# Patient Record
Sex: Female | Born: 1960 | Race: Black or African American | Hispanic: No | Smoking: Never smoker
Health system: Southern US, Community
[De-identification: ages and names within clinical notes are randomized; demographics above are authoritative.]

## PROBLEM LIST (undated history)

## (undated) DIAGNOSIS — G8929 Other chronic pain: Secondary | ICD-10-CM

## (undated) DIAGNOSIS — Z86718 Personal history of other venous thrombosis and embolism: Secondary | ICD-10-CM

## (undated) DIAGNOSIS — I10 Essential (primary) hypertension: Secondary | ICD-10-CM

## (undated) DIAGNOSIS — G4730 Sleep apnea, unspecified: Secondary | ICD-10-CM

## (undated) DIAGNOSIS — G43909 Migraine, unspecified, not intractable, without status migrainosus: Secondary | ICD-10-CM

## (undated) DIAGNOSIS — R0989 Other specified symptoms and signs involving the circulatory and respiratory systems: Secondary | ICD-10-CM

## (undated) DIAGNOSIS — G35 Multiple sclerosis: Secondary | ICD-10-CM

## (undated) DIAGNOSIS — E119 Type 2 diabetes mellitus without complications: Secondary | ICD-10-CM

## (undated) DIAGNOSIS — M48061 Spinal stenosis, lumbar region without neurogenic claudication: Secondary | ICD-10-CM

## (undated) DIAGNOSIS — M549 Dorsalgia, unspecified: Secondary | ICD-10-CM

## (undated) HISTORY — DX: Personal history of other venous thrombosis and embolism: Z86.718

## (undated) HISTORY — PX: FOOT SURGERY: SHX648

## (undated) HISTORY — PX: EYE SURGERY: SHX253

## (undated) HISTORY — DX: Spinal stenosis, lumbar region without neurogenic claudication: M48.061

## (undated) HISTORY — PX: ECTOPIC PREGNANCY SURGERY: SHX613

## (undated) HISTORY — DX: Migraine, unspecified, not intractable, without status migrainosus: G43.909

## (undated) HISTORY — DX: Essential (primary) hypertension: I10

## (undated) HISTORY — DX: Sleep apnea, unspecified: G47.30

## (undated) HISTORY — DX: Type 2 diabetes mellitus without complications: E11.9

## (undated) HISTORY — DX: Multiple sclerosis: G35

## (undated) HISTORY — DX: Other specified symptoms and signs involving the circulatory and respiratory systems: R09.89

## (undated) HISTORY — PX: OOPHORECTOMY: SHX86

## (undated) HISTORY — PX: ABDOMINAL HYSTERECTOMY: SHX81

## (undated) HISTORY — PX: BACK SURGERY: SHX140

---

## 2004-06-01 ENCOUNTER — Ambulatory Visit: Payer: Self-pay | Admitting: *Deleted

## 2004-10-11 ENCOUNTER — Emergency Department: Payer: Self-pay | Admitting: Unknown Physician Specialty

## 2004-12-11 ENCOUNTER — Emergency Department: Payer: Self-pay | Admitting: Emergency Medicine

## 2005-04-04 ENCOUNTER — Ambulatory Visit: Payer: Self-pay | Admitting: Neurology

## 2005-07-12 ENCOUNTER — Ambulatory Visit: Payer: Self-pay | Admitting: Unknown Physician Specialty

## 2005-10-07 ENCOUNTER — Emergency Department: Payer: Self-pay | Admitting: Emergency Medicine

## 2005-12-26 ENCOUNTER — Ambulatory Visit: Payer: Self-pay | Admitting: Nurse Practitioner

## 2006-04-11 ENCOUNTER — Emergency Department: Payer: Self-pay | Admitting: Emergency Medicine

## 2006-04-16 ENCOUNTER — Emergency Department: Payer: Self-pay | Admitting: Emergency Medicine

## 2006-05-23 ENCOUNTER — Ambulatory Visit: Payer: Self-pay | Admitting: Nurse Practitioner

## 2006-06-14 ENCOUNTER — Emergency Department: Payer: Self-pay | Admitting: Unknown Physician Specialty

## 2006-06-14 ENCOUNTER — Other Ambulatory Visit: Payer: Self-pay

## 2006-07-16 ENCOUNTER — Other Ambulatory Visit: Payer: Self-pay

## 2006-07-16 ENCOUNTER — Emergency Department: Payer: Self-pay | Admitting: Unknown Physician Specialty

## 2006-07-18 ENCOUNTER — Ambulatory Visit: Payer: Self-pay | Admitting: Unknown Physician Specialty

## 2006-09-10 ENCOUNTER — Ambulatory Visit: Payer: Self-pay | Admitting: Neurology

## 2007-01-14 ENCOUNTER — Emergency Department: Payer: Self-pay | Admitting: Emergency Medicine

## 2007-01-17 ENCOUNTER — Ambulatory Visit: Payer: Self-pay | Admitting: Emergency Medicine

## 2007-02-25 IMAGING — CT CT HEAD WITHOUT CONTRAST
2 series · 16 of 30 positions shown, 20 images · non-contrast
Comparison: none

REASON FOR EXAM: AMS, rm 4
COMMENTS:

[Series 2: without · axial · non-contrast · 0.40mm/px · z∈[+780,+904]mm · 13 of 31 slices shown, 17 images]
[im 3/31  brain]
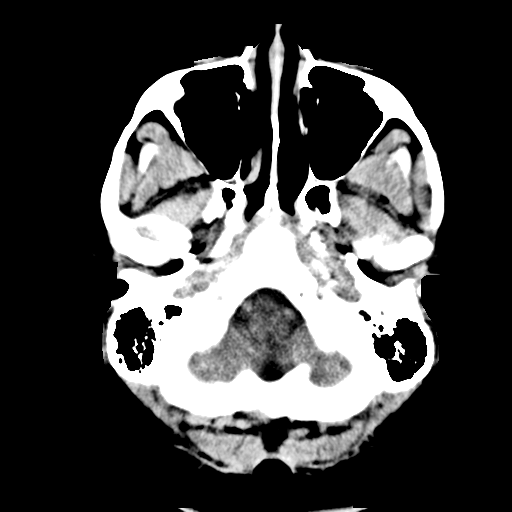
[im 3/31  bone]
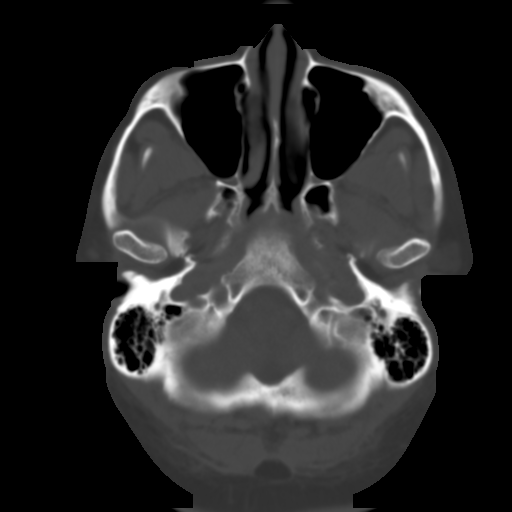
[im 5/31  brain]
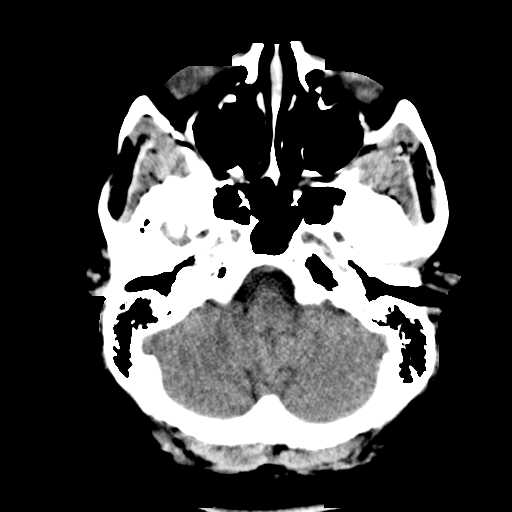
[im 7/31  brain]
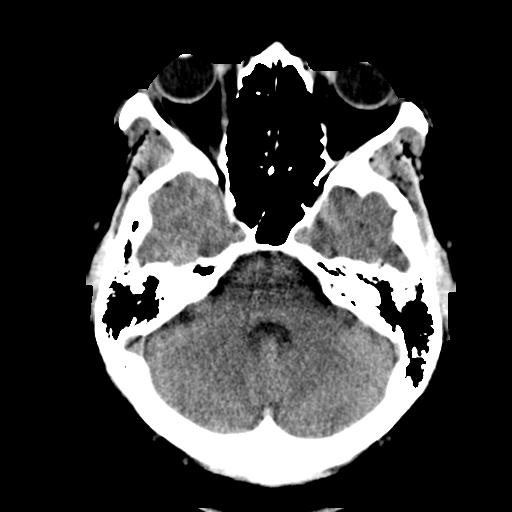
[im 9/31  brain]
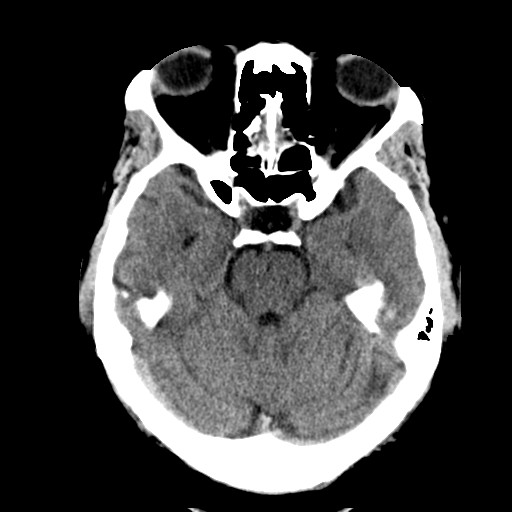
[im 11/31  brain]
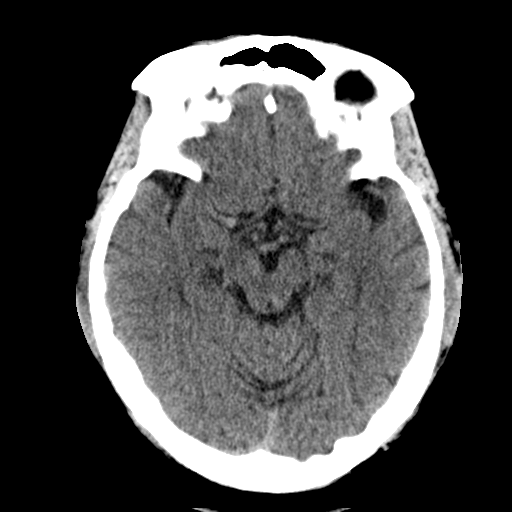
[im 11/31  bone]
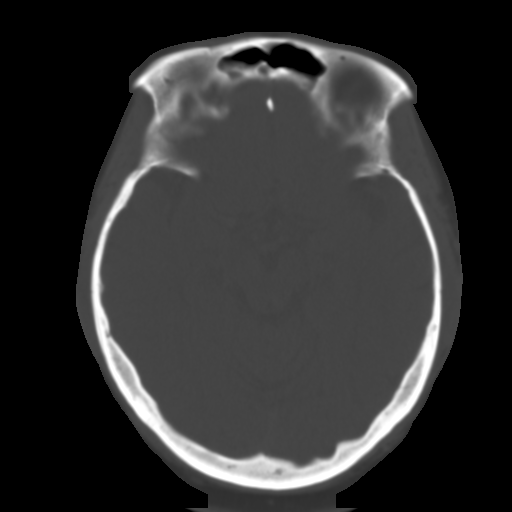
[im 13/31  brain]
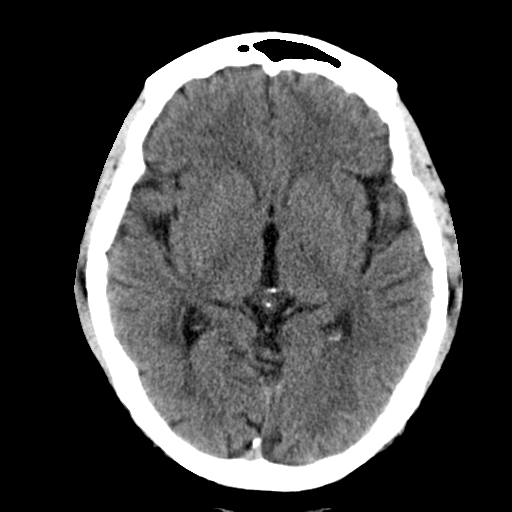
[im 16/31  brain]
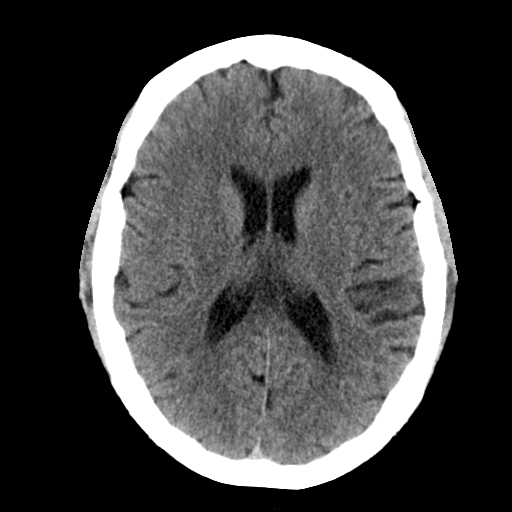
[im 18/31  brain]
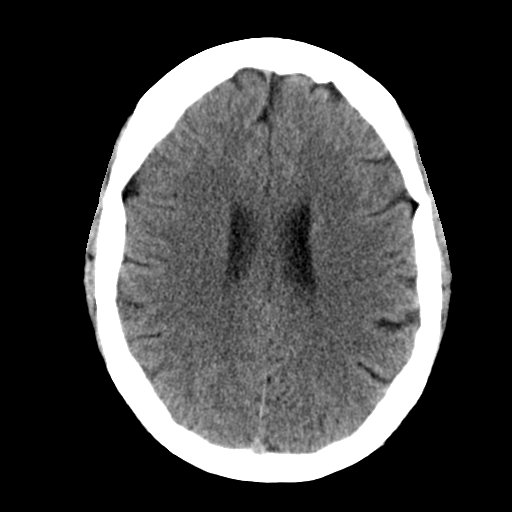
[im 20/31  brain]
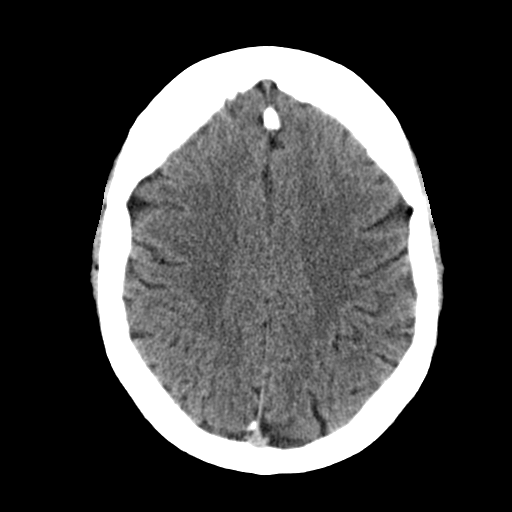
[im 20/31  bone]
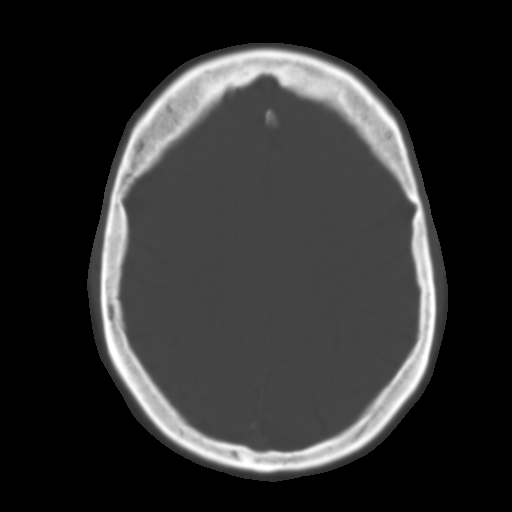
[im 22/31  brain]
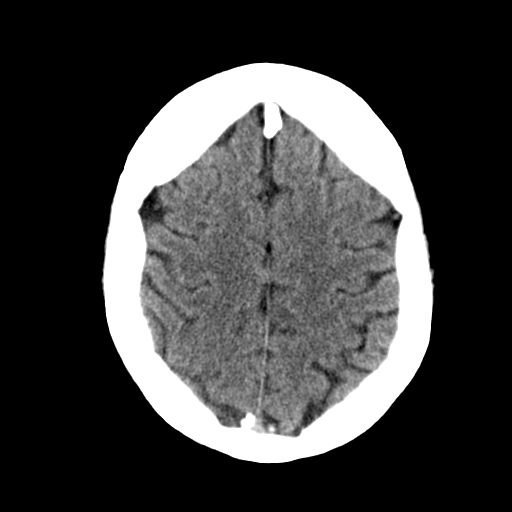
[im 24/31  brain]
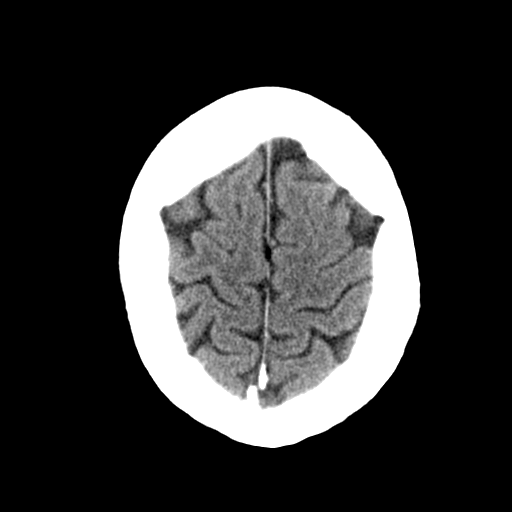
[im 26/31  brain]
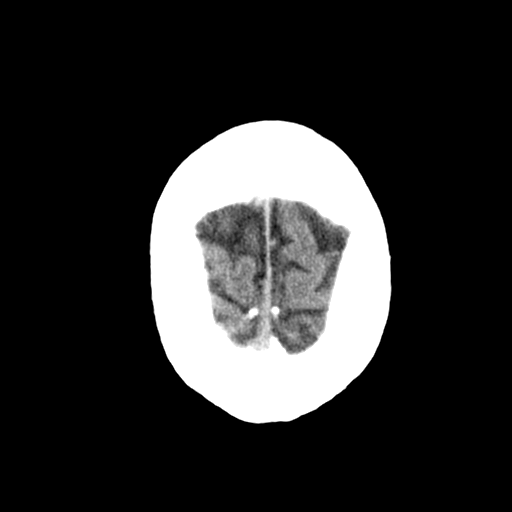
[im 28/31  brain]
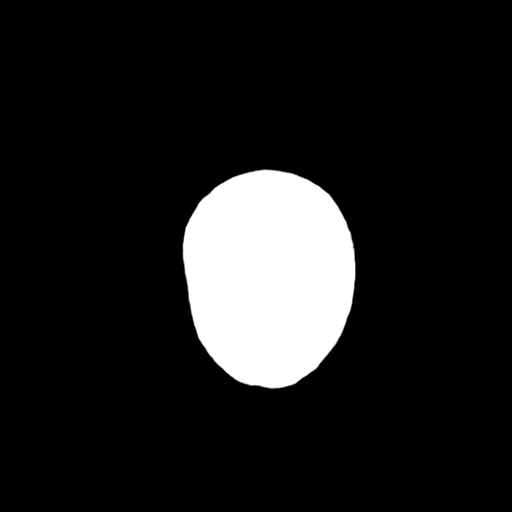
[im 28/31  bone]
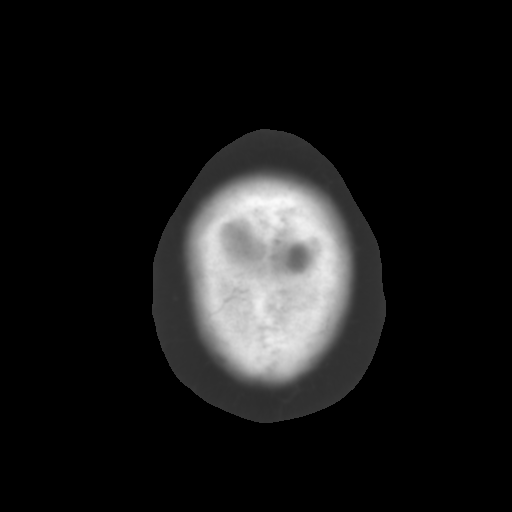

[Series 3: bone · axial · 0.40mm/px · z∈[+780,+820]mm · 3 of 31 slices shown]
[im 3/31  bone]
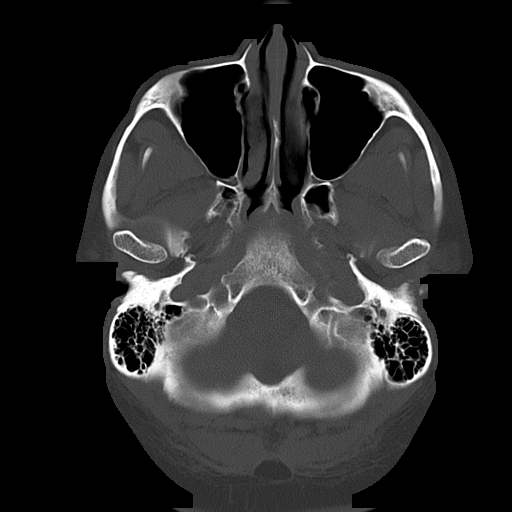
[im 7/31  bone]
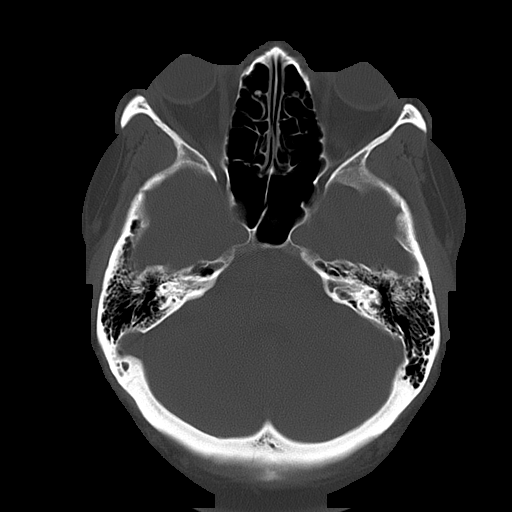
[im 11/31  bone]
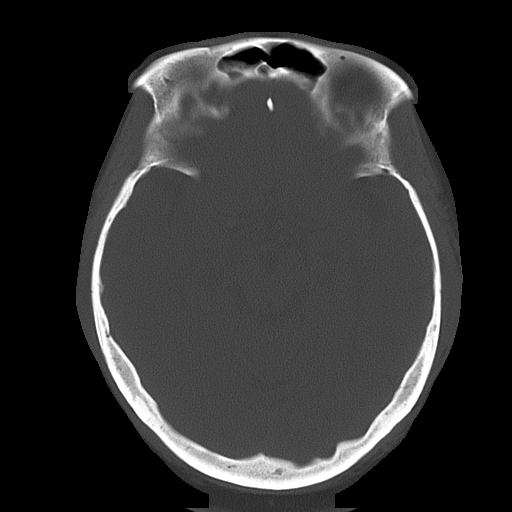

[16 of 30 positions shown; findings below may reference images not displayed]

PROCEDURE:     CT  - CT HEAD WITHOUT CONTRAST  - July 16, 2006  [DATE]

RESULT:        Comparison is made to a prior study dated 06/14/06.

There is no evidence of intraaxial nor extraaxial fluid collections or
evidence of acute hemorrhage.  No secondary signs are appreciated to suggest
mass effect, subacute or chronic infarction.  The visualized bony skeleton
evaluated with bone windowing demonstrates no evidence of fracture or
dislocation.
IMPRESSION: Unremarkable Head CT as described above.

Dr. Julio C of the Emergency Department was informed of these findings at the
time of the initial interpretation.

## 2007-02-25 IMAGING — CR DG CHEST 1V
1 series · 1 of 1 positions shown · non-contrast
Comparison: none

REASON FOR EXAM: dizziness weakness
COMMENTS:

PROCEDURE:     DXR - DXR CHEST 1 VIEWAP OR PA  - July 16, 2006  [DATE]
RESULT:     The lungs are mildly hyperinflated but are clear. The heart is
normal in size. The pulmonary vascularity is not engorged. The mediastinum
is not abnormally widened. I see no acute bony abnormality.

[view not recorded]
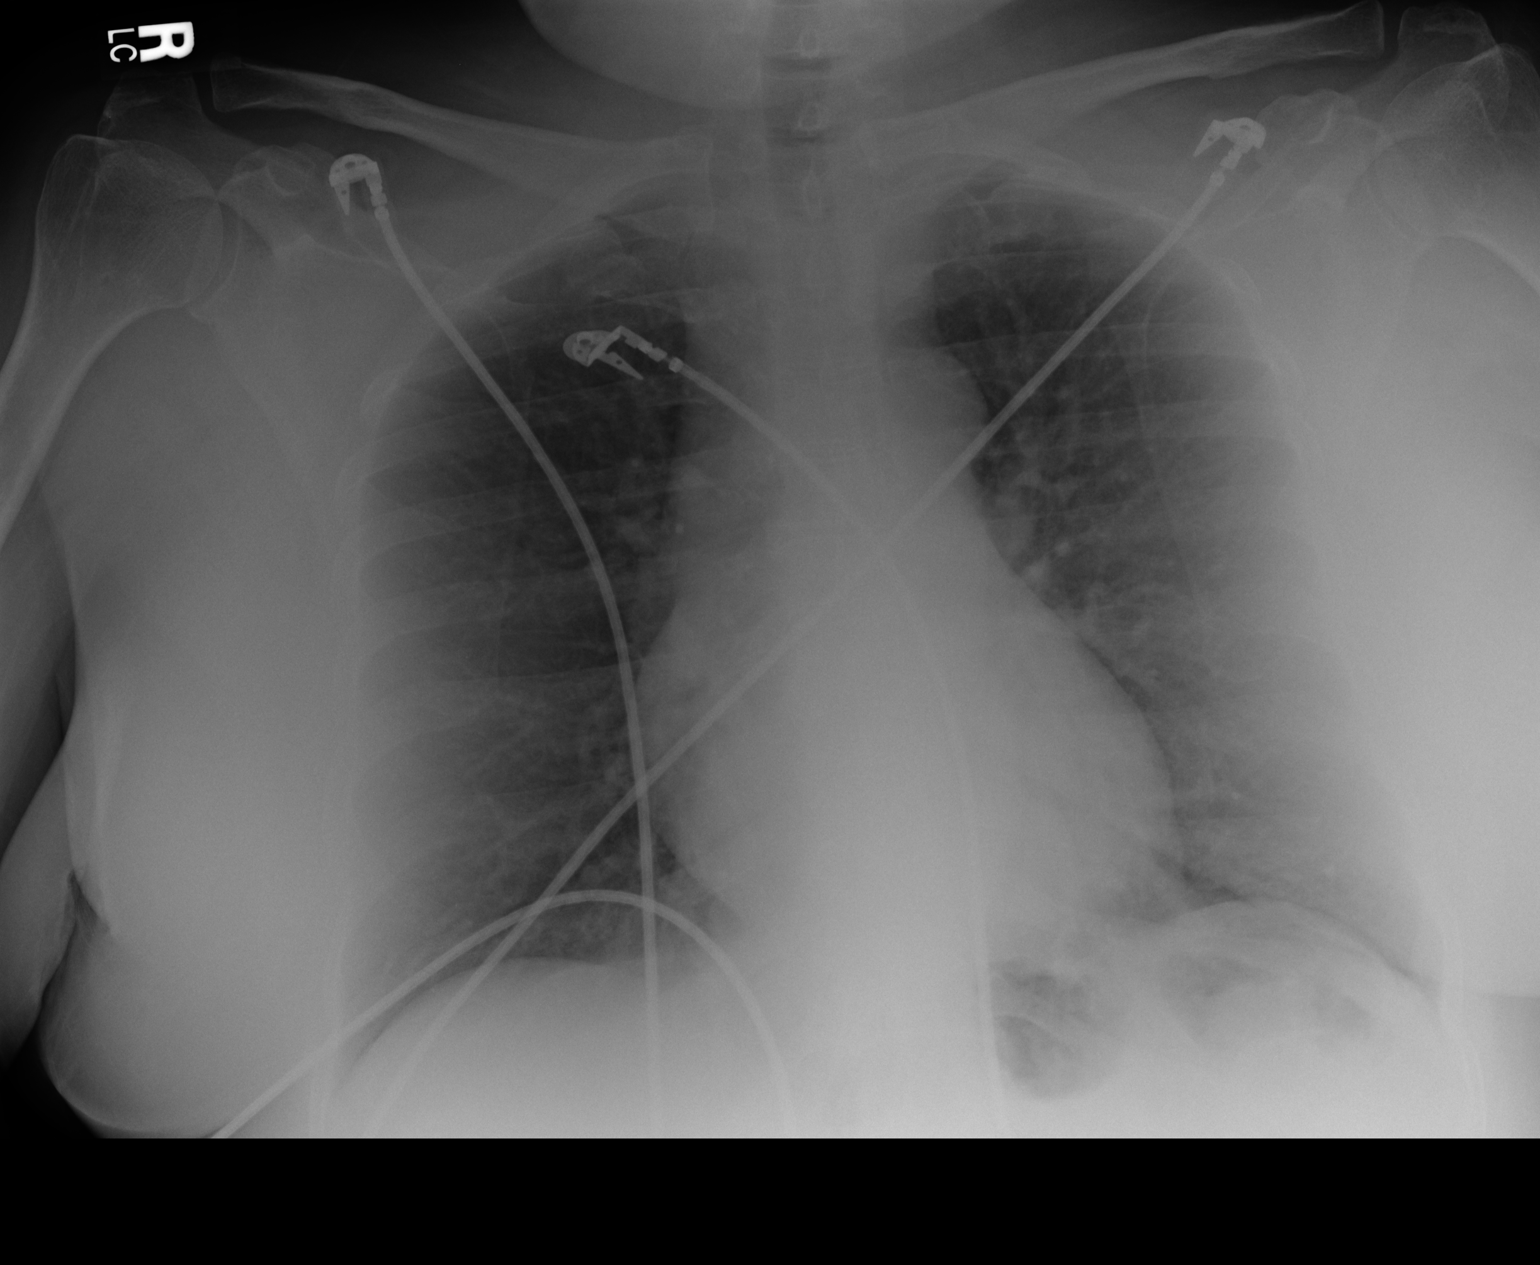

[1 of 1 positions shown; findings below may reference images not displayed]

IMPRESSION: 1)I do not see evidence of acute cardiopulmonary disease.  If the patient
has persistent symptoms a follow-up PA and lateral chest x-ray would be of
value.

## 2007-07-29 ENCOUNTER — Ambulatory Visit: Payer: Self-pay | Admitting: *Deleted

## 2007-08-21 ENCOUNTER — Emergency Department: Payer: Self-pay | Admitting: Emergency Medicine

## 2007-09-08 ENCOUNTER — Ambulatory Visit: Payer: Self-pay | Admitting: Family Medicine

## 2007-09-20 ENCOUNTER — Emergency Department: Payer: Self-pay | Admitting: Internal Medicine

## 2007-09-23 ENCOUNTER — Emergency Department: Payer: Self-pay | Admitting: Internal Medicine

## 2007-11-10 ENCOUNTER — Emergency Department: Payer: Self-pay | Admitting: Internal Medicine

## 2007-12-02 ENCOUNTER — Ambulatory Visit: Payer: Self-pay | Admitting: Pain Medicine

## 2007-12-09 ENCOUNTER — Ambulatory Visit: Payer: Self-pay | Admitting: Pain Medicine

## 2007-12-24 ENCOUNTER — Ambulatory Visit: Payer: Self-pay | Admitting: Physician Assistant

## 2007-12-30 ENCOUNTER — Ambulatory Visit: Payer: Self-pay | Admitting: Pain Medicine

## 2008-01-13 ENCOUNTER — Ambulatory Visit: Payer: Self-pay | Admitting: Pain Medicine

## 2008-01-20 ENCOUNTER — Ambulatory Visit: Payer: Self-pay | Admitting: Pain Medicine

## 2008-02-25 ENCOUNTER — Ambulatory Visit: Payer: Self-pay | Admitting: Physician Assistant

## 2008-04-01 ENCOUNTER — Ambulatory Visit: Payer: Self-pay | Admitting: Physician Assistant

## 2008-04-13 ENCOUNTER — Ambulatory Visit: Payer: Self-pay | Admitting: Pain Medicine

## 2008-04-28 ENCOUNTER — Ambulatory Visit: Payer: Self-pay | Admitting: Physician Assistant

## 2008-05-06 ENCOUNTER — Ambulatory Visit: Payer: Self-pay | Admitting: Pain Medicine

## 2008-05-24 ENCOUNTER — Ambulatory Visit: Payer: Self-pay | Admitting: Pain Medicine

## 2008-07-31 ENCOUNTER — Emergency Department: Payer: Self-pay | Admitting: Emergency Medicine

## 2008-10-03 ENCOUNTER — Emergency Department: Payer: Self-pay | Admitting: Emergency Medicine

## 2008-10-18 ENCOUNTER — Ambulatory Visit: Payer: Self-pay | Admitting: Pain Medicine

## 2008-10-26 ENCOUNTER — Ambulatory Visit: Payer: Self-pay | Admitting: Pain Medicine

## 2008-11-10 ENCOUNTER — Encounter: Payer: Self-pay | Admitting: Unknown Physician Specialty

## 2008-11-11 ENCOUNTER — Ambulatory Visit: Payer: Self-pay | Admitting: Physician Assistant

## 2008-12-04 ENCOUNTER — Encounter: Payer: Self-pay | Admitting: Unknown Physician Specialty

## 2009-01-04 ENCOUNTER — Encounter: Payer: Self-pay | Admitting: Unknown Physician Specialty

## 2009-08-29 ENCOUNTER — Ambulatory Visit: Payer: Self-pay | Admitting: Pain Medicine

## 2009-09-01 ENCOUNTER — Ambulatory Visit: Payer: Self-pay | Admitting: Pain Medicine

## 2009-09-12 ENCOUNTER — Ambulatory Visit: Payer: Self-pay | Admitting: Pain Medicine

## 2009-09-20 ENCOUNTER — Ambulatory Visit: Payer: Self-pay | Admitting: Pain Medicine

## 2009-10-14 ENCOUNTER — Ambulatory Visit: Payer: Self-pay | Admitting: Pain Medicine

## 2009-10-25 ENCOUNTER — Ambulatory Visit: Payer: Self-pay | Admitting: Pain Medicine

## 2009-11-10 ENCOUNTER — Ambulatory Visit: Payer: Self-pay | Admitting: Pain Medicine

## 2009-11-24 ENCOUNTER — Ambulatory Visit: Payer: Self-pay | Admitting: Unknown Physician Specialty

## 2009-11-29 ENCOUNTER — Emergency Department: Payer: Self-pay | Admitting: Emergency Medicine

## 2009-12-01 ENCOUNTER — Ambulatory Visit: Payer: Self-pay | Admitting: Unknown Physician Specialty

## 2009-12-06 ENCOUNTER — Ambulatory Visit: Payer: Self-pay | Admitting: Unknown Physician Specialty

## 2009-12-06 HISTORY — PX: BACK SURGERY: SHX140

## 2009-12-11 ENCOUNTER — Emergency Department: Payer: Self-pay

## 2010-04-30 ENCOUNTER — Emergency Department: Payer: Self-pay | Admitting: Emergency Medicine

## 2010-08-24 ENCOUNTER — Ambulatory Visit: Payer: Self-pay | Admitting: Unknown Physician Specialty

## 2010-09-15 ENCOUNTER — Ambulatory Visit: Payer: Self-pay | Admitting: Unknown Physician Specialty

## 2010-09-19 LAB — PATHOLOGY REPORT

## 2010-09-25 ENCOUNTER — Other Ambulatory Visit: Payer: Self-pay | Admitting: Pain Medicine

## 2010-09-25 ENCOUNTER — Ambulatory Visit: Payer: Self-pay | Admitting: Pain Medicine

## 2010-10-02 ENCOUNTER — Ambulatory Visit: Payer: Self-pay | Admitting: Pain Medicine

## 2010-10-25 ENCOUNTER — Ambulatory Visit: Payer: Self-pay | Admitting: Pain Medicine

## 2010-10-26 ENCOUNTER — Ambulatory Visit: Payer: Self-pay | Admitting: Pain Medicine

## 2010-11-09 ENCOUNTER — Ambulatory Visit: Payer: Self-pay | Admitting: Pain Medicine

## 2010-12-25 ENCOUNTER — Ambulatory Visit: Payer: Self-pay | Admitting: Pain Medicine

## 2010-12-26 ENCOUNTER — Ambulatory Visit: Payer: Self-pay | Admitting: Pain Medicine

## 2010-12-28 ENCOUNTER — Ambulatory Visit: Payer: Self-pay

## 2011-01-17 ENCOUNTER — Ambulatory Visit: Payer: Self-pay | Admitting: Pain Medicine

## 2011-01-25 ENCOUNTER — Emergency Department: Payer: Self-pay | Admitting: Emergency Medicine

## 2011-02-17 ENCOUNTER — Emergency Department: Payer: Self-pay | Admitting: Emergency Medicine

## 2011-04-18 ENCOUNTER — Other Ambulatory Visit: Payer: Self-pay | Admitting: Pain Medicine

## 2011-05-03 ENCOUNTER — Emergency Department: Payer: Self-pay | Admitting: Emergency Medicine

## 2011-10-17 ENCOUNTER — Ambulatory Visit: Payer: Self-pay | Admitting: Pain Medicine

## 2011-10-17 ENCOUNTER — Other Ambulatory Visit: Payer: Self-pay | Admitting: Pain Medicine

## 2011-11-12 ENCOUNTER — Ambulatory Visit: Payer: Self-pay | Admitting: Pain Medicine

## 2012-01-03 ENCOUNTER — Emergency Department: Payer: Self-pay | Admitting: Emergency Medicine

## 2012-02-12 ENCOUNTER — Ambulatory Visit: Payer: Self-pay | Admitting: Pain Medicine

## 2012-04-06 ENCOUNTER — Emergency Department: Payer: Self-pay | Admitting: Internal Medicine

## 2012-04-06 LAB — URINALYSIS, COMPLETE
Blood: NEGATIVE
Nitrite: NEGATIVE
Ph: 6 (ref 4.5–8.0)
Protein: NEGATIVE
Specific Gravity: 1.021 (ref 1.003–1.030)

## 2012-04-06 LAB — COMPREHENSIVE METABOLIC PANEL
Alkaline Phosphatase: 128 U/L (ref 50–136)
BUN: 18 mg/dL (ref 7–18)
Bilirubin,Total: 0.4 mg/dL (ref 0.2–1.0)
Chloride: 108 mmol/L — ABNORMAL HIGH (ref 98–107)
Creatinine: 0.76 mg/dL (ref 0.60–1.30)
Potassium: 3.9 mmol/L (ref 3.5–5.1)
SGOT(AST): 22 U/L (ref 15–37)
SGPT (ALT): 28 U/L (ref 12–78)
Total Protein: 8.6 g/dL — ABNORMAL HIGH (ref 6.4–8.2)

## 2012-04-06 LAB — CBC
HCT: 36.5 % (ref 35.0–47.0)
MCHC: 34.1 g/dL (ref 32.0–36.0)
Platelet: 365 10*3/uL (ref 150–440)
RDW: 14.1 % (ref 11.5–14.5)
WBC: 8.2 10*3/uL (ref 3.6–11.0)

## 2012-04-06 LAB — LIPASE, BLOOD: Lipase: 120 U/L (ref 73–393)

## 2012-05-12 ENCOUNTER — Ambulatory Visit: Payer: Self-pay | Admitting: Pain Medicine

## 2012-06-26 ENCOUNTER — Ambulatory Visit: Payer: Self-pay | Admitting: Pain Medicine

## 2012-07-14 ENCOUNTER — Ambulatory Visit: Payer: Self-pay | Admitting: Pain Medicine

## 2012-09-10 ENCOUNTER — Ambulatory Visit: Payer: Self-pay | Admitting: Pain Medicine

## 2012-11-27 ENCOUNTER — Ambulatory Visit: Payer: Self-pay | Admitting: Pain Medicine

## 2012-12-05 ENCOUNTER — Emergency Department (HOSPITAL_COMMUNITY): Payer: No Typology Code available for payment source

## 2012-12-05 ENCOUNTER — Emergency Department (HOSPITAL_COMMUNITY)
Admission: EM | Admit: 2012-12-05 | Discharge: 2012-12-05 | Disposition: A | Discharge status: Home / Self Care | Payer: No Typology Code available for payment source | Attending: Emergency Medicine | Admitting: Emergency Medicine

## 2012-12-05 DIAGNOSIS — W010XXA Fall on same level from slipping, tripping and stumbling without subsequent striking against object, initial encounter: Secondary | ICD-10-CM | POA: Insufficient documentation

## 2012-12-05 DIAGNOSIS — Y9289 Other specified places as the place of occurrence of the external cause: Secondary | ICD-10-CM | POA: Insufficient documentation

## 2012-12-05 DIAGNOSIS — Z79899 Other long term (current) drug therapy: Secondary | ICD-10-CM | POA: Insufficient documentation

## 2012-12-05 DIAGNOSIS — S5291XA Unspecified fracture of right forearm, initial encounter for closed fracture: Secondary | ICD-10-CM

## 2012-12-05 DIAGNOSIS — S5290XA Unspecified fracture of unspecified forearm, initial encounter for closed fracture: Secondary | ICD-10-CM | POA: Insufficient documentation

## 2012-12-05 DIAGNOSIS — Y9389 Activity, other specified: Secondary | ICD-10-CM | POA: Insufficient documentation

## 2012-12-05 MED ORDER — HYDROCODONE-ACETAMINOPHEN 5-325 MG PO TABS: 1.0000 | ORAL_TABLET | ORAL | Status: DC | PRN

## 2012-12-05 MED ORDER — HYDROCODONE-ACETAMINOPHEN 5-325 MG PO TABS
1.0000 | ORAL_TABLET | Freq: Once | ORAL | Status: AC
  Administered 2012-12-05: 1 via ORAL
  Filled 2012-12-05: qty 1

## 2012-12-05 NOTE — ED Notes (Signed)
Pt returns from xray

## 2012-12-05 NOTE — ED Provider Notes (Signed)
History  This chart was scribed for non-physician practitioner Francee Piccolo working with Lyanne Co, MD by Quintella Reichert, ED Scribe. This patient was seen in room WTR8/WTR8 and the patient's care was started at 6:54 PM .  CSN: [id number]  Arrival date & time 12/05/12  1827      Chief Complaint  Patient presents with  . Fall  . Wrist Pain     The history is provided by the patient. No language interpreter was used.   Gabriella Robinson is a 52 y.o. female who presents to the Emergency Department complaining of constant, severe, right wrist pain that began suddenly subsequent to a fall several hours ago.  Pt states she tripped while shopping and fell onto her right wrist.  She remembers fall, and denies LOC.  Pt states that pain began immediately subsequent to fall and that swelling began shortly afterward.  Currently pt rates pain as 9/10 in severity w/o associated numbness or tingling. Pt denies fever, chills, neck pain, sore throat, SOB, abdominal pain, nausea, emesis, diarrhea, urinary symptoms, weakness, numbness, or any other associated symptoms.  She denies prior h/o wrist injuries.      No past medical history on file.  No past surgical history on file.  No family history on file.  History  Substance Use Topics  . Smoking status: Not on file  . Smokeless tobacco: Not on file  . Alcohol Use: Not on file    OB History   No data available      Review of Systems  Constitutional: Negative for fever and chills.  HENT: Negative for sore throat and neck pain.   Eyes: Negative.   Respiratory: Negative.   Cardiovascular: Negative.   Gastrointestinal: Negative for nausea, vomiting, abdominal pain and diarrhea.  Genitourinary: Negative for dysuria and difficulty urinating.  Skin: Negative.   Neurological: Negative for weakness and numbness.  All other systems reviewed and are negative.    Allergies  Review of patient's allergies indicates no known  allergies.  Home Medications   Current Outpatient Rx  Name  Route  Sig  Dispense  Refill  . acetaminophen (TYLENOL) 500 MG tablet   Oral   Take 1,000 mg by mouth every 4 (four) hours as needed for pain.         . cyclobenzaprine (FLEXERIL) 10 MG tablet   Oral   Take 10 mg by mouth 3 (three) times daily as needed for muscle spasms.         Marland Kitchen esomeprazole (NEXIUM) 40 MG capsule   Oral   Take 40 mg by mouth daily before breakfast.         . gabapentin (NEURONTIN) 800 MG tablet   Oral   Take 800 mg by mouth 4 (four) times daily.         . interferon beta-1a (REBIF) 22 MCG/0.5ML injection   Subcutaneous   Inject 22 mcg into the skin 3 (three) times a week. Monday Wednesday and Friday         . lisinopril (PRINIVIL,ZESTRIL) 10 MG tablet   Oral   Take 10 mg by mouth daily.         Marland Kitchen oxyCODONE (OXYCONTIN) 10 MG 12 hr tablet   Oral   Take 10 mg by mouth every 12 (twelve) hours.         . pioglitazone-metformin (ACTOPLUS MET) 15-500 MG per tablet   Oral   Take 1 tablet by mouth 2 (two) times daily with a meal.         .  topiramate (TOPAMAX) 50 MG tablet   Oral   Take 50 mg by mouth 2 (two) times daily.         . Vitamin D, Ergocalciferol, (DRISDOL) 50000 UNITS CAPS   Oral   Take 50,000 Units by mouth every 7 (seven) days.           BP 92/67  Pulse 65  Temp(Src) 97.9 F (36.6 C)  Resp 20  SpO2 97%  Physical Exam  Nursing note and vitals reviewed. Constitutional: She is oriented to person, place, and time. She appears well-developed and well-nourished. No distress.  HENT:  Head: Normocephalic and atraumatic.  Eyes: EOM are normal. Pupils are equal, round, and reactive to light.  Neck: Normal range of motion. Neck supple. No tracheal deviation present.  Cardiovascular: Normal rate, regular rhythm and normal heart sounds.   Pulses:      Radial pulses are 2+ on the right side.  Pulmonary/Chest: Effort normal. No respiratory distress.   Musculoskeletal:       Right elbow: She exhibits decreased range of motion and swelling. She exhibits no effusion, no deformity and no laceration. Tenderness found.       Left elbow: Normal.       Right wrist: She exhibits decreased range of motion, tenderness and swelling. She exhibits no effusion, no crepitus, no deformity and no laceration.       Left wrist: Normal.       Arms:      Right hand: She exhibits decreased range of motion and tenderness. She exhibits normal two-point discrimination, normal capillary refill, no deformity, no laceration and no swelling. Normal sensation noted. Decreased strength noted. She exhibits wrist extension trouble.       Left hand: Normal.  No neurovascular deficit right arm/wrist  Neurological: She is alert and oriented to person, place, and time.  Skin: Skin is warm and dry.  Psychiatric: She has a normal mood and affect. Her behavior is normal.    ED Course  Procedures (including critical care time)  DIAGNOSTIC STUDIES: Oxygen Saturation is 97% on room air, normal by my interpretation.    COORDINATION OF CARE: 6:58 PM-Discussed treatment plan which includes pain medication and imaging with pt at bedside and pt agreed to plan.   Dr. Melvyn Novas consulted about break. Dr. Melvyn Novas request patient be placed in sugar tong split and follow up in his office next Thursday at 10:30AM.    Labs Reviewed - No data to display Dg Elbow Complete Right  12/05/2012  *RADIOLOGY REPORT*  Clinical Data: Fall  RIGHT ELBOW - COMPLETE 3+ VIEW  Comparison: None.  Findings: Four views of the right elbow submitted.  No acute fracture or subluxation.  No posterior fat pad sign.  IMPRESSION: No acute fracture or subluxation.  No posterior fat pad sign.   Original Report Authenticated By: Natasha Mead, M.D.    Dg Forearm Right  12/05/2012  *RADIOLOGY REPORT*  Clinical Data: Fall, wrist pain  RIGHT FOREARM - 2 VIEW  Comparison: None.  Findings: Two views of the right forearm  submitted.  There is displaced fracture of distal right radius.  Displaced fracture of the ulnar styloid.  IMPRESSION: Displaced fracture of distal right radius.  Displaced fracture of the ulnar styloid.   Original Report Authenticated By: Natasha Mead, M.D.    Dg Hand Complete Right  12/05/2012  *RADIOLOGY REPORT*  Clinical Data: Fall, wrist pain  RIGHT HAND - COMPLETE 3+ VIEW  Comparison: None.  Findings: Three views of the  right hand submitted.  There is oblique mild displaced comminuted fracture of distal right radius. Displaced fracture of the ulnar styloid.  IMPRESSION: There is oblique mild displaced comminuted fracture of distal right radius.  Displaced fracture of the ulnar styloid.   Original Report Authenticated By: Natasha Mead, M.D.      1. Right radial fracture, closed, initial encounter       MDM  Patient X-Ray positive for closed displaced radial fracture. No neurovascular deficits or obvious deformities on PE. Dr. Melvyn Novas was consulted and requested patient be placed in sugar tong splint and follow up in his office on Thursday at 10:30. Pain managed in ED. Pt advised to follow up with orthopedics. Patient given brace while in ED, conservative therapy recommended and discussed. Patient will be dc home & is agreeable with above plan. Patient is stable at time of discharge.      I personally performed the services described in this documentation, which was scribed in my presence. The recorded information has been reviewed and is accurate.          Jeannetta Ellis, PA-C 12/06/12 0116

## 2012-12-05 NOTE — ED Notes (Signed)
Ortho bedside

## 2012-12-05 NOTE — ED Notes (Signed)
Pt reports she fell in [place]. C/o right wrist pain.

## 2012-12-08 NOTE — ED Provider Notes (Signed)
Medical screening examination/treatment/procedure(s) were performed by non-physician practitioner and as supervising physician I was immediately available for consultation/collaboration.  Lyanne Co, MD 12/08/12 [phone]

## 2012-12-25 ENCOUNTER — Ambulatory Visit: Payer: Self-pay | Admitting: Pain Medicine

## 2013-01-07 ENCOUNTER — Ambulatory Visit: Payer: Self-pay | Admitting: Pain Medicine

## 2013-01-13 ENCOUNTER — Other Ambulatory Visit: Payer: Self-pay | Admitting: Pain Medicine

## 2013-01-13 LAB — MAGNESIUM: Magnesium: 1.6 mg/dL — ABNORMAL LOW

## 2013-02-16 ENCOUNTER — Encounter: Payer: Self-pay | Admitting: Nurse Practitioner

## 2013-03-06 ENCOUNTER — Encounter: Payer: Self-pay | Admitting: Nurse Practitioner

## 2013-06-11 ENCOUNTER — Ambulatory Visit: Payer: Self-pay | Admitting: Podiatry

## 2013-06-15 ENCOUNTER — Encounter: Payer: Self-pay | Admitting: Podiatry

## 2013-06-15 ENCOUNTER — Ambulatory Visit (INDEPENDENT_AMBULATORY_CARE_PROVIDER_SITE_OTHER): Payer: Medicare Other | Admitting: Podiatry

## 2013-06-15 VITALS — BP 127/74 | HR 98 | Resp 16 | Ht 68.0 in | Wt 280.0 lb

## 2013-06-15 DIAGNOSIS — L600 Ingrowing nail: Secondary | ICD-10-CM

## 2013-06-15 NOTE — Progress Notes (Signed)
Gabriella Robinson presents today as a 52 year old black female with a chief concern of a splitting toenail to the hallux right. She states of the nail just peeled off and on concerned.  Objective: Vital signs are stable she is alert and oriented x3. The nail plate to the hallux right does appear to be relatively normal it appears that a piece of the nail did split off but is the very distal most age of the nail this very well could of been dystrophy or traumatic regrowth. This also could be nail mycoses.  Assessment dystrophic nail hallux right.  Plan: Trim the nail back for her today we will followup with her as needed appear

## 2013-11-12 DIAGNOSIS — M76899 Other specified enthesopathies of unspecified lower limb, excluding foot: Secondary | ICD-10-CM | POA: Insufficient documentation

## 2013-11-12 DIAGNOSIS — M48061 Spinal stenosis, lumbar region without neurogenic claudication: Secondary | ICD-10-CM

## 2013-11-12 DIAGNOSIS — G629 Polyneuropathy, unspecified: Secondary | ICD-10-CM | POA: Insufficient documentation

## 2013-11-12 HISTORY — DX: Spinal stenosis, lumbar region without neurogenic claudication: M48.061

## 2013-12-03 ENCOUNTER — Ambulatory Visit: Payer: Self-pay | Admitting: Pain Medicine

## 2013-12-14 ENCOUNTER — Ambulatory Visit: Payer: Self-pay | Admitting: Pain Medicine

## 2013-12-17 ENCOUNTER — Ambulatory Visit: Payer: Self-pay | Admitting: Pain Medicine

## 2013-12-31 ENCOUNTER — Ambulatory Visit: Payer: Self-pay | Admitting: Pain Medicine

## 2014-01-11 ENCOUNTER — Ambulatory Visit: Payer: Self-pay | Admitting: Pain Medicine

## 2014-01-14 ENCOUNTER — Ambulatory Visit: Payer: Self-pay | Admitting: Pain Medicine

## 2014-02-03 ENCOUNTER — Ambulatory Visit: Payer: Self-pay | Admitting: Pain Medicine

## 2014-03-05 ENCOUNTER — Ambulatory Visit: Payer: Self-pay | Admitting: Pain Medicine

## 2014-04-27 ENCOUNTER — Emergency Department: Payer: Self-pay | Admitting: Emergency Medicine

## 2014-06-21 ENCOUNTER — Ambulatory Visit: Payer: Self-pay | Admitting: Pain Medicine

## 2014-10-04 DIAGNOSIS — G35 Multiple sclerosis: Secondary | ICD-10-CM | POA: Insufficient documentation

## 2014-10-04 DIAGNOSIS — R202 Paresthesia of skin: Secondary | ICD-10-CM | POA: Insufficient documentation

## 2014-10-04 DIAGNOSIS — G479 Sleep disorder, unspecified: Secondary | ICD-10-CM | POA: Insufficient documentation

## 2015-03-14 ENCOUNTER — Emergency Department
Admission: EM | Admit: 2015-03-14 | Discharge: 2015-03-14 | Disposition: A | Discharge status: Home / Self Care | Payer: Medicare Other | Attending: Emergency Medicine | Admitting: Emergency Medicine

## 2015-03-14 ENCOUNTER — Encounter: Payer: Self-pay | Admitting: Emergency Medicine

## 2015-03-14 DIAGNOSIS — Z79899 Other long term (current) drug therapy: Secondary | ICD-10-CM | POA: Diagnosis not present

## 2015-03-14 DIAGNOSIS — R140 Abdominal distension (gaseous): Secondary | ICD-10-CM | POA: Diagnosis not present

## 2015-03-14 DIAGNOSIS — E1165 Type 2 diabetes mellitus with hyperglycemia: Secondary | ICD-10-CM | POA: Insufficient documentation

## 2015-03-14 DIAGNOSIS — R300 Dysuria: Secondary | ICD-10-CM | POA: Diagnosis present

## 2015-03-14 DIAGNOSIS — R739 Hyperglycemia, unspecified: Secondary | ICD-10-CM

## 2015-03-14 LAB — CBC
HCT: 39.7 % (ref 35.0–47.0)
HEMOGLOBIN: 13.1 g/dL (ref 12.0–16.0)
MCH: 29.1 pg (ref 26.0–34.0)
MCHC: 33 g/dL (ref 32.0–36.0)
MCV: 88.1 fL (ref 80.0–100.0)
PLATELETS: 328 10*3/uL (ref 150–440)
RBC: 4.5 MIL/uL (ref 3.80–5.20)
RDW: 13.7 % (ref 11.5–14.5)
WBC: 9.5 10*3/uL (ref 3.6–11.0)

## 2015-03-14 LAB — COMPREHENSIVE METABOLIC PANEL
ALT: 51 U/L (ref 14–54)
AST: 47 U/L — ABNORMAL HIGH (ref 15–41)
Albumin: 3.9 g/dL (ref 3.5–5.0)
Alkaline Phosphatase: 101 U/L (ref 38–126)
Anion gap: 11 (ref 5–15)
BILIRUBIN TOTAL: 0.5 mg/dL (ref 0.3–1.2)
BUN: 11 mg/dL (ref 6–20)
CO2: 23 mmol/L (ref 22–32)
Calcium: 9.8 mg/dL (ref 8.9–10.3)
Chloride: 98 mmol/L — ABNORMAL LOW (ref 101–111)
Creatinine, Ser: 0.66 mg/dL (ref 0.44–1.00)
GFR calc Af Amer: >60 mL/min (ref >60)
GFR calc non Af Amer: >60 mL/min (ref >60)
Glucose, Bld: 356 mg/dL — ABNORMAL HIGH (ref 65–99)
Potassium: 4.1 mmol/L (ref 3.5–5.1)
Sodium: 132 mmol/L — ABNORMAL LOW (ref 135–145)
TOTAL PROTEIN: 7.9 g/dL (ref 6.5–8.1)

## 2015-03-14 LAB — GLUCOSE, CAPILLARY: GLUCOSE-CAPILLARY: 283 mg/dL — AB (ref 65–99)

## 2015-03-14 LAB — URINALYSIS COMPLETE WITH MICROSCOPIC (ARMC ONLY)
BILIRUBIN URINE: NEGATIVE
Bacteria, UA: NONE SEEN
HGB URINE DIPSTICK: NEGATIVE
KETONES UR: NEGATIVE mg/dL
LEUKOCYTES UA: NEGATIVE
Nitrite: NEGATIVE
PH: 8 (ref 5.0–8.0)
Protein, ur: NEGATIVE mg/dL
RBC / HPF: NONE SEEN RBC/hpf (ref 0–5)
SPECIFIC GRAVITY, URINE: 1.011 (ref 1.005–1.030)

## 2015-03-14 MED ORDER — SODIUM CHLORIDE 0.9 % IV SOLN
1000.0000 mL | Freq: Once | INTRAVENOUS | Status: AC
  Administered 2015-03-14: 1000 mL via INTRAVENOUS

## 2015-03-14 NOTE — Discharge Instructions (Signed)

## 2015-03-14 NOTE — ED Provider Notes (Signed)
[facility] Emergency Department Provider Note  ____________________________________________  Time seen: 4 PM  I have reviewed the triage vital signs and the nursing notes.   HISTORY  Chief Complaint Dysuria and Bloated    HPI Gabriella Robinson is a 54 y.o. female who complains of frequent urination. Today she noticed some mild dysuria. She also reports that she feels somewhat bloated she is unsure why that is. She denies abdominal pain upon speaking with the patient she is eating cookies. She does admit to a recent diagnosis of diabetes and reports compliance with her medications. She denies shortness of breath. No lower extremity edema.     Past Medical History  Diagnosis Date  . Diabetes   . MS (multiple sclerosis)   . HBP (high blood pressure)   . Sinus complaint   . History of blood clots   . Migraines   . Sleep apnea     There are no active problems to display for this patient.   Past Surgical History  Procedure Laterality Date  . Eye surgery Bilateral   . Ectopic pregnancy surgery    . Abdominal hysterectomy    . Back surgery    . Foot surgery Bilateral     Current Outpatient Rx  Name  Route  Sig  Dispense  Refill  . acetaminophen (TYLENOL) 500 MG tablet   Oral   Take 1,000 mg by mouth every 4 (four) hours as needed for pain.         . cyclobenzaprine (FLEXERIL) 10 MG tablet   Oral   Take 10 mg by mouth 3 (three) times daily as needed for muscle spasms.         Marland Kitchen esomeprazole (NEXIUM) 40 MG capsule   Oral   Take 40 mg by mouth daily before breakfast.         . gabapentin (NEURONTIN) 800 MG tablet   Oral   Take 800 mg by mouth 4 (four) times daily.         Marland Kitchen HYDROcodone-acetaminophen (NORCO/VICODIN) 5-325 MG per tablet   Oral   Take 1 tablet by mouth every 4 (four) hours as needed for pain.   20 tablet   0   . interferon beta-1a (REBIF) 22 MCG/0.5ML injection   Subcutaneous   Inject 22 mcg into the skin 3 (three)  times a week. Monday Wednesday and Friday         . lisinopril (PRINIVIL,ZESTRIL) 10 MG tablet   Oral   Take 10 mg by mouth daily.         Marland Kitchen oxyCODONE (OXYCONTIN) 10 MG 12 hr tablet   Oral   Take 10 mg by mouth every 12 (twelve) hours.         . pioglitazone-metformin (ACTOPLUS MET) 15-500 MG per tablet   Oral   Take 1 tablet by mouth 2 (two) times daily with a meal.         . topiramate (TOPAMAX) 50 MG tablet   Oral   Take 50 mg by mouth 2 (two) times daily.         . Vitamin D, Ergocalciferol, (DRISDOL) 50000 UNITS CAPS   Oral   Take 50,000 Units by mouth every 7 (seven) days.           Allergies Review of patient's allergies indicates no known allergies.  History reviewed. No pertinent family history.  Social History History  Substance Use Topics  . Smoking status: Never Smoker   . Smokeless  tobacco: Never Used  . Alcohol Use: No    Review of Systems  Constitutional: Negative for fever. Eyes: Negative for visual changes. ENT: Negative for sore throat Cardiovascular: Negative for chest pain. Respiratory: Negative for shortness of breath. Gastrointestinal: Negative for abdominal pain, vomiting and diarrhea. Genitourinary: Positive for dysuria Musculoskeletal: Negative for back pain. Skin: Negative for rash. Neurological: Negative for headaches or focal weakness Psychiatric: No anxiety    ____________________________________________   PHYSICAL EXAM:  VITAL SIGNS: ED Triage Vitals  Enc Vitals Group     BP 03/14/15 1553 143/98 mmHg     Pulse Rate 03/14/15 1553 120     Resp 03/14/15 1553 18     Temp 03/14/15 1553 98.3 F (36.8 C)     Temp Source 03/14/15 1553 Oral     SpO2 03/14/15 1553 100 %     Weight 03/14/15 1553 274 lb (124.286 kg)     Height 03/14/15 1553 5' 8"  (1.727 m)     Head Cir --      Peak Flow --      Pain Score --      Pain Loc --      Pain Edu? --      Excl. in [place]? --      Constitutional: Alert and oriented. Well  appearing and in no distress. Eyes: Conjunctivae are normal.  ENT   Head: Normocephalic and atraumatic.   Mouth/Throat: Mucous membranes are moist. Cardiovascular: Normal rate, regular rhythm. Normal and symmetric distal pulses are present in all extremities. No murmurs, rubs, or gallops. Respiratory: Normal respiratory effort without tachypnea nor retractions. Breath sounds are clear and equal bilaterally.  Gastrointestinal: Soft and non-tender in all quadrants. No distention. There is no CVA tenderness. Benign exam Genitourinary: deferred Musculoskeletal: Nontender with normal range of motion in all extremities. No lower extremity tenderness nor edema. Neurologic:  Normal speech and language. No gross focal neurologic deficits are appreciated. Skin:  Skin is warm, dry and intact. No rash noted. Psychiatric: Mood and affect are normal. Patient exhibits appropriate insight and judgment.  ____________________________________________    LABS (pertinent positives/negatives)  Labs Reviewed  URINALYSIS COMPLETEWITH MICROSCOPIC ([facility] ONLY) - Abnormal; Notable for the following:    Color, Urine YELLOW (*)    APPearance CLOUDY (*)    Glucose, UA >500 (*)    Squamous Epithelial / LPF 0-5 (*)    All other components within normal limits  COMPREHENSIVE METABOLIC PANEL - Abnormal; Notable for the following:    Sodium 132 (*)    Chloride 98 (*)    Glucose, Bld 356 (*)    AST 47 (*)    All other components within normal limits  GLUCOSE, CAPILLARY - Abnormal; Notable for the following:    Glucose-Capillary 283 (*)    All other components within normal limits  CBC    ____________________________________________   EKG  None  ____________________________________________    RADIOLOGY I have personally reviewed any xrays that were ordered on this patient: None  ____________________________________________   PROCEDURES  Procedure(s) performed: none  Critical Care  performed: none  ____________________________________________   INITIAL IMPRESSION / ASSESSMENT AND PLAN / ED COURSE  Pertinent labs & imaging results that were available during my care of the patient were reviewed by me and considered in my medical decision making (see chart for details).  Suspect patient's urine frequency is related to elevated blood sugars. Her arrival blood sugar was in the mid 200s after eating cookies to cut up to 300.  Her anion gap is normal. Her urinalysis does not show infection. I educated her regarding diabetes and diet adjustment. She will follow-up with her PCP and continue to take her medication as instructed  ____________________________________________   FINAL CLINICAL IMPRESSION(S) / ED DIAGNOSES  Final diagnoses:  Hyperglycemia     Lavonia Drafts, MD 03/14/15 2038

## 2015-03-14 NOTE — ED Notes (Signed)
Pt reports she has had increased urinary frequency that is starting to become painful. States she is also feeling bloated and is unable to eat.  She says she is hungry but can't eat.  Pt is diaphoretic but states she has been diaphoretic since she has been diagnosed with diabetes.  Reports her current size is the biggest she has been.  No LE edema noted.  Stomach is distended.

## 2015-03-22 ENCOUNTER — Other Ambulatory Visit: Payer: Self-pay | Admitting: Specialist

## 2015-04-21 DIAGNOSIS — G43009 Migraine without aura, not intractable, without status migrainosus: Secondary | ICD-10-CM | POA: Insufficient documentation

## 2015-04-26 ENCOUNTER — Ambulatory Visit: Payer: Medicare Other

## 2015-06-09 DIAGNOSIS — G5600 Carpal tunnel syndrome, unspecified upper limb: Secondary | ICD-10-CM | POA: Insufficient documentation

## 2015-06-15 ENCOUNTER — Encounter: Payer: Self-pay | Admitting: Podiatry

## 2015-06-15 ENCOUNTER — Ambulatory Visit (INDEPENDENT_AMBULATORY_CARE_PROVIDER_SITE_OTHER): Payer: Medicare Other

## 2015-06-15 ENCOUNTER — Ambulatory Visit (INDEPENDENT_AMBULATORY_CARE_PROVIDER_SITE_OTHER): Payer: Medicare Other | Admitting: Podiatry

## 2015-06-15 VITALS — BP 124/66 | HR 116 | Resp 16

## 2015-06-15 DIAGNOSIS — M79672 Pain in left foot: Secondary | ICD-10-CM

## 2015-06-15 DIAGNOSIS — M779 Enthesopathy, unspecified: Secondary | ICD-10-CM | POA: Diagnosis not present

## 2015-06-15 MED ORDER — MELOXICAM 15 MG PO TABS: 15.0000 mg | ORAL_TABLET | Freq: Every day | ORAL | Status: DC

## 2015-06-15 NOTE — Progress Notes (Signed)
She presents today for 4 month duration of pain to the lateral aspect of her left foot. She denies any trauma. She states the medial aspect of the foot is no longer painful at the lateral aspect right in here and she points to the peroneal tendons his swollen painful.  Objective: Vital signs are stable she is alert and oriented 3 at have reviewed her past medical history medications allergy surgeries and social history. Pulses are strongly palpable. Moderate edema over the lateral ankle. She has pain on eversion against resistance as well as eversion and plantarflexion against resistance. She has tenderness on direct palpation of the peroneal tendons as they coursed beneath the lateral malleolus. Radiographs do not demonstrate any type of osseus abnormalities in this area.  Assessment: Peroneal tendinitis left.  Plan: Injected today with dexamethasone and local anesthetic left foot. Placed her in a Cam Walker. I refilled her meloxicam and she will start 15 mg 1 by mouth daily. Follow-up with her in 1 month. An MRI may be necessary at that time.

## 2015-07-13 ENCOUNTER — Encounter: Payer: Self-pay | Admitting: Podiatry

## 2015-07-13 ENCOUNTER — Ambulatory Visit (INDEPENDENT_AMBULATORY_CARE_PROVIDER_SITE_OTHER): Payer: Medicare Other | Admitting: Podiatry

## 2015-07-13 VITALS — BP 124/66 | HR 116 | Resp 16

## 2015-07-13 DIAGNOSIS — M779 Enthesopathy, unspecified: Secondary | ICD-10-CM

## 2015-07-13 MED ORDER — MELOXICAM 15 MG PO TABS: 15.0000 mg | ORAL_TABLET | Freq: Every day | ORAL | Status: DC

## 2015-07-13 NOTE — Progress Notes (Signed)
She presents today for follow-up of her peroneal tendinitis. She states that he certainly feels much better than it did. He continues to wear the Cam Walker.  Objective: Vital signs are stable she is alert and oriented 3. She has pain on palpation of the peroneal brevis against resistance just beneath the lateral malleolus. I am able to feel fluctuance within the tendon sheath.  Assessment: Tenosynovitis of the peroneal brevis tendon left.  Plan: Discussed etiology pathology conservative versus surgical therapies. I placed her in a Tri-Lock brace and I will allow her to continue to use the Cam Walker if necessary. Otherwise I will follow up with her in 1 month at which time an MRI may be necessary.

## 2015-07-21 ENCOUNTER — Ambulatory Visit: Payer: Medicare Other | Attending: Pain Medicine | Admitting: Pain Medicine

## 2015-07-21 ENCOUNTER — Other Ambulatory Visit: Payer: Self-pay | Admitting: Pain Medicine

## 2015-07-21 ENCOUNTER — Encounter: Payer: Self-pay | Admitting: Pain Medicine

## 2015-07-21 VITALS — BP 130/79 | HR 117 | Temp 98.3°F | Resp 18 | Ht 68.5 in | Wt 258.0 lb

## 2015-07-21 DIAGNOSIS — G8929 Other chronic pain: Secondary | ICD-10-CM | POA: Diagnosis not present

## 2015-07-21 DIAGNOSIS — M797 Fibromyalgia: Secondary | ICD-10-CM

## 2015-07-21 DIAGNOSIS — F1190 Opioid use, unspecified, uncomplicated: Secondary | ICD-10-CM | POA: Diagnosis not present

## 2015-07-21 DIAGNOSIS — M549 Dorsalgia, unspecified: Secondary | ICD-10-CM | POA: Diagnosis present

## 2015-07-21 DIAGNOSIS — G5600 Carpal tunnel syndrome, unspecified upper limb: Secondary | ICD-10-CM | POA: Insufficient documentation

## 2015-07-21 DIAGNOSIS — Z79899 Other long term (current) drug therapy: Secondary | ICD-10-CM | POA: Diagnosis not present

## 2015-07-21 DIAGNOSIS — G35 Multiple sclerosis: Secondary | ICD-10-CM | POA: Diagnosis not present

## 2015-07-21 DIAGNOSIS — M792 Neuralgia and neuritis, unspecified: Secondary | ICD-10-CM

## 2015-07-21 DIAGNOSIS — M542 Cervicalgia: Secondary | ICD-10-CM | POA: Insufficient documentation

## 2015-07-21 DIAGNOSIS — M5412 Radiculopathy, cervical region: Secondary | ICD-10-CM

## 2015-07-21 DIAGNOSIS — M4806 Spinal stenosis, lumbar region: Secondary | ICD-10-CM | POA: Insufficient documentation

## 2015-07-21 DIAGNOSIS — G2581 Restless legs syndrome: Secondary | ICD-10-CM | POA: Insufficient documentation

## 2015-07-21 DIAGNOSIS — M7918 Myalgia, other site: Secondary | ICD-10-CM | POA: Insufficient documentation

## 2015-07-21 DIAGNOSIS — E669 Obesity, unspecified: Secondary | ICD-10-CM | POA: Diagnosis not present

## 2015-07-21 DIAGNOSIS — Z79891 Long term (current) use of opiate analgesic: Secondary | ICD-10-CM | POA: Diagnosis not present

## 2015-07-21 DIAGNOSIS — G629 Polyneuropathy, unspecified: Secondary | ICD-10-CM | POA: Insufficient documentation

## 2015-07-21 DIAGNOSIS — F1120 Opioid dependence, uncomplicated: Secondary | ICD-10-CM

## 2015-07-21 DIAGNOSIS — M791 Myalgia: Secondary | ICD-10-CM

## 2015-07-21 DIAGNOSIS — M545 Low back pain: Secondary | ICD-10-CM | POA: Diagnosis not present

## 2015-07-21 DIAGNOSIS — M48061 Spinal stenosis, lumbar region without neurogenic claudication: Secondary | ICD-10-CM

## 2015-07-21 MED ORDER — GABAPENTIN 800 MG PO TABS: 800.0000 mg | ORAL_TABLET | Freq: Four times a day (QID) | ORAL | Status: DC

## 2015-07-21 MED ORDER — OXYCODONE HCL 10 MG PO TABS: 10.0000 mg | ORAL_TABLET | Freq: Four times a day (QID) | ORAL | Status: DC | PRN

## 2015-07-21 MED ORDER — TIZANIDINE HCL 4 MG PO TABS: 4.0000 mg | ORAL_TABLET | Freq: Four times a day (QID) | ORAL | Status: DC | PRN

## 2015-07-21 NOTE — Progress Notes (Signed)
Patient's Name: Gabriella Robinson MRN: [id number] DOB: 10-Jun-1961 DOS: 07/21/2015  Primary Reason(s) for Visit: Encounter for Medication Management CC: Back Pain   HPI:    Ms. Ivory is a 54 y.o. year old, female patient, who returns today as an established patient. She has Chronic pain; Long term current use of opiate analgesic; Long term prescription opiate use; Opiate use; Opioid dependence, daily use ([place]); Uncomplicated opioid dependence ([place]); Enthesopathy of hip; Migraine without aura and responsive to treatment; Carpal tunnel syndrome; Multiple sclerosis ([place]); Neuropathy ([place]); Hand paresthesia; Adiposity; Disordered sleep; Chronic neck pain (Location of Primary Source of Pain) (Right); Chronic cervical radicular pain (Right) (C7/C8 Dermatome); Chronic low back pain (Left); Neuropathic pain; Musculoskeletal pain; Myofascial pain; Diffuse myofascial pain syndrome; and Restless leg syndrome on her problem list.. Her primarily concern today is the Back Pain     The patient returns to the clinic today for pharmacological management of her chronic pain. Unfortunately, she indicates that she has been taking more medication than prescribed. In fact, she admits to doubling up on what she was prescribed. Today I had a very long conversation with this patient with rest of this and the risk that she has taken by doing this without consulting me. I have informed the patient that this is a top noncompliance with the medical plan and I was very clear in explained to her that if she does it again, I will need to discontinue the use of these controlled substances.  Today's Pain Score: 6 , clinically she looks like a 2-3/10. Reported level of pain is incompatible with clinical obrservations. This may be secondary to a possible lack of understanding on how the pain scale works.    Date of Last Visit:  (patient seen at [facility] in the summer for procedure)    Pharmacotherapy Review:   Side-effects or Adverse  reactions: None reported Effectiveness: Described as ineffective and would like to make some changes Onset of action: Within expected pharmacological parameters Duration of action: Within normal limits for medication Peak effect: Timing and results are as within normal expected parameters [place] PMP: Compliant with practice rules and regulations UDS Results: None available at this time. UDS Interpretation: No UDS available, at this time Medication Assessment Form: Reviewed. Abnormalities discussed. Long conversation with her August to noncompliance with medication orders. Treatment compliance: Non-compliant. Steps taken to remind the patient of the seriousness of adequate therapy compliance Substance Use Disorder (SUD) Risk Level: High Pharmacologic Plan: Today I took time to go over her medications and we have decided to discontinue the oxycodone 5 and instead we used a 10 mg pills. I will given enough one month and then determine if she has been using them appropriately.  Lab Work: Inflammation Markers No results found for: ESRSEDRATE, CRP  Renal Function Lab Results  Component Value Date   BUN 11 03/14/2015   CREATININE 0.66 03/14/2015   GFRAA >60 03/14/2015   GFRNONAA >60 03/14/2015    Hepatic Function Lab Results  Component Value Date   AST 47* 03/14/2015   ALT 51 03/14/2015   ALBUMIN 3.9 03/14/2015    Electrolytes Lab Results  Component Value Date   NA 132* 03/14/2015   K 4.1 03/14/2015   CL 98* 03/14/2015   CALCIUM 9.8 [id number]    Illicit Drugs No results found for: THCU, COCAINSCRNUR, PCPSCRNUR, MDMA, AMPHETMU, METHADONE, ETOH   Allergies:  Ms. Loose has No Known Allergies.  Meds:  The patient has a current medication list which includes  the following prescription(s): acetaminophen, esomeprazole, gabapentin, interferon beta-1a, lisinopril, meloxicam, oxybutynin, pioglitazone-metformin, ropinirole, sitagliptin-metformin, topiramate, vitamin d (ergocalciferol),  vitamin d3, glipizide, onetouch verio, oxycodone hcl, rebif, tizanidine, and trazodone. Requested Prescriptions   Signed Prescriptions Disp Refills  . gabapentin (NEURONTIN) 800 MG tablet 120 tablet 0    Sig: Take 1 tablet (800 mg total) by mouth 4 (four) times daily.  Marland Kitchen tiZANidine (ZANAFLEX) 4 MG tablet 120 tablet 2    Sig: Take 1 tablet (4 mg total) by mouth every 6 (six) hours as needed for muscle spasms.  . Oxycodone HCl 10 MG TABS 120 tablet 0    Sig: Take 1 tablet (10 mg total) by mouth every 6 (six) hours as needed.    ROS:  Constitutional: Afebrile, no chills, well hydrated and well nourished Gastrointestinal: negative Musculoskeletal:negative Neurological: negative Behavioral/Psych: negative  PFSH:  Medical:  Ms. Hiraldo  has a past medical history of Diabetes ([place]); MS (multiple sclerosis) ([place]); HBP (high blood pressure); Sinus complaint; History of blood clots; Migraines; Sleep apnea; and Lumbar canal stenosis (11/12/2013). Family: family history is not on file. Surgical:  has past surgical history that includes Eye surgery (Bilateral); Ectopic pregnancy surgery; Abdominal hysterectomy; Back surgery; and Foot surgery (Bilateral). Tobacco:  reports that she has never smoked. She has never used smokeless tobacco. Alcohol:  reports that she does not drink alcohol. Drug:  has no drug history on file.  Physical Exam:  Vitals:  Today's Vitals   07/21/15 1453  BP: 130/79  Pulse: 117  Temp: 98.3 F (36.8 C)  TempSrc: Oral  Resp: 18  Height: 5' 8.5" (1.74 m)  Weight: 258 lb (117.028 kg)  SpO2: 100%  PainSc: 6   Calculated BMI: Body mass index is 38.65 kg/(m^2). General appearance: alert, cooperative, appears stated age, no distress and morbidly obese Eyes: PERLA Respiratory: No evidence respiratory distress, no audible rales or ronchi and no use of accessory muscles of respiration Neck: no adenopathy, no carotid bruit, no JVD, supple, symmetrical, trachea midline and  thyroid not enlarged, symmetric, no tenderness/mass/nodules  Cervical Spine ROM: Adequate for flexion, extension, rotation, and lateral bending. Some discomfort at hand of range of motion. Palpation: No palpable trigger points  Upper Extremities ROM: Adequate bilaterally Strength: 5/5 for all flexors and extensors of the upper extremity, bilaterally Pulses: Palpable bilaterally Neurologic: No allodynia, No hyperesthesia, No hyperpathia and No sensory abnormalities. Negative Tinel's or Phalen's sign.  Lumbar Spine ROM: Adequate for flexion, extension, rotation, and lateral bending Palpation: No palpable trigger points Lumbar Hyperextension and rotation: Non-contributory Patrick's Maneuver: Non-contributory  Lower Extremities ROM: Adequate bilaterally Strength: 5/5 for all flexors and extensors of the lower extremity, bilaterally Pulses: Palpable bilaterally Neurologic: No allodynia, No hyperesthesia, No hyperpathia, No sensory abnormalities and No antalgic gait or posture  Assessment:  Encounter Diagnosis:  Primary Diagnosis: Chronic pain [G89.29]  Plan:  Interventional Therapies: None at this time.  [place]on[place] was seen today for back pain.  Diagnoses and all orders for this visit:  Chronic pain -     COMPLETE METABOLIC PANEL WITH GFR; Future -     C-reactive protein; Future -     Magnesium; Future -     Sedimentation rate; Future -     Vitamin D2,D3 Panel; Future -     Oxycodone HCl 10 MG TABS; Take 1 tablet (10 mg total) by mouth every 6 (six) hours as needed.  Long term current use of opiate analgesic -     Drugs of abuse screen w/o  alc, rtn urine-sln  Long term prescription opiate use  Opiate use  Opioid dependence, daily use (HCC)  Uncomplicated opioid dependence (HCC)  Multiple sclerosis (HCC)  Lumbar canal stenosis  Chronic neck pain (Location of Primary Source of Pain) (Right)  Chronic cervical radicular pain (Right) (C7/C8 Dermatome)  Chronic low back  pain (Left)  Neuropathic pain  Musculoskeletal pain  Myofascial pain  Diffuse myofascial pain syndrome  Neuropathy (HCC)  Restless leg syndrome  Other orders -     Cancel: cyclobenzaprine (FLEXERIL) 10 MG tablet; Take 1 tablet (10 mg total) by mouth 3 (three) times daily as needed for muscle spasms. -     gabapentin (NEURONTIN) 800 MG tablet; Take 1 tablet (800 mg total) by mouth 4 (four) times daily. -     Cancel: HYDROcodone-acetaminophen (NORCO/VICODIN) 5-325 MG tablet; Take 1 tablet by mouth every 4 (four) hours as needed. -     Cancel: meloxicam (MOBIC) 15 MG tablet; Take 1 tablet (15 mg total) by mouth daily. -     tiZANidine (ZANAFLEX) 4 MG tablet; Take 1 tablet (4 mg total) by mouth every 6 (six) hours as needed for muscle spasms.     There are no Patient Instructions on file for this visit. Medications discontinued today:  Medications Discontinued During This Encounter  Medication Reason  . HYDROcodone-acetaminophen (NORCO/VICODIN) 5-325 MG per tablet Error  . meloxicam (MOBIC) 15 MG tablet Error  . oxyCODONE (OXYCONTIN) 10 MG 12 hr tablet Error  . topiramate (TOPAMAX) 50 MG tablet Error  . cyclobenzaprine (FLEXERIL) 10 MG tablet Change in therapy  . oxyCODONE (OXY IR/ROXICODONE) 5 MG immediate release tablet   . gabapentin (NEURONTIN) 800 MG tablet Reorder  . tiZANidine (ZANAFLEX) 4 MG tablet Reorder   Medications administered today:  Ms. Wolstenholme had no medications administered during this visit.  Primary Care Physician: Casilda Carls, MD Location: [facility] Note by: Kathlen Brunswick. Dossie Arbour, M.D, DABA, DABAPM, DABPM, DABIPP, FIPP

## 2015-07-21 NOTE — Progress Notes (Signed)
Safety precautions to be maintained throughout the outpatient stay will include: orient to surroundings, keep bed in low position, maintain call bell within reach at all times, provide assistance with transfer out of bed and ambulation.  

## 2015-07-28 LAB — SPECIMEN STATUS REPORT

## 2015-07-28 LAB — TOXASSURE SELECT 13 (MW), URINE: PDF: 0

## 2015-08-04 NOTE — Progress Notes (Signed)
Quick Note:  The results of this test were unexpected. No therapeutic levels of the prescribed medication were found. Results will need to be reviewed with patient. Possible causes include: PRN use; increased intake with early depletion of medication; or opioid deveation. We will be paying attention to pill counts to assist with diferential. ______ 

## 2015-08-15 ENCOUNTER — Ambulatory Visit: Payer: Medicare Other | Admitting: Podiatry

## 2015-08-17 ENCOUNTER — Encounter: Payer: Self-pay | Admitting: Pain Medicine

## 2015-08-17 ENCOUNTER — Other Ambulatory Visit: Payer: Self-pay | Admitting: Pain Medicine

## 2015-08-17 ENCOUNTER — Ambulatory Visit: Payer: Medicare Other | Attending: Pain Medicine | Admitting: Pain Medicine

## 2015-08-17 VITALS — BP 149/91 | HR 111 | Temp 98.4°F | Resp 16 | Ht 68.0 in | Wt 258.0 lb

## 2015-08-17 DIAGNOSIS — F1190 Opioid use, unspecified, uncomplicated: Secondary | ICD-10-CM

## 2015-08-17 DIAGNOSIS — M545 Low back pain: Secondary | ICD-10-CM | POA: Diagnosis not present

## 2015-08-17 DIAGNOSIS — M792 Neuralgia and neuritis, unspecified: Secondary | ICD-10-CM

## 2015-08-17 DIAGNOSIS — R252 Cramp and spasm: Secondary | ICD-10-CM

## 2015-08-17 DIAGNOSIS — G8929 Other chronic pain: Secondary | ICD-10-CM

## 2015-08-17 DIAGNOSIS — Z79891 Long term (current) use of opiate analgesic: Secondary | ICD-10-CM | POA: Diagnosis not present

## 2015-08-17 DIAGNOSIS — M797 Fibromyalgia: Secondary | ICD-10-CM

## 2015-08-17 DIAGNOSIS — G35 Multiple sclerosis: Secondary | ICD-10-CM

## 2015-08-17 DIAGNOSIS — G2581 Restless legs syndrome: Secondary | ICD-10-CM

## 2015-08-17 MED ORDER — OXYCODONE HCL 10 MG PO TABS: 10.0000 mg | ORAL_TABLET | Freq: Four times a day (QID) | ORAL | Status: DC | PRN

## 2015-08-17 MED ORDER — GABAPENTIN 800 MG PO TABS: 800.0000 mg | ORAL_TABLET | Freq: Four times a day (QID) | ORAL | Status: DC

## 2015-08-17 NOTE — Patient Instructions (Signed)
GENERAL RISKS AND COMPLICATIONS  What are the risk, side effects and possible complications? Generally speaking, most procedures are safe.  However, with any procedure there are risks, side effects, and the possibility of complications.  The risks and complications are dependent upon the sites that are lesioned, or the type of nerve block to be performed.  The closer the procedure is to the spine, the more serious the risks are.  Great care is taken when placing the radio frequency needles, block needles or lesioning probes, but sometimes complications can occur. 1. Infection: Any time there is an injection through the skin, there is a risk of infection.  This is why sterile conditions are used for these blocks.  There are four possible types of infection. 1. Localized skin infection. 2. Central Nervous System Infection-This can be in the form of Meningitis, which can be deadly. 3. Epidural Infections-This can be in the form of an epidural abscess, which can cause pressure inside of the spine, causing compression of the spinal cord with subsequent paralysis. This would require an emergency surgery to decompress, and there are no guarantees that the patient would recover from the paralysis. 4. Discitis-This is an infection of the intervertebral discs.  It occurs in about 1% of discography procedures.  It is difficult to treat and it may lead to surgery.        2. Pain: the needles have to go through skin and soft tissues, will cause soreness.       3. Damage to internal structures:  The nerves to be lesioned may be near blood vessels or    other nerves which can be potentially damaged.       4. Bleeding: Bleeding is more common if the patient is taking blood thinners such as  aspirin, Coumadin, Ticiid, Plavix, etc., or if he/she have some genetic predisposition  such as hemophilia. Bleeding into the spinal canal can cause compression of the spinal  cord with subsequent paralysis.  This would require an  emergency surgery to  decompress and there are no guarantees that the patient would recover from the  paralysis.       5. Pneumothorax:  Puncturing of a lung is a possibility, every time a needle is introduced in  the area of the chest or upper back.  Pneumothorax refers to free air around the  collapsed lung(s), inside of the thoracic cavity (chest cavity).  Another two possible  complications related to a similar event would include: Hemothorax and Chylothorax.   These are variations of the Pneumothorax, where instead of air around the collapsed  lung(s), you may have blood or chyle, respectively.       6. Spinal headaches: They may occur with any procedures in the area of the spine.       7. Persistent CSF (Cerebro-Spinal Fluid) leakage: This is a rare problem, but may occur  with prolonged intrathecal or epidural catheters either due to the formation of a fistulous  track or a dural tear.       8. Nerve damage: By working so close to the spinal cord, there is always a possibility of  nerve damage, which could be as serious as a permanent spinal cord injury with  paralysis.       9. Death:  Although rare, severe deadly allergic reactions known as "Anaphylactic  reaction" can occur to any of the medications used.      10. Worsening of the symptoms:  We can always make thing worse.    What are the chances of something like this happening? Chances of any of this occuring are extremely low.  By statistics, you have more of a chance of getting killed in a motor vehicle accident: while driving to the hospital than any of the above occurring .  Nevertheless, you should be aware that they are possibilities.  In general, it is similar to taking a shower.  Everybody knows that you can slip, hit your head and get killed.  Does that mean that you should not shower again?  Nevertheless always keep in mind that statistics do not mean anything if you happen to be on the wrong side of them.  Even if a procedure has a 1  (one) in a 1,000,000 (million) chance of going wrong, it you happen to be that one..Also, keep in mind that by statistics, you have more of a chance of having something go wrong when taking medications.  Who should not have this procedure? If you are on a blood thinning medication (e.g. Coumadin, Plavix, see list of "Blood Thinners"), or if you have an active infection going on, you should not have the procedure.  If you are taking any blood thinners, please inform your physician.  How should I prepare for this procedure?  Do not eat or drink anything at least six hours prior to the procedure.  Bring a driver with you .  It cannot be a taxi.  Come accompanied by an adult that can drive you back, and that is strong enough to help you if your legs get weak or numb from the local anesthetic.  Take all of your medicines the morning of the procedure with just enough water to swallow them.  If you have diabetes, make sure that you are scheduled to have your procedure done first thing in the morning, whenever possible.  If you have diabetes, take only half of your insulin dose and notify our nurse that you have done so as soon as you arrive at the clinic.  If you are diabetic, but only take blood sugar pills (oral hypoglycemic), then do not take them on the morning of your procedure.  You may take them after you have had the procedure.  Do not take aspirin or any aspirin-containing medications, at least eleven (11) days prior to the procedure.  They may prolong bleeding.  Wear loose fitting clothing that may be easy to take off and that you would not mind if it got stained with Betadine or blood.  Do not wear any jewelry or perfume  Remove any nail coloring.  It will interfere with some of our monitoring equipment.  NOTE: Remember that this is not meant to be interpreted as a complete list of all possible complications.  Unforeseen problems may occur.  BLOOD THINNERS The following drugs  contain aspirin or other products, which can cause increased bleeding during surgery and should not be taken for 2 weeks prior to and 1 week after surgery.  If you should need take something for relief of minor pain, you may take acetaminophen which is found in Tylenol,m Datril, Anacin-3 and Panadol. It is not blood thinner. The products listed below are.  Do not take any of the products listed below in addition to any listed on your instruction sheet.  A.P.C or A.P.C with Codeine Codeine Phosphate Capsules #3 Ibuprofen Ridaura  ABC compound Congesprin Imuran rimadil  Advil Cope Indocin Robaxisal  Alka-Seltzer Effervescent Pain Reliever and Antacid Coricidin or Coricidin-D  Indomethacin Rufen    Alka-Seltzer plus Cold Medicine Cosprin Ketoprofen S-A-C Tablets  Anacin Analgesic Tablets or Capsules Coumadin Korlgesic Salflex  Anacin Extra Strength Analgesic tablets or capsules CP-2 Tablets Lanoril Salicylate  Anaprox Cuprimine Capsules Levenox Salocol  Anexsia-D Dalteparin Magan Salsalate  Anodynos Darvon compound Magnesium Salicylate Sine-off  Ansaid Dasin Capsules Magsal Sodium Salicylate  Anturane Depen Capsules Marnal Soma  APF Arthritis pain formula Dewitt's Pills Measurin Stanback  Argesic Dia-Gesic Meclofenamic Sulfinpyrazone  Arthritis Bayer Timed Release Aspirin Diclofenac Meclomen Sulindac  Arthritis pain formula Anacin Dicumarol Medipren Supac  Analgesic (Safety coated) Arthralgen Diffunasal Mefanamic Suprofen  Arthritis Strength Bufferin Dihydrocodeine Mepro Compound Suprol  Arthropan liquid Dopirydamole Methcarbomol with Aspirin Synalgos  ASA tablets/Enseals Disalcid Micrainin Tagament  Ascriptin Doan's Midol Talwin  Ascriptin A/D Dolene Mobidin Tanderil  Ascriptin Extra Strength Dolobid Moblgesic Ticlid  Ascriptin with Codeine Doloprin or Doloprin with Codeine Momentum Tolectin  Asperbuf Duoprin Mono-gesic Trendar  Aspergum Duradyne Motrin or Motrin IB Triminicin  Aspirin  plain, buffered or enteric coated Durasal Myochrisine Trigesic  Aspirin Suppositories Easprin Nalfon Trillsate  Aspirin with Codeine Ecotrin Regular or Extra Strength Naprosyn Uracel  Atromid-S Efficin Naproxen Ursinus  Auranofin Capsules Elmiron Neocylate Vanquish  Axotal Emagrin Norgesic Verin  Azathioprine Empirin or Empirin with Codeine Normiflo Vitamin E  Azolid Emprazil Nuprin Voltaren  Bayer Aspirin plain, buffered or children's or timed BC Tablets or powders Encaprin Orgaran Warfarin Sodium  Buff-a-Comp Enoxaparin Orudis Zorpin  Buff-a-Comp with Codeine Equegesic Os-Cal-Gesic   Buffaprin Excedrin plain, buffered or Extra Strength Oxalid   Bufferin Arthritis Strength Feldene Oxphenbutazone   Bufferin plain or Extra Strength Feldene Capsules Oxycodone with Aspirin   Bufferin with Codeine Fenoprofen Fenoprofen Pabalate or Pabalate-SF   Buffets II Flogesic Panagesic   Buffinol plain or Extra Strength Florinal or Florinal with Codeine Panwarfarin   Buf-Tabs Flurbiprofen Penicillamine   Butalbital Compound Four-way cold tablets Penicillin   Butazolidin Fragmin Pepto-Bismol   Carbenicillin Geminisyn Percodan   Carna Arthritis Reliever Geopen Persantine   Carprofen Gold's salt Persistin   Chloramphenicol Goody's Phenylbutazone   Chloromycetin Haltrain Piroxlcam   Clmetidine heparin Plaquenil   Cllnoril Hyco-pap Ponstel   Clofibrate Hydroxy chloroquine Propoxyphen         Before stopping any of these medications, be sure to consult the physician who ordered them.  Some, such as Coumadin (Warfarin) are ordered to prevent or treat serious conditions such as "deep thrombosis", "pumonary embolisms", and other heart problems.  The amount of time that you may need off of the medication may also vary with the medication and the reason for which you were taking it.  If you are taking any of these medications, please make sure you notify your pain physician before you undergo any  procedures.         Facet Blocks Patient Information  Description: The facets are joints in the spine between the vertebrae.  Like any joints in the body, facets can become irritated and painful.  Arthritis can also effect the facets.  By injecting steroids and local anesthetic in and around these joints, we can temporarily block the nerve supply to them.  Steroids act directly on irritated nerves and tissues to reduce selling and inflammation which often leads to decreased pain.  Facet blocks may be done anywhere along the spine from the neck to the low back depending upon the location of your pain.   After numbing the skin with local anesthetic (like Novocaine), a small needle is passed onto the facet joints under x-ray guidance.    You may experience a sensation of pressure while this is being done.  The entire block usually lasts about 15-25 minutes.   Conditions which may be treated by facet blocks:   Low back/buttock pain  Neck/shoulder pain  Certain types of headaches  Preparation for the injection:  1. Do not eat any solid food or dairy products within 6 hours of your appointment. 2. You may drink clear liquid up to 2 hours before appointment.  Clear liquids include water, black coffee, juice or soda.  No milk or cream please. 3. You may take your regular medication, including pain medications, with a sip of water before your appointment.  Diabetics should hold regular insulin (if taken separately) and take 1/2 normal NPH dose the morning of the procedure.  Carry some sugar containing items with you to your appointment. 4. A driver must accompany you and be prepared to drive you home after your procedure. 5. Bring all your current medications with you. 6. An IV may be inserted and sedation may be given at the discretion of the physician. 7. A blood pressure cuff, EKG and other monitors will often be applied during the procedure.  Some patients may need to have extra oxygen  administered for a short period. 8. You will be asked to provide medical information, including your allergies and medications, prior to the procedure.  We must know immediately if you are taking blood thinners (like Coumadin/Warfarin) or if you are allergic to IV iodine contrast (dye).  We must know if you could possible be pregnant.  Possible side-effects:   Bleeding from needle site  Infection (rare, may require surgery)  Nerve injury (rare)  Numbness & tingling (temporary)  Difficulty urinating (rare, temporary)  Spinal headache (a headache worse with upright posture)  Light-headedness (temporary)  Pain at injection site (serveral days)  Decreased blood pressure (rare, temporary)  Weakness in arm/leg (temporary)  Pressure sensation in back/neck (temporary)   Call if you experience:   Fever/chills associated with headache or increased back/neck pain  Headache worsened by an upright position  New onset, weakness or numbness of an extremity below the injection site  Hives or difficulty breathing (go to the emergency room)  Inflammation or drainage at the injection site(s)  Severe back/neck pain greater than usual  New symptoms which are concerning to you  Please note:  Although the local anesthetic injected can often make your back or neck feel good for several hours after the injection, the pain will likely return. It takes 3-7 days for steroids to work.  You may not notice any pain relief for at least one week.  If effective, we will often do a series of 2-3 injections spaced 3-6 weeks apart to maximally decrease your pain.  After the initial series, you may be a candidate for a more permanent nerve block of the facets.  If you have any questions, please call [phone] [facility] 

## 2015-08-17 NOTE — Progress Notes (Signed)
Safety precautions to be maintained throughout the outpatient stay will include: orient to surroundings, keep bed in low position, maintain call bell within reach at all times, provide assistance with transfer out of bed and ambulation. Oxycodone pill count # 18

## 2015-08-17 NOTE — Progress Notes (Signed)
Patient's Name: Gabriella Robinson MRN: [id number] DOB: 12/19/1960 DOS: 08/17/2015  Primary Reason(s) for Visit: Encounter for Medication Management CC: Back Pain   HPI:    Gabriella Robinson is a 55 y.o. year old, female patient, who returns today as an established patient. She has Chronic pain; Long term current use of opiate analgesic; Long term prescription opiate use; Opiate use; Opioid dependence, daily use (Gabriella Robinson); Uncomplicated opioid dependence (Gabriella Robinson); Enthesopathy of hip (Left); Migraine without aura and responsive to treatment; Carpal tunnel syndrome; Multiple sclerosis ([place]); Neuropathy ([place]); Hand paresthesia; Adiposity; Disordered sleep; Chronic neck pain (Location of Primary Source of Pain) (Right); Chronic cervical radicular pain (Right) (C7/C8 Dermatome); Chronic low back pain (Left); Neuropathic pain; Musculoskeletal pain; Myofascial pain; Diffuse myofascial pain syndrome; Restless leg syndrome; Neurogenic pain; Fibromyalgia; and Nocturnal muscle cramps (Bilateral lower extremities) on her problem list.. Her primarily concern today is the Back Pain     The patient returns to the clinics today for pharmacological management of her chronic pain. She has been having more problems than usual because her insurance company has delayed her approval for the Neurontin 800 mg 4 times a day. This has been proven to be very effective with her neurogenic pain as well as her fibromyalgia. I really do not understand why they are denying it. It is my professional opinion that it is medically necessary for this patient to continue in this medication indefinitely. In addition, the patient continues to take oxycodone and she referred no side effects to it.  The patient's primary pain is in the lower left side of her back with pain going down to the buttocks area. She still having leg cramps at night have recommended that she take some type of sugar-free sports drinks to replace her electrolytes before we start  prescribing any type of medication for this. The most common causes for cramps are electrolyte and vitamin deficiencies. Both of these are easily correctable.  Unfortunately, she continues to be morbidly obese and this is definitely not helping with her low back pain.  Today's Pain Score: 4 , clinically she looks like a 2-3/10. Reported level of pain is incompatible with clinical obrservations. This may be secondary to a possible lack of understanding on how the pain scale works. Pain Type: Chronic pain Pain Location: Buttocks Pain Orientation: Left Pain Descriptors / Indicators: Gabriella Robinson Pain Frequency: Constant  Date of Last Visit: 07/21/15 Service Provided on Last Visit: Med Refill  Pharmacotherapy Review:   Side-effects or Adverse reactions: None reported Effectiveness: Described as relatively effective, allowing for increase in activities of daily living (ADL) Onset of action: Within expected pharmacological parameters Duration of action: Within normal limits for medication Peak effect: Timing and results are as within normal expected parameters [place] PMP: Compliant with practice rules and regulations UDS Results: The patient's last UDS was done on 07/21/2015 and it came back with unexpected results since there was no oxycodone in her system. Having said that, the patient also did not have any other medications in the system suggesting that she either ran out of medication or was taking it on a when necessary basis. UDS Interpretation: Patient appears to be compliant with practice rules and regulations Medication Assessment Form: Reviewed. Patient indicates being compliant with therapy Treatment compliance: Compliant Substance Use Disorder (SUD) Risk Level: Moderate Pharmacologic Plan: Continue therapy as is  Lab Work: Illicit Drugs No results found for: THCU, COCAINSCRNUR, PCPSCRNUR, MDMA, AMPHETMU, METHADONE, ETOH  Inflammation Markers No results found for: ESRSEDRATE,  CRP  Renal Function Lab Results  Component Value Date   BUN 11 03/14/2015   CREATININE 0.66 03/14/2015   GFRAA >60 03/14/2015   GFRNONAA >60 03/14/2015    Hepatic Function Lab Results  Component Value Date   AST 47* 03/14/2015   ALT 51 03/14/2015   ALBUMIN 3.9 03/14/2015    Electrolytes Lab Results  Component Value Date   NA 132* 03/14/2015   K 4.1 03/14/2015   CL 98* 03/14/2015   CALCIUM 9.8 03/14/2015   MG 1.6* 01/13/2013    Allergies:  Gabriella Robinson has No Known Allergies.  Meds:  The patient has a current medication list which includes the following prescription(s): acetaminophen, vitamin d3, esomeprazole, glipizide, interferon beta-1a, lisinopril, meloxicam, onetouch verio, oxybutynin, oxycodone hcl, sitagliptin-metformin, topiramate, trazodone, and gabapentin. Requested Prescriptions   Signed Prescriptions Disp Refills  . gabapentin (NEURONTIN) 800 MG tablet 120 tablet 5    Sig: Take 1 tablet (800 mg total) by mouth 4 (four) times daily.  . Oxycodone HCl 10 MG TABS 120 tablet 0    Sig: Take 1 tablet (10 mg total) by mouth every 6 (six) hours as needed.    ROS:  Constitutional: Afebrile, no chills, well hydrated and well nourished Gastrointestinal: negative Musculoskeletal:negative Neurological: negative Behavioral/Psych: negative  PFSH:  Medical:  Gabriella Robinson  has a past medical history of Diabetes ([place]); MS (multiple sclerosis) ([place]); HBP (high blood pressure); Sinus complaint; History of blood clots; Migraines; Sleep apnea; and Lumbar canal stenosis (11/12/2013). Family: family history is not on file. Surgical:  has past surgical history that includes Eye surgery (Bilateral); Ectopic pregnancy surgery; Abdominal hysterectomy; Back surgery; and Foot surgery (Bilateral). Tobacco:  reports that she has never smoked. She has never used smokeless tobacco. Alcohol:  reports that she does not drink alcohol. Drug:  has no drug history on file.  Physical Exam:   Vitals:  Today's Vitals   08/17/15 1142 08/17/15 1143  BP:  149/91  Pulse: 111   Temp: 98.4 F (36.9 C)   Resp: 16   Height: _0  (1.727 m)   Weight: 258 lb (117.028 kg)   SpO2: 98%   PainSc: 4  4   Calculated BMI: Body mass index is 39.24 kg/(m^2). General appearance: alert, cooperative, appears stated age, mild distress and morbidly obese Eyes: PERLA Respiratory: No evidence respiratory distress, no audible rales or ronchi and no use of accessory muscles of respiration Neck: no adenopathy, no carotid bruit, no JVD, supple, symmetrical, trachea midline and thyroid not enlarged, symmetric, no tenderness/mass/nodules  Cervical Spine ROM: Adequate for flexion, extension, rotation, and lateral bending Palpation: No palpable trigger points  Upper Extremities ROM: Adequate bilaterally Strength: 5/5 for all flexors and extensors of the upper extremity, bilaterally Pulses: Palpable bilaterally Neurologic: No allodynia, No hyperesthesia, No hyperpathia and No sensory abnormalities  Lumbar Spine ROM: Adequate for flexion, extension, rotation, and lateral bending Palpation: No palpable trigger points Lumbar Hyperextension and rotation: Non-contributory Patrick's Maneuver: Non-contributory  Lower Extremities ROM: Adequate bilaterally Strength: 5/5 for all flexors and extensors of the lower extremity, bilaterally Pulses: Palpable bilaterally Neurologic: No allodynia, No hyperesthesia, No hyperpathia, No sensory abnormalities and No antalgic gait or posture  Assessment:  Encounter Diagnosis:  Primary Diagnosis: Chronic pain [G89.29]  Plan:   Interventional Therapies: PRN procedure: Left lumbar facet block under fluoroscopic guidance and IV sedation.    Kensleigh was seen today for back pain.  Diagnoses and all orders for this visit:  Chronic pain -     Oxycodone  HCl 10 MG TABS; Take 1 tablet (10 mg total) by mouth every 6 (six) hours as needed.  Chronic low back pain  (Left)  Opiate use  Long term current use of opiate analgesic -     Drugs of abuse screen w/o alc, rtn urine-sln  Neurogenic pain -     gabapentin (NEURONTIN) 800 MG tablet; Take 1 tablet (800 mg total) by mouth 4 (four) times daily.  Fibromyalgia -     gabapentin (NEURONTIN) 800 MG tablet; Take 1 tablet (800 mg total) by mouth 4 (four) times daily.  Restless leg syndrome  Multiple sclerosis ([facility])  Nocturnal muscle cramps (Bilateral lower extremities)     Patient Instructions   GENERAL RISKS AND COMPLICATIONS  What are the risk, side effects and possible complications? Generally speaking, most procedures are safe.  However, with any procedure there are risks, side effects, and the possibility of complications.  The risks and complications are dependent upon the sites that are lesioned, or the type of nerve block to be performed.  The closer the procedure is to the spine, the more serious the risks are.  Great care is taken when placing the radio frequency needles, block needles or lesioning probes, but sometimes complications can occur. 1. Infection: Any time there is an injection through the skin, there is a risk of infection.  This is why sterile conditions are used for these blocks.  There are four possible types of infection. 1. Localized skin infection. 2. Central Nervous System Infection-This can be in the form of Meningitis, which can be deadly. 3. Epidural Infections-This can be in the form of an epidural abscess, which can cause pressure inside of the spine, causing compression of the spinal cord with subsequent paralysis. This would require an emergency surgery to decompress, and there are no guarantees that the patient would recover from the paralysis. 4. Discitis-This is an infection of the intervertebral discs.  It occurs in about 1% of discography procedures.  It is difficult to treat and it may lead to surgery.        2. Pain: the needles have to go through skin and  soft tissues, will cause soreness.       3. Damage to internal structures:  The nerves to be lesioned may be near blood vessels or    other nerves which can be potentially damaged.       4. Bleeding: Bleeding is more common if the patient is taking blood thinners such as  aspirin, Coumadin, Ticiid, Plavix, etc., or if he/she have some genetic predisposition  such as hemophilia. Bleeding into the spinal canal can cause compression of the spinal  cord with subsequent paralysis.  This would require an emergency surgery to  decompress and there are no guarantees that the patient would recover from the  paralysis.       5. Pneumothorax:  Puncturing of a lung is a possibility, every time a needle is introduced in  the area of the chest or upper back.  Pneumothorax refers to free air around the  collapsed lung(s), inside of the thoracic cavity (chest cavity).  Another two possible  complications related to a similar event would include: Hemothorax and Chylothorax.   These are variations of the Pneumothorax, where instead of air around the collapsed  lung(s), you may have blood or chyle, respectively.       6. Spinal headaches: They may occur with any procedures in the area of the spine.  7. Persistent CSF (Cerebro-Spinal Fluid) leakage: This is a rare problem, but may occur  with prolonged intrathecal or epidural catheters either due to the formation of a fistulous  track or a dural tear.       8. Nerve damage: By working so close to the spinal cord, there is always a possibility of  nerve damage, which could be as serious as a permanent spinal cord injury with  paralysis.       9. Death:  Although rare, severe deadly allergic reactions known as "Anaphylactic  reaction" can occur to any of the medications used.      10. Worsening of the symptoms:  We can always make thing worse.  What are the chances of something like this happening? Chances of any of this occuring are extremely low.  By statistics, you  have more of a chance of getting killed in a motor vehicle accident: while driving to the hospital than any of the above occurring .  Nevertheless, you should be aware that they are possibilities.  In general, it is similar to taking a shower.  Everybody knows that you can slip, hit your head and get killed.  Does that mean that you should not shower again?  Nevertheless always keep in mind that statistics do not mean anything if you happen to be on the wrong side of them.  Even if a procedure has a 1 (one) in a 1,000,000 (million) chance of going wrong, it you happen to be that one..Also, keep in mind that by statistics, you have more of a chance of having something go wrong when taking medications.  Who should not have this procedure? If you are on a blood thinning medication (e.g. Coumadin, Plavix, see list of "Blood Thinners"), or if you have an active infection going on, you should not have the procedure.  If you are taking any blood thinners, please inform your physician.  How should I prepare for this procedure?  Do not eat or drink anything at least six hours prior to the procedure.  Bring a driver with you .  It cannot be a taxi.  Come accompanied by an adult that can drive you back, and that is strong enough to help you if your legs get weak or numb from the local anesthetic.  Take all of your medicines the morning of the procedure with just enough water to swallow them.  If you have diabetes, make sure that you are scheduled to have your procedure done first thing in the morning, whenever possible.  If you have diabetes, take only half of your insulin dose and notify our nurse that you have done so as soon as you arrive at the clinic.  If you are diabetic, but only take blood sugar pills (oral hypoglycemic), then do not take them on the morning of your procedure.  You may take them after you have had the procedure.  Do not take aspirin or any aspirin-containing medications, at least  eleven (11) days prior to the procedure.  They may prolong bleeding.  Wear loose fitting clothing that may be easy to take off and that you would not mind if it got stained with Betadine or blood.  Do not wear any jewelry or perfume  Remove any nail coloring.  It will interfere with some of our monitoring equipment.  NOTE: Remember that this is not meant to be interpreted as a complete list of all possible complications.  Unforeseen problems may occur.  BLOOD THINNERS  The following drugs contain aspirin or other products, which can cause increased bleeding during surgery and should not be taken for 2 weeks prior to and 1 week after surgery.  If you should need take something for relief of minor pain, you may take acetaminophen which is found in Tylenol,m Datril, Anacin-3 and Panadol. It is not blood thinner. The products listed below are.  Do not take any of the products listed below in addition to any listed on your instruction sheet.  A.P.C or A.P.C with Codeine Codeine Phosphate Capsules #3 Ibuprofen Ridaura  ABC compound Congesprin Imuran rimadil  Advil Cope Indocin Robaxisal  Alka-Seltzer Effervescent Pain Reliever and Antacid Coricidin or Coricidin-D  Indomethacin Rufen  Alka-Seltzer plus Cold Medicine Cosprin Ketoprofen S-A-C Tablets  Anacin Analgesic Tablets or Capsules Coumadin Korlgesic Salflex  Anacin Extra Strength Analgesic tablets or capsules CP-2 Tablets Lanoril Salicylate  Anaprox Cuprimine Capsules Levenox Salocol  Anexsia-D Dalteparin Magan Salsalate  Anodynos Darvon compound Magnesium Salicylate Sine-off  Ansaid Dasin Capsules Magsal Sodium Salicylate  Anturane Depen Capsules Marnal Soma  APF Arthritis pain formula Dewitt's Pills Measurin Stanback  Argesic Dia-Gesic Meclofenamic Sulfinpyrazone  Arthritis Bayer Timed Release Aspirin Diclofenac Meclomen Sulindac  Arthritis pain formula Anacin Dicumarol Medipren Supac  Analgesic (Safety coated) Arthralgen Diffunasal  Mefanamic Suprofen  Arthritis Strength Bufferin Dihydrocodeine Mepro Compound Suprol  Arthropan liquid Dopirydamole Methcarbomol with Aspirin Synalgos  ASA tablets/Enseals Disalcid Micrainin Tagament  Ascriptin Doan's Midol Talwin  Ascriptin A/D Dolene Mobidin Tanderil  Ascriptin Extra Strength Dolobid Moblgesic Ticlid  Ascriptin with Codeine Doloprin or Doloprin with Codeine Momentum Tolectin  Asperbuf Duoprin Mono-gesic Trendar  Aspergum Duradyne Motrin or Motrin IB Triminicin  Aspirin plain, buffered or enteric coated Durasal Myochrisine Trigesic  Aspirin Suppositories Easprin Nalfon Trillsate  Aspirin with Codeine Ecotrin Regular or Extra Strength Naprosyn Uracel  Atromid-S Efficin Naproxen Ursinus  Auranofin Capsules Elmiron Neocylate Vanquish  Axotal Emagrin Norgesic Verin  Azathioprine Empirin or Empirin with Codeine Normiflo Vitamin E  Azolid Emprazil Nuprin Voltaren  Bayer Aspirin plain, buffered or children's or timed BC Tablets or powders Encaprin Orgaran Warfarin Sodium  Buff-a-Comp Enoxaparin Orudis Zorpin  Buff-a-Comp with Codeine Equegesic Os-Cal-Gesic   Buffaprin Excedrin plain, buffered or Extra Strength Oxalid   Bufferin Arthritis Strength Feldene Oxphenbutazone   Bufferin plain or Extra Strength Feldene Capsules Oxycodone with Aspirin   Bufferin with Codeine Fenoprofen Fenoprofen Pabalate or Pabalate-SF   Buffets II Flogesic Panagesic   Buffinol plain or Extra Strength Florinal or Florinal with Codeine Panwarfarin   Buf-Tabs Flurbiprofen Penicillamine   Butalbital Compound Four-way cold tablets Penicillin   Butazolidin Fragmin Pepto-Bismol   Carbenicillin Geminisyn Percodan   Carna Arthritis Reliever Geopen Persantine   Carprofen Gold's salt Persistin   Chloramphenicol Goody's Phenylbutazone   Chloromycetin Haltrain Piroxlcam   Clmetidine heparin Plaquenil   Cllnoril Hyco-pap Ponstel   Clofibrate Hydroxy chloroquine Propoxyphen         Before stopping any of  these medications, be sure to consult the physician who ordered them.  Some, such as Coumadin (Warfarin) are ordered to prevent or treat serious conditions such as "deep thrombosis", "pumonary embolisms", and other heart problems.  The amount of time that you may need off of the medication may also vary with the medication and the reason for which you were taking it.  If you are taking any of these medications, please make sure you notify your pain physician before you undergo any procedures.         Facet Blocks Patient  Information  Description: The facets are joints in the spine between the vertebrae.  Like any joints in the body, facets can become irritated and painful.  Arthritis can also effect the facets.  By injecting steroids and local anesthetic in and around these joints, we can temporarily block the nerve supply to them.  Steroids act directly on irritated nerves and tissues to reduce selling and inflammation which often leads to decreased pain.  Facet blocks may be done anywhere along the spine from the neck to the low back depending upon the location of your pain.   After numbing the skin with local anesthetic (like Novocaine), a small needle is passed onto the facet joints under x-ray guidance.  You may experience a sensation of pressure while this is being done.  The entire block usually lasts about 15-25 minutes.   Conditions which may be treated by facet blocks:   Low back/buttock pain  Neck/shoulder pain  Certain types of headaches  Preparation for the injection:  1. Do not eat any solid food or dairy products within 6 hours of your appointment. 2. You may drink clear liquid up to 2 hours before appointment.  Clear liquids include water, black coffee, juice or soda.  No milk or cream please. 3. You may take your regular medication, including pain medications, with a sip of water before your appointment.  Diabetics should hold regular insulin (if taken separately) and  take 1/2 normal NPH dose the morning of the procedure.  Carry some sugar containing items with you to your appointment. 4. A driver must accompany you and be prepared to drive you home after your procedure. 5. Bring all your current medications with you. 6. An IV may be inserted and sedation may be given at the discretion of the physician. 7. A blood pressure cuff, EKG and other monitors will often be applied during the procedure.  Some patients may need to have extra oxygen administered for a short period. 8. You will be asked to provide medical information, including your allergies and medications, prior to the procedure.  We must know immediately if you are taking blood thinners (like Coumadin/Warfarin) or if you are allergic to IV iodine contrast (dye).  We must know if you could possible be pregnant.  Possible side-effects:   Bleeding from needle site  Infection (rare, may require surgery)  Nerve injury (rare)  Numbness & tingling (temporary)  Difficulty urinating (rare, temporary)  Spinal headache (a headache worse with upright posture)  Light-headedness (temporary)  Pain at injection site (serveral days)  Decreased blood pressure (rare, temporary)  Weakness in arm/leg (temporary)  Pressure sensation in back/neck (temporary)   Call if you experience:   Fever/chills associated with headache or increased back/neck pain  Headache worsened by an upright position  New onset, weakness or numbness of an extremity below the injection site  Hives or difficulty breathing (go to the emergency room)  Inflammation or drainage at the injection site(s)  Severe back/neck pain greater than usual  New symptoms which are concerning to you  Please note:  Although the local anesthetic injected can often make your back or neck feel good for several hours after the injection, the pain will likely return. It takes 3-7 days for steroids to work.  You may not notice any pain relief  for at least one week.  If effective, we will often do a series of 2-3 injections spaced 3-6 weeks apart to maximally decrease your pain.  After the initial series, you  may be a candidate for a more permanent nerve block of the facets.  If you have any questions, please call #[phone]) [facility]   Medications discontinued today:  Medications Discontinued During This Encounter  Medication Reason  . gabapentin (NEURONTIN) 800 MG tablet Reorder  . Oxycodone HCl 10 MG TABS Reorder  . diclofenac (VOLTAREN) 75 MG EC tablet Error  . pioglitazone-metformin (ACTOPLUS MET) 15-500 MG per tablet Error  . REBIF 22 MCG/0.5ML SOSY Error  . rOPINIRole (REQUIP) 1 MG tablet Error  . tiZANidine (ZANAFLEX) 4 MG tablet Error  . traZODone (DESYREL) 50 MG tablet Error  . Vitamin D, Ergocalciferol, (DRISDOL) 50000 UNITS CAPS Error   Medications administered today:  Ms. Cadet had no medications administered during this visit.  Primary Care Physician: Casilda Carls, MD Location: [facility] Note by: Kathlen Brunswick. Dossie Arbour, M.D, DABA, DABAPM, DABPM, DABIPP, FIPP

## 2015-08-19 ENCOUNTER — Telehealth: Payer: Self-pay

## 2015-08-19 NOTE — Telephone Encounter (Signed)
Left message that Neurontin has been approved.

## 2015-08-20 ENCOUNTER — Emergency Department
Admission: EM | Admit: 2015-08-20 | Discharge: 2015-08-20 | Disposition: A | Discharge status: Home / Self Care | Payer: Medicare Other | Attending: Emergency Medicine | Admitting: Emergency Medicine

## 2015-08-20 ENCOUNTER — Encounter: Payer: Self-pay | Admitting: Emergency Medicine

## 2015-08-20 DIAGNOSIS — IMO0001 Reserved for inherently not codable concepts without codable children: Secondary | ICD-10-CM

## 2015-08-20 DIAGNOSIS — Z79899 Other long term (current) drug therapy: Secondary | ICD-10-CM | POA: Insufficient documentation

## 2015-08-20 DIAGNOSIS — E119 Type 2 diabetes mellitus without complications: Secondary | ICD-10-CM | POA: Diagnosis not present

## 2015-08-20 DIAGNOSIS — R030 Elevated blood-pressure reading, without diagnosis of hypertension: Secondary | ICD-10-CM | POA: Insufficient documentation

## 2015-08-20 DIAGNOSIS — R609 Edema, unspecified: Secondary | ICD-10-CM

## 2015-08-20 DIAGNOSIS — M79643 Pain in unspecified hand: Secondary | ICD-10-CM | POA: Diagnosis present

## 2015-08-20 DIAGNOSIS — M7989 Other specified soft tissue disorders: Secondary | ICD-10-CM

## 2015-08-20 HISTORY — DX: Dorsalgia, unspecified: M54.9

## 2015-08-20 HISTORY — DX: Other chronic pain: G89.29

## 2015-08-20 LAB — CBC WITH DIFFERENTIAL/PLATELET
BASOS ABS: 0.1 10*3/uL (ref 0–0.1)
BASOS PCT: 1 %
EOS PCT: 1 %
Eosinophils Absolute: 0.1 10*3/uL (ref 0–0.7)
HCT: 39.5 % (ref 35.0–47.0)
Hemoglobin: 12.9 g/dL (ref 12.0–16.0)
Lymphocytes Relative: 32 %
Lymphs Abs: 3.2 10*3/uL (ref 1.0–3.6)
MCH: 28.6 pg (ref 26.0–34.0)
MCHC: 32.7 g/dL (ref 32.0–36.0)
MCV: 87.5 fL (ref 80.0–100.0)
MONO ABS: 0.6 10*3/uL (ref 0.2–0.9)
Monocytes Relative: 6 %
NEUTROS ABS: 6 10*3/uL (ref 1.4–6.5)
Neutrophils Relative %: 60 %
PLATELETS: 359 10*3/uL (ref 150–440)
RBC: 4.52 MIL/uL (ref 3.80–5.20)
RDW: 14.2 % (ref 11.5–14.5)
WBC: 9.9 10*3/uL (ref 3.6–11.0)

## 2015-08-20 LAB — BASIC METABOLIC PANEL
ANION GAP: 11 (ref 5–15)
BUN: 13 mg/dL (ref 6–20)
CALCIUM: 10.3 mg/dL (ref 8.9–10.3)
CO2: 24 mmol/L (ref 22–32)
Chloride: 105 mmol/L (ref 101–111)
Creatinine, Ser: 0.65 mg/dL (ref 0.44–1.00)
GFR calc Af Amer: >60 mL/min (ref >60)
GLUCOSE: 212 mg/dL — AB (ref 65–99)
Potassium: 3.9 mmol/L (ref 3.5–5.1)
Sodium: 140 mmol/L (ref 135–145)

## 2015-08-20 MED ORDER — HYDROCHLOROTHIAZIDE 12.5 MG PO CAPS: 12.5000 mg | ORAL_CAPSULE | ORAL | Status: DC

## 2015-08-20 NOTE — Discharge Instructions (Signed)
Edema Edema is an abnormal buildup of fluids. It is more common in your legs and thighs. Painless swelling of the feet and ankles is more likely as a person ages. It also is common in looser skin, like around your eyes. HOME CARE   Keep the affected body part above the level of the heart while lying down.  Do not sit still or stand for a long time.  Do not put anything right under your knees when you lie down.  Do not wear tight clothes on your upper legs.  Exercise your legs to help the puffiness (swelling) go down.  Wear elastic bandages or support stockings as told by your doctor.  A low-salt diet may help lessen the puffiness.  Only take medicine as told by your doctor. GET HELP IF:  Treatment is not working.  You have heart, liver, or kidney disease and notice that your skin looks puffy or shiny.  You have puffiness in your legs that does not get better when you raise your legs.  You have sudden weight gain for no reason. GET HELP RIGHT AWAY IF:   You have shortness of breath or chest pain.  You cannot breathe when you lie down.  You have pain, redness, or warmth in the areas that are puffy.  You have heart, liver, or kidney disease and get edema all of a sudden.  You have a fever and your symptoms get worse all of a sudden. MAKE SURE YOU:   Understand these instructions.  Will watch your condition.  Will get help right away if you are not doing well or get worse.   This information is not intended to replace advice given to you by your health care provider. Make sure you discuss any questions you have with your health care provider.   Document Released: 01/09/2008 Document Revised: 07/28/2013 Document Reviewed: 05/15/2013 Elsevier Interactive Patient Education 2016 [place] DASH stands for "Dietary Approaches to Stop Hypertension." The DASH eating plan is a healthy eating plan that has been shown to reduce high blood pressure  (hypertension). Additional health benefits may include reducing the risk of type 2 diabetes mellitus, heart disease, and stroke. The DASH eating plan may also help with weight loss. WHAT DO I NEED TO KNOW ABOUT THE DASH EATING PLAN? For the DASH eating plan, you will follow these general guidelines:  Choose foods with a percent daily value for sodium of less than 5% (as listed on the food label).  Use salt-free seasonings or herbs instead of table salt or sea salt.  Check with your health care provider or pharmacist before using salt substitutes.  Eat lower-sodium products, often labeled as "lower sodium" or "no salt added."  Eat fresh foods.  Eat more vegetables, fruits, and low-fat dairy products.  Choose whole grains. Look for the word "whole" as the first word in the ingredient list.  Choose fish and skinless chicken or [place] more often than red meat. Limit fish, poultry, and meat to 6 oz (170 g) each day.  Limit sweets, desserts, sugars, and sugary drinks.  Choose heart-healthy fats.  Limit cheese to 1 oz (28 g) per day.  Eat more home-cooked food and less restaurant, buffet, and fast food.  Limit fried foods.  Cook foods using methods other than frying.  Limit canned vegetables. If you do use them, rinse them well to decrease the sodium.  When eating at a restaurant, ask that your food be prepared with less salt,  or no salt if possible. WHAT FOODS CAN I EAT? Seek help from a dietitian for individual calorie needs. Grains Whole grain or whole wheat bread. Brown rice. Whole grain or whole wheat pasta. Quinoa, bulgur, and whole grain cereals. Low-sodium cereals. Corn or whole wheat flour tortillas. Whole grain cornbread. Whole grain crackers. Low-sodium crackers. Vegetables Fresh or frozen vegetables (raw, steamed, roasted, or grilled). Low-sodium or reduced-sodium tomato and vegetable juices. Low-sodium or reduced-sodium tomato sauce and paste. Low-sodium or  reduced-sodium canned vegetables.  Fruits All fresh, canned (in natural juice), or frozen fruits. Meat and Other Protein Products Ground beef (85% or leaner), grass-fed beef, or beef trimmed of fat. Skinless chicken or [place]. Ground chicken or [place]. Pork trimmed of fat. All fish and seafood. Eggs. Dried beans, peas, or lentils. Unsalted nuts and seeds. Unsalted canned beans. Dairy Low-fat dairy products, such as skim or 1% milk, 2% or reduced-fat cheeses, low-fat ricotta or cottage cheese, or plain low-fat yogurt. Low-sodium or reduced-sodium cheeses. Fats and Oils Tub margarines without trans fats. Light or reduced-fat mayonnaise and salad dressings (reduced sodium). Avocado. Safflower, olive, or canola oils. Natural peanut or almond butter. Other Unsalted popcorn and pretzels. The items listed above may not be a complete list of recommended foods or beverages. Contact your dietitian for more options. WHAT FOODS ARE NOT RECOMMENDED? Grains White bread. White pasta. White rice. Refined cornbread. Bagels and croissants. Crackers that contain trans fat. Vegetables Creamed or fried vegetables. Vegetables in a cheese sauce. Regular canned vegetables. Regular canned tomato sauce and paste. Regular tomato and vegetable juices. Fruits Dried fruits. Canned fruit in light or heavy syrup. Fruit juice. Meat and Other Protein Products Fatty cuts of meat. Ribs, chicken wings, bacon, sausage, bologna, salami, chitterlings, fatback, hot dogs, bratwurst, and packaged luncheon meats. Salted nuts and seeds. Canned beans with salt. Dairy Whole or 2% milk, cream, half-and-half, and cream cheese. Whole-fat or sweetened yogurt. Full-fat cheeses or blue cheese. Nondairy creamers and whipped toppings. Processed cheese, cheese spreads, or cheese curds. Condiments Onion and garlic salt, seasoned salt, table salt, and sea salt. Canned and packaged gravies. Worcestershire sauce. Tartar sauce. Barbecue sauce. Teriyaki  sauce. Soy sauce, including reduced sodium. Steak sauce. Fish sauce. Oyster sauce. Cocktail sauce. Horseradish. Ketchup and mustard. Meat flavorings and tenderizers. Bouillon cubes. Hot sauce. Tabasco sauce. Marinades. Taco seasonings. Relishes. Fats and Oils Butter, stick margarine, lard, shortening, ghee, and bacon fat. Coconut, palm kernel, or palm oils. Regular salad dressings. Other Pickles and olives. Salted popcorn and pretzels. The items listed above may not be a complete list of foods and beverages to avoid. Contact your dietitian for more information. WHERE CAN I FIND MORE INFORMATION? National Heart, Lung, and Blood Institute: travelstabloid.com   This information is not intended to replace advice given to you by your health care provider. Make sure you discuss any questions you have with your health care provider.   Document Released: 07/12/2011 Document Revised: 08/13/2014 Document Reviewed: 05/27/2013 Elsevier Interactive Patient Education 2016 [employer].  Hypertension Hypertension, commonly called high blood pressure, is when the force of blood pumping through your arteries is too strong. Your arteries are the blood vessels that carry blood from your heart throughout your body. A blood pressure reading consists of a higher number over a lower number, such as 110/72. The higher number (systolic) is the pressure inside your arteries when your heart pumps. The lower number (diastolic) is the pressure inside your arteries when your heart relaxes. Ideally you want your blood  pressure below 120/80. Hypertension forces your heart to work harder to pump blood. Your arteries may become narrow or stiff. Having untreated or uncontrolled hypertension can cause heart attack, stroke, kidney disease, and other problems. RISK FACTORS Some risk factors for high blood pressure are controllable. Others are not.  Risk factors you cannot control include:   Race.  You may be at higher risk if you are African American.  Age. Risk increases with age.  Gender. Men are at higher risk than women before age 75 years. After age 51, women are at higher risk than men. Risk factors you can control include:  Not getting enough exercise or physical activity.  Being overweight.  Getting too much fat, sugar, calories, or salt in your diet.  Drinking too much alcohol. SIGNS AND SYMPTOMS Hypertension does not usually cause signs or symptoms. Extremely high blood pressure (hypertensive crisis) may cause headache, anxiety, shortness of breath, and nosebleed. DIAGNOSIS To check if you have hypertension, your health care provider will measure your blood pressure while you are seated, with your arm held at the level of your heart. It should be measured at least twice using the same arm. Certain conditions can cause a difference in blood pressure between your right and left arms. A blood pressure reading that is higher than normal on one occasion does not mean that you need treatment. If it is not clear whether you have high blood pressure, you may be asked to return on a different day to have your blood pressure checked again. Or, you may be asked to monitor your blood pressure at home for 1 or more weeks. TREATMENT Treating high blood pressure includes making lifestyle changes and possibly taking medicine. Living a healthy lifestyle can help lower high blood pressure. You may need to change some of your habits. Lifestyle changes may include:  Following the DASH diet. This diet is high in fruits, vegetables, and whole grains. It is low in salt, red meat, and added sugars.  Keep your sodium intake below 2,300 mg per day.  Getting at least 30-45 minutes of aerobic exercise at least 4 times per week.  Losing weight if necessary.  Not smoking.  Limiting alcoholic beverages.  Learning ways to reduce stress. Your health care provider may prescribe medicine if  lifestyle changes are not enough to get your blood pressure under control, and if one of the following is true:  You are 58-105 years of age and your systolic blood pressure is above 140.  You are 47 years of age or older, and your systolic blood pressure is above 150.  Your diastolic blood pressure is above 90.  You have diabetes, and your systolic blood pressure is over [id number] or your diastolic blood pressure is over 90.  You have kidney disease and your blood pressure is above 140/90.  You have heart disease and your blood pressure is above 140/90. Your personal target blood pressure may vary depending on your medical conditions, your age, and other factors. HOME CARE INSTRUCTIONS  Have your blood pressure rechecked as directed by your health care provider.   Take medicines only as directed by your health care provider. Follow the directions carefully. Blood pressure medicines must be taken as prescribed. The medicine does not work as well when you skip doses. Skipping doses also puts you at risk for problems.  Do not smoke.   Monitor your blood pressure at home as directed by your health care provider. SEEK MEDICAL CARE IF:   You  think you are having a reaction to medicines taken.  You have recurrent headaches or feel dizzy.  You have swelling in your ankles.  You have trouble with your vision. SEEK IMMEDIATE MEDICAL CARE IF:  You develop a severe headache or confusion.  You have unusual weakness, numbness, or feel faint.  You have severe chest or abdominal pain.  You vomit repeatedly.  You have trouble breathing. MAKE SURE YOU:   Understand these instructions.  Will watch your condition.  Will get help right away if you are not doing well or get worse.   This information is not intended to replace advice given to you by your health care provider. Make sure you discuss any questions you have with your health care provider.   Document Released: 07/23/2005  Document Revised: 12/07/2014 Document Reviewed: 05/15/2013 Elsevier Interactive Patient Education [employer].   Your exam and labs are normal today. You are likely retaining some excess fluid due to recent increase in salt intake. You should monitor you intake of high salt (sodium) foods and added salt. Take the fluid pill as directed, and monitor your blood pressure. Follow-up with Dr. Rosario Jacks for further management of blood pressure next week.

## 2015-08-20 NOTE — ED Notes (Signed)
NAD noted at time of D/C. Pt denies questions or concerns. Pt ambulatory to the lobby at this time.  

## 2015-08-20 NOTE — ED Notes (Signed)
Pt to ed with c/o htn and bilat hand swelling.  Pt states she was seen at pain clinic this week and her blood pressure was elevated.   Pt states, "My hands are tight, my right arm has sharp pain and burning in it"  Pt BP is 154/85.  Pt reports she is currently using oxycodone.

## 2015-08-21 NOTE — ED Provider Notes (Signed)
[facility] Emergency Department Provider Note ____________________________________________  Time seen: 1530  I have reviewed the triage vital signs and the nursing notes.  HISTORY  Chief Complaint  Arm Pain  HPI Gabriella Robinson is a 55 y.o. female sensitive the ED with complaints of intermittent hand swelling and tightness for the last 2 weeks. She describes a sense of head tightness the palms primarily over the last 2 weeks. She describes the pain as burning in nature. She denies any injury or trauma limited to the hands. She does admit to about a week of increase table salt intake for the purpose of reducing some cramping in her legs. She was advised by a friend that eating salt would relieve her leg cramps. She denies ill fitting rings or jewelry and denies any swelling to the legs or feet. She also is concerned for some elevated blood pressures recently. She reports her typical systolic blood pressure is usually around 120s and 130s. She currently doses lisinopril 10 mg daily for hypertension. She otherwise denies any chest pain shortness of breath, lower extremity edema, or grip strength changes. She is not followed up with her primary care provider for evaluation of these symptoms since the onset.  Past Medical History  Diagnosis Date  . Diabetes ([place])   . MS (multiple sclerosis) ([place])   . HBP (high blood pressure)   . Sinus complaint   . History of blood clots   . Migraines   . Sleep apnea   . Lumbar canal stenosis 11/12/2013  . Chronic back pain     Patient Active Problem List   Diagnosis Date Noted  . Neurogenic pain 08/17/2015  . Fibromyalgia 08/17/2015  . Nocturnal muscle cramps (Bilateral lower extremities) 08/17/2015  . Chronic pain 07/21/2015  . Long term current use of opiate analgesic 07/21/2015  . Long term prescription opiate use 07/21/2015  . Opiate use 07/21/2015  . Opioid dependence, daily use ([place]) 07/21/2015  . Uncomplicated opioid  dependence ([place]) 07/21/2015  . Adiposity 07/21/2015  . Chronic neck pain (Location of Primary Source of Pain) (Right) 07/21/2015  . Chronic cervical radicular pain (Right) (C7/C8 Dermatome) 07/21/2015  . Chronic low back pain (Left) 07/21/2015  . Neuropathic pain 07/21/2015  . Musculoskeletal pain 07/21/2015  . Myofascial pain 07/21/2015  . Diffuse myofascial pain syndrome 07/21/2015  . Restless leg syndrome 07/21/2015  . Carpal tunnel syndrome 06/09/2015  . Migraine without aura and responsive to treatment 04/21/2015  . Multiple sclerosis ([place]) 10/04/2014  . Hand paresthesia 10/04/2014  . Disordered sleep 10/04/2014  . Enthesopathy of hip (Left) 11/12/2013  . Neuropathy ([place]) 11/12/2013    Past Surgical History  Procedure Laterality Date  . Eye surgery Bilateral   . Ectopic pregnancy surgery    . Abdominal hysterectomy    . Back surgery    . Foot surgery Bilateral     Current Outpatient Rx  Name  Route  Sig  Dispense  Refill  . acetaminophen (TYLENOL) 500 MG tablet   Oral   Take 1,000 mg by mouth every 4 (four) hours as needed for pain.         . Cholecalciferol (VITAMIN D3) 5000 UNITS TABS   Oral   Take by mouth.         . esomeprazole (NEXIUM) 40 MG capsule   Oral   Take 40 mg by mouth daily before breakfast.         . gabapentin (NEURONTIN) 800 MG tablet   Oral  Take 1 tablet (800 mg total) by mouth 4 (four) times daily.   120 tablet   5     Dispense as written.    Do not place this medication, or any other prescri ...   . glipiZIDE (GLUCOTROL XL) 5 MG 24 hr tablet      TK 1 T PO QD      3   . hydrochlorothiazide (MICROZIDE) 12.5 MG capsule   Oral   Take 1 capsule (12.5 mg total) by mouth every morning.   30 capsule   0   . interferon beta-1a (REBIF) 22 MCG/0.5ML injection   Subcutaneous   Inject 22 mcg into the skin 3 (three) times a week. Monday Wednesday and Friday         . lisinopril (PRINIVIL,ZESTRIL) 10 MG tablet   Oral   Take  10 mg by mouth daily.         . meloxicam (MOBIC) 15 MG tablet   Oral   Take 1 tablet (15 mg total) by mouth daily.   90 tablet   3   . ONETOUCH VERIO test strip      TEST BLOOD SUGAR TID      5     Dispense as written.   Marland Kitchen oxybutynin (OXYTROL) 3.9 MG/24HR      as needed.         . Oxycodone HCl 10 MG TABS   Oral   Take 1 tablet (10 mg total) by mouth every 6 (six) hours as needed.   120 tablet   0     Do not place this medication, or any other prescri ...   . sitaGLIPtin-metformin (JANUMET) 50-500 MG tablet   Oral   Take by mouth.         . topiramate (TOPAMAX) 50 MG tablet   Oral   Take 50 mg by mouth 2 (two) times daily.          . traZODone (DESYREL) 50 MG tablet      TAKE 1 TO 2 TABLETS BY MOUTH EVERY NIGHT FOR SLEEP           Allergies Review of patient's allergies indicates no known allergies.  History reviewed. No pertinent family history.  Social History Social History  Substance Use Topics  . Smoking status: Never Smoker   . Smokeless tobacco: Never Used  . Alcohol Use: No   Review of Systems  Constitutional: Negative for fever. Reports elevated blood pressures above baseline. Eyes: Negative for visual changes. ENT: Negative for sore throat. Cardiovascular: Negative for chest pain. Respiratory: Negative for shortness of breath. Gastrointestinal: Negative for abdominal pain, vomiting and diarrhea. Genitourinary: Negative for dysuria. Musculoskeletal: Negative for back pain. Skin: Negative for rash. Hand tightness as above. Neurological: Negative for headaches, focal weakness or numbness. ____________________________________________  PHYSICAL EXAM:  VITAL SIGNS: ED Triage Vitals  Enc Vitals Group     BP 08/20/15 1430 154/85 mmHg     Pulse Rate 08/20/15 1430 101     Resp 08/20/15 1430 20     Temp 08/20/15 1430 97.6 F (36.4 C)     Temp Source 08/20/15 1430 Oral     SpO2 08/20/15 1430 100 %     Weight 08/20/15 1430 265 lb  (120.203 kg)     Height 08/20/15 1430 5\' 8"  (1.727 m)     Head Cir --      Peak Flow --      Pain Score 08/20/15 1431 9  Pain Loc --      Pain Edu? --      Excl. in [place]? --    Constitutional: Alert and oriented. Well appearing and in no distress. Head: Normocephalic and atraumatic.      Eyes: Conjunctivae are normal. PERRL. Normal extraocular movements      Ears: Canals clear. TMs intact bilaterally.   Nose: No congestion/rhinorrhea.   Mouth/Throat: Mucous membranes are moist.   Neck: Supple. No thyromegaly. Hematological/Lymphatic/Immunological: No cervical lymphadenopathy. Cardiovascular: Normal rate, regular rhythm.  Respiratory: Normal respiratory effort. No wheezes/rales/rhonchi. Gastrointestinal: Soft and nontender. No distention. Musculoskeletal: Nontender with normal range of motion in all extremities.  Neurologic:  Cranial nerves II through XII grossly intact. Normal UE DTRs bilaterally. Normal intrinsic and opposition testing. Normal gait without ataxia. Normal speech and language. No gross focal neurologic deficits are appreciated. Skin:  Skin is warm, dry and intact. No rash noted. Patient without any obvious edema to the hands. There is no skin peeling and cracking associated with her symptoms. She is wearing 2 large rings and a large watch her hands without difficulty. Psychiatric: Mood and affect are normal. Patient exhibits appropriate insight and judgment. ____________________________________________   LABS (pertinent positives/negatives) Labs Reviewed  BASIC METABOLIC PANEL - Abnormal; Notable for the following:    Glucose, Bld 212 (*)    All other components within normal limits  CBC WITH DIFFERENTIAL/PLATELET  ____________________________________________  INITIAL IMPRESSION / ASSESSMENT AND PLAN / ED COURSE  Patient with complaints of bilateral hand swelling and elevated blood pressure. Her symptoms may be related to some fluid retention. Her labs  are normal today without any indication of electrolyte imbalance or kidney dysfunction. She is encouraged to discontinue any added salt or salt supplements at this time. She is to continue with the daily lisinopril and a low-dose HCTZ will be provided for daily dosing to help reduce hand swelling and fluid retention. She will follow with her primary care provider next week for further evaluation. ____________________________________________  FINAL CLINICAL IMPRESSION(S) / ED DIAGNOSES  Final diagnoses:  Bilateral hand swelling  Fluid retention  Elevated BP      Melvenia Needles, PA-C 08/21/15 0028  Nena Polio, MD 08/21/15 [phone]

## 2015-08-22 ENCOUNTER — Ambulatory Visit: Payer: Medicare Other | Admitting: Podiatry

## 2015-08-25 LAB — TOXASSURE SELECT 13 (MW), URINE: PDF: 0

## 2015-09-12 ENCOUNTER — Ambulatory Visit: Payer: Medicare Other | Admitting: Podiatry

## 2015-09-15 NOTE — Progress Notes (Signed)
Quick Note:  NOTE: This forensic urine drug screen (UDS) test was conducted using a state-of-the-art ultra high performance liquid chromatography and mass spectrometry system (UPLC/MS-MS), the most sophisticated and accurate method available. UPLC/MS-MS is 1,000 times more precise and accurate than standard gas chromatography and mass spectrometry (GC/MS). This system can analyze 26 drug categories and 180 drug compounds.  The results of this test came back with unexpected findings. However, the ethyl alcohol found to be secondary to the patient's diabetes. ______

## 2015-09-16 ENCOUNTER — Other Ambulatory Visit: Payer: Self-pay | Admitting: Orthopedic Surgery

## 2015-09-16 DIAGNOSIS — M7582 Other shoulder lesions, left shoulder: Secondary | ICD-10-CM

## 2015-09-19 ENCOUNTER — Encounter: Payer: Self-pay | Admitting: Pain Medicine

## 2015-09-19 ENCOUNTER — Ambulatory Visit: Payer: Medicare Other | Attending: Pain Medicine | Admitting: Pain Medicine

## 2015-09-19 ENCOUNTER — Other Ambulatory Visit: Payer: Self-pay | Admitting: Pain Medicine

## 2015-09-19 VITALS — BP 128/68 | HR 92 | Temp 97.9°F | Resp 18 | Ht 68.5 in | Wt 249.0 lb

## 2015-09-19 DIAGNOSIS — Z79891 Long term (current) use of opiate analgesic: Secondary | ICD-10-CM

## 2015-09-19 DIAGNOSIS — M792 Neuralgia and neuritis, unspecified: Secondary | ICD-10-CM

## 2015-09-19 DIAGNOSIS — F1190 Opioid use, unspecified, uncomplicated: Secondary | ICD-10-CM | POA: Insufficient documentation

## 2015-09-19 DIAGNOSIS — I10 Essential (primary) hypertension: Secondary | ICD-10-CM | POA: Insufficient documentation

## 2015-09-19 DIAGNOSIS — G8929 Other chronic pain: Secondary | ICD-10-CM | POA: Diagnosis not present

## 2015-09-19 DIAGNOSIS — E669 Obesity, unspecified: Secondary | ICD-10-CM | POA: Insufficient documentation

## 2015-09-19 DIAGNOSIS — G4730 Sleep apnea, unspecified: Secondary | ICD-10-CM | POA: Diagnosis not present

## 2015-09-19 DIAGNOSIS — G43909 Migraine, unspecified, not intractable, without status migrainosus: Secondary | ICD-10-CM | POA: Insufficient documentation

## 2015-09-19 DIAGNOSIS — E559 Vitamin D deficiency, unspecified: Secondary | ICD-10-CM | POA: Insufficient documentation

## 2015-09-19 DIAGNOSIS — M542 Cervicalgia: Secondary | ICD-10-CM

## 2015-09-19 DIAGNOSIS — G35 Multiple sclerosis: Secondary | ICD-10-CM | POA: Insufficient documentation

## 2015-09-19 DIAGNOSIS — G5600 Carpal tunnel syndrome, unspecified upper limb: Secondary | ICD-10-CM | POA: Insufficient documentation

## 2015-09-19 DIAGNOSIS — M47816 Spondylosis without myelopathy or radiculopathy, lumbar region: Secondary | ICD-10-CM | POA: Insufficient documentation

## 2015-09-19 DIAGNOSIS — E119 Type 2 diabetes mellitus without complications: Secondary | ICD-10-CM | POA: Insufficient documentation

## 2015-09-19 DIAGNOSIS — M797 Fibromyalgia: Secondary | ICD-10-CM | POA: Insufficient documentation

## 2015-09-19 DIAGNOSIS — Z5181 Encounter for therapeutic drug level monitoring: Secondary | ICD-10-CM | POA: Insufficient documentation

## 2015-09-19 DIAGNOSIS — M545 Low back pain: Secondary | ICD-10-CM

## 2015-09-19 MED ORDER — OXYCODONE HCL 10 MG PO TABS: 10.0000 mg | ORAL_TABLET | Freq: Four times a day (QID) | ORAL | Status: DC | PRN

## 2015-09-19 MED ORDER — GABAPENTIN 800 MG PO TABS: 800.0000 mg | ORAL_TABLET | Freq: Four times a day (QID) | ORAL | Status: DC

## 2015-09-19 NOTE — Progress Notes (Signed)
August 20, 2015 went to the ED due to fluid retention, bilateral hand swelling. Was started on Hydrochlorothiazide. Had left shoulder pain, seen by Dr. Melrose Nakayama and received a steroid shot in the left shoulder on September 07, 2015. An MRI was ordered for the left shoulder. Pill count: Oxycodone - #25

## 2015-09-19 NOTE — Patient Instructions (Addendum)
A prescription for GABAPENTIN was sent to your pharmacy and should be available for pickup today.  A prescription for OXYCODONE was given to you today.  Please have lab work done as soon as possible.  Facet Blocks Patient Information  Description: The facets are joints in the spine between the vertebrae.  Like any joints in the body, facets can become irritated and painful.  Arthritis can also effect the facets.  By injecting steroids and local anesthetic in and around these joints, we can temporarily block the nerve supply to them.  Steroids act directly on irritated nerves and tissues to reduce selling and inflammation which often leads to decreased pain.  Facet blocks may be done anywhere along the spine from the neck to the low back depending upon the location of your pain.   After numbing the skin with local anesthetic (like Novocaine), a small needle is passed onto the facet joints under x-ray guidance.  You may experience a sensation of pressure while this is being done.  The entire block usually lasts about 15-25 minutes.   Conditions which may be treated by facet blocks:   Low back/buttock pain  Neck/shoulder pain  Certain types of headaches  Preparation for the injection:  1. Do not eat any solid food or dairy products within 6 hours of your appointment. 2. You may drink clear liquid up to 2 hours before appointment.  Clear liquids include water, black coffee, juice or soda.  No milk or cream please. 3. You may take your regular medication, including pain medications, with a sip of water before your appointment.  Diabetics should hold regular insulin (if taken separately) and take 1/2 normal NPH dose the morning of the procedure.  Carry some sugar containing items with you to your appointment. 4. A driver must accompany you and be prepared to drive you home after your procedure. 5. Bring all your current medications with you. 6. An IV may be inserted and sedation may be given at  the discretion of the physician. 7. A blood pressure cuff, EKG and other monitors will often be applied during the procedure.  Some patients may need to have extra oxygen administered for a short period. 8. You will be asked to provide medical information, including your allergies and medications, prior to the procedure.  We must know immediately if you are taking blood thinners (like Coumadin/Warfarin) or if you are allergic to IV iodine contrast (dye).  We must know if you could possible be pregnant.  Possible side-effects:   Bleeding from needle site  Infection (rare, may require surgery)  Nerve injury (rare)  Numbness & tingling (temporary)  Difficulty urinating (rare, temporary)  Spinal headache (a headache worse with upright posture)  Light-headedness (temporary)  Pain at injection site (serveral days)  Decreased blood pressure (rare, temporary)  Weakness in arm/leg (temporary)  Pressure sensation in back/neck (temporary)   Call if you experience:   Fever/chills associated with headache or increased back/neck pain  Headache worsened by an upright position  New onset, weakness or numbness of an extremity below the injection site  Hives or difficulty breathing (go to the emergency room)  Inflammation or drainage at the injection site(s)  Severe back/neck pain greater than usual  New symptoms which are concerning to you  Please note:  Although the local anesthetic injected can often make your back or neck feel good for several hours after the injection, the pain will likely return. It takes 3-7 days for steroids to work.  You may not notice any pain relief for at least one week.  If effective, we will often do a series of 2-3 injections spaced 3-6 weeks apart to maximally decrease your pain.  After the initial series, you may be a candidate for a more permanent nerve block of the facets.  If you have any questions, please call #[phone]) [facility]

## 2015-09-19 NOTE — Progress Notes (Signed)
Patient's Name: Gabriella Robinson MRN: [id number] DOB: July 11, 1961 DOS: 09/19/2015  Primary Reason(s) for Visit: Encounter for Medication Management CC: Pain   HPI  Ms. Toll is a 55 y.o. year old, female patient, who returns today as an established patient. She has Chronic pain; Long term current use of opiate analgesic; Long term prescription opiate use; Opiate use; Opioid dependence, daily use ([place]); Uncomplicated opioid dependence ([place]); Enthesopathy of hip (Left); Migraine without aura and responsive to treatment; Carpal tunnel syndrome; Multiple sclerosis ([place]); Neuropathy ([place]); Hand paresthesia; Adiposity; Disordered sleep; Chronic neck pain (Location of Primary Source of Pain) (Right); Chronic cervical radicular pain (Right) (C7/C8 Dermatome); Chronic low back pain (Location of Secondary source of pain) (Left); Neuropathic pain; Musculoskeletal pain; Myofascial pain; Diffuse myofascial pain syndrome; Restless leg syndrome; Neurogenic pain; Fibromyalgia; Nocturnal muscle cramps (Bilateral lower extremities); Encounter for therapeutic drug level monitoring; Avitaminosis D; and Lumbar facet syndrome (Location of Secondary source of pain) (Bilateral) (L>R) on her problem list.. Her primarily concern today is the Pain   The patient returns to the clinic today for pharmacological management of her chronic pain. The patient indicates recently having been hospitalized due to swelling of her arms. She was given a D Reddick and she is now doing much better. The patient also indicates that she attempted to get an appointment here, but was unable to do that and therefore she went to the [facility] were [facility] went ahead and gave her an injection into her left shoulder and order an MRI. She is currently being evaluated for this left shoulder pain.  Reported Pain Score: 9 , clinically she looks like a 3-4/10. Reported level is inconsistent with clinical obrservations. Pain Type: Chronic  pain Pain Location: Buttocks Pain Orientation: Left Pain Descriptors / Indicators: Aching, Sharp Pain Frequency: Constant  Date of Last Visit: 08/17/15 Service Provided on Last Visit: Med Refill  Pharmacotherapy  Medication(s): Oxycodone IR 10 mg 1 tablet by mouth every 6 hours when necessary for pain. Onset of action: Within expected pharmacological parameters. (45 minutes) Time to Peak effect: Timing and results are as within normal expected parameters. (1 hour and 30 minutes) Analgesic Effect: 80% Activity Facilitation: Medication(s) allow patient to sit, stand, walk, and do the basic ADLs Perceived Effectiveness: Described as relatively effective, allowing for increase in activities of daily living (ADL) Side-effects or Adverse reactions: None reported Duration of action: Within normal limits for medication. (4 hours) [place] PMP: Compliant with practice rules and regulations UDS Results: The patient's last UDS done on 08/17/2015 came back within normal limits except for an unexpected level of alcohol. No glucose was detected in the specimen. UDS Interpretation: Abnormal results discussed with patient. The patient reminded not to use any alcoholic beverages with the pain medication. Medication Assessment Form: Reviewed. Patient indicates being compliant with therapy Treatment compliance: Compliant Substance Use Disorder (SUD) Risk Level: High Pharmacologic Plan: Continue therapy as is  Lab Work: Illicit Drugs No results found for: THCU, COCAINSCRNUR, PCPSCRNUR, MDMA, AMPHETMU, METHADONE, ETOH  Inflammation Markers No results found for: ESRSEDRATE, CRP  Renal Function Lab Results  Component Value Date   BUN 13 08/20/2015   CREATININE 0.65 08/20/2015   GFRAA >60 08/20/2015   GFRNONAA >60 08/20/2015    Hepatic Function Lab Results  Component Value Date   AST 47* 03/14/2015   ALT 51 03/14/2015   ALBUMIN 3.9 03/14/2015    Electrolytes Lab Results  Component Value Date    NA 140 08/20/2015   K 3.9  08/20/2015   CL 105 08/20/2015   CALCIUM 10.3 08/20/2015   MG 1.6* 01/13/2013    Allergies  [name] has No Known Allergies.  Meds  The patient has a current medication list which includes the following prescription(s): acetaminophen, biotin w/ vitamins c & e, cetirizine, vitamin d3, esomeprazole, gabapentin, glipizide, hydrochlorothiazide, interferon beta-1a, lisinopril, meloxicam, onetouch verio, oxybutynin, oxycodone hcl, ropinirole, sitagliptin-metformin, tizanidine, topiramate, and trazodone.  Current Outpatient Prescriptions on File Prior to Visit  Medication Sig  . acetaminophen (TYLENOL) 500 MG tablet Take 1,000 mg by mouth every 4 (four) hours as needed for pain.  . Cholecalciferol (VITAMIN D3) 5000 UNITS TABS Take by mouth.  . esomeprazole (NEXIUM) 40 MG capsule Take 40 mg by mouth daily before breakfast.  . glipiZIDE (GLUCOTROL XL) 5 MG 24 hr tablet TK 1 T PO QD  . hydrochlorothiazide (MICROZIDE) 12.5 MG capsule Take 1 capsule (12.5 mg total) by mouth every morning.  . interferon beta-1a (REBIF) 22 MCG/0.5ML injection Inject 22 mcg into the skin 3 (three) times a week. Monday Wednesday and Friday  . lisinopril (PRINIVIL,ZESTRIL) 10 MG tablet Take 10 mg by mouth daily.  . meloxicam (MOBIC) 15 MG tablet Take 1 tablet (15 mg total) by mouth daily.  Glory Rosebush VERIO test strip TEST BLOOD SUGAR TID  . oxybutynin (OXYTROL) 3.9 MG/24HR as needed.  . sitaGLIPtin-metformin (JANUMET) 50-500 MG tablet Take 1 tablet by mouth 2 (two) times daily with a meal.   . topiramate (TOPAMAX) 50 MG tablet Take 50 mg by mouth 2 (two) times daily.   . traZODone (DESYREL) 50 MG tablet TAKE 1 TO 2 TABLETS BY MOUTH EVERY NIGHT FOR SLEEP   No current facility-administered medications on file prior to visit.    ROS  Constitutional: Afebrile, no chills, well hydrated and well nourished Gastrointestinal: negative Musculoskeletal:negative Neurological:  negative Behavioral/Psych: negative  PFSH  Medical:  [name]  has a past medical history of Diabetes ([place]); MS (multiple sclerosis) ([place]); HBP (high blood pressure); Sinus complaint; History of blood clots; Migraines; Sleep apnea; Lumbar canal stenosis (11/12/2013); and Chronic back pain. Family: family history is not on file. Surgical:  has past surgical history that includes Eye surgery (Bilateral); Ectopic pregnancy surgery; Abdominal hysterectomy; Back surgery; and Foot surgery (Bilateral). Tobacco:  reports that she has never smoked. She has never used smokeless tobacco. Alcohol:  reports that she does not drink alcohol. Drug:  has no drug history on file.  Physical Exam  Vitals:  Today's Vitals   09/19/15 0958  BP: 128/68  Pulse: 92  Temp: 97.9 F (36.6 C)  TempSrc: Oral  Resp: 18  Height: 5' 8.5" (1.74 m)  Weight: 249 lb (112.946 kg)  SpO2: 100%  PainSc: 9   PainLoc: Buttocks    Calculated BMI: Body mass index is 37.31 kg/(m^2).  General appearance: alert, cooperative, appears stated age and mild distress Eyes: PERLA Respiratory: No evidence respiratory distress, no audible rales or ronchi and no use of accessory muscles of respiration  Cervical Spine Inspection: Normal anatomy Alignment: Symetrical ROM: Adequate  Upper Extremities Inspection: No gross anomalies detected ROM: Adequate Sensory: Normal Motor: Unremarkable  Thoracic Spine Inspection: No gross anomalies detected Alignment: Symetrical ROM: Adequate Palpation: WNL  Lumbar Spine Inspection: No gross anomalies detected Alignment: Symetrical ROM: Decreased Gait: WNL  Lower Extremities Inspection: No gross anomalies detected ROM: Adequate Sensory:  Normal Motor: Unremarkable  Assessment & Plan  Primary Diagnosis & Pertinent Problem List: The primary encounter diagnosis was Chronic pain. Diagnoses  of Multiple sclerosis ([place]), Chronic neck pain (Location of Primary Source of Pain) (Right),  Encounter for therapeutic drug level monitoring, Long term current use of opiate analgesic, Avitaminosis D, Neurogenic pain, Fibromyalgia, and Lumbar facet syndrome (Location of Secondary source of pain) (Bilateral) (L>R) were also pertinent to this visit.  Visit Diagnosis: 1. Chronic pain   2. Multiple sclerosis ([place])   3. Chronic neck pain (Location of Primary Source of Pain) (Right)   4. Encounter for therapeutic drug level monitoring   5. Long term current use of opiate analgesic   6. Avitaminosis D   7. Neurogenic pain   8. Fibromyalgia   9. Lumbar facet syndrome (Location of Secondary source of pain) (Bilateral) (L>R)     Assessment: No problem-specific assessment & plan notes found for this encounter.   Plan of Care  Pharmacotherapy (Medications Ordered): Meds ordered this encounter  Medications  . Oxycodone HCl 10 MG TABS    Sig: Take 1 tablet (10 mg total) by mouth every 6 (six) hours as needed.    Dispense:  120 tablet    Refill:  0    Do not place this medication, or any other prescription from our practice, on "Automatic Refill". Patient may have prescription filled one day early if pharmacy is closed on scheduled refill date. Do not fill until: 09/19/15 To last until: 10/19/15  . gabapentin (NEURONTIN) 800 MG tablet    Sig: Take 1 tablet (800 mg total) by mouth 4 (four) times daily.    Dispense:  120 tablet    Refill:  0    Do not place this medication, or any other prescription from our practice, on "Automatic Refill". Patient may have prescription filled one day early if pharmacy is closed on scheduled refill date.    Lab-work & Procedure Ordered: Orders Placed This Encounter  Procedures  . Drugs of abuse screen w/o alc, rtn urine-sln    Volume: 10 ml(s). Minimum 3 ml of urine is needed. Document temperature of fresh sample. Indications: Long term (current) use of opiate analgesic (Z79.891) Test#: [id number] ([employer]ox[employer] Select-13)  . Comprehensive metabolic panel     Order Specific Question:  Has the patient fasted?    Answer:  No  . C-reactive protein  . Magnesium  . Sedimentation rate  . Vitamin B12    Indication: Bone Pain (M89.9)  . Vitamin D pnl(25-hydrxy+1,25-dihy)-bld    Imaging Ordered: None  Interventional Therapies: Scheduled: The patient is scheduled to return tomorrow for bilateral diagnostic lumbar facet block under fluoroscopic guidance and IV sedation. PRN Procedures: None at this time.    Referral(s) or Consult(s): None at this time.  Medications administered during this visit: Ms. Mceldowney had no medications administered during this visit.  Future Appointments Date Time Provider [place]  09/20/2015 8:20 AM Milinda Pointer, MD [facility] None  10/06/2015 10:00 AM [facility]  10/17/2015 9:20 AM Milinda Pointer, MD [facility] None    Primary Care Physician: Casilda Carls, MD Location: [facility] Note by: Kathlen Brunswick. Dossie Arbour, M.D, DABA, DABAPM, DABPM, DABIPP, FIPP

## 2015-09-20 ENCOUNTER — Ambulatory Visit: Payer: Medicare Other | Attending: Pain Medicine | Admitting: Pain Medicine

## 2015-09-20 ENCOUNTER — Encounter: Payer: Self-pay | Admitting: Pain Medicine

## 2015-09-20 VITALS — BP 139/78 | HR 89 | Temp 98.0°F | Resp 16 | Ht 68.5 in | Wt 249.0 lb

## 2015-09-20 DIAGNOSIS — M47816 Spondylosis without myelopathy or radiculopathy, lumbar region: Secondary | ICD-10-CM | POA: Diagnosis not present

## 2015-09-20 DIAGNOSIS — G5600 Carpal tunnel syndrome, unspecified upper limb: Secondary | ICD-10-CM | POA: Insufficient documentation

## 2015-09-20 DIAGNOSIS — G2581 Restless legs syndrome: Secondary | ICD-10-CM | POA: Diagnosis not present

## 2015-09-20 DIAGNOSIS — M797 Fibromyalgia: Secondary | ICD-10-CM | POA: Diagnosis not present

## 2015-09-20 DIAGNOSIS — M533 Sacrococcygeal disorders, not elsewhere classified: Secondary | ICD-10-CM | POA: Insufficient documentation

## 2015-09-20 DIAGNOSIS — G35 Multiple sclerosis: Secondary | ICD-10-CM | POA: Insufficient documentation

## 2015-09-20 DIAGNOSIS — M545 Low back pain: Secondary | ICD-10-CM

## 2015-09-20 DIAGNOSIS — G629 Polyneuropathy, unspecified: Secondary | ICD-10-CM | POA: Insufficient documentation

## 2015-09-20 DIAGNOSIS — E669 Obesity, unspecified: Secondary | ICD-10-CM | POA: Insufficient documentation

## 2015-09-20 DIAGNOSIS — G8929 Other chronic pain: Secondary | ICD-10-CM

## 2015-09-20 DIAGNOSIS — M549 Dorsalgia, unspecified: Secondary | ICD-10-CM | POA: Diagnosis present

## 2015-09-20 DIAGNOSIS — E559 Vitamin D deficiency, unspecified: Secondary | ICD-10-CM | POA: Diagnosis not present

## 2015-09-20 MED ORDER — LACTATED RINGERS IV SOLN
1000.0000 mL | INTRAVENOUS | Status: AC
  Administered 2015-09-20: 1000 mL via INTRAVENOUS

## 2015-09-20 MED ORDER — TRIAMCINOLONE ACETONIDE 40 MG/ML IJ SUSP
INTRAMUSCULAR | Status: AC
  Filled 2015-09-20: qty 1

## 2015-09-20 MED ORDER — FENTANYL CITRATE (PF) 100 MCG/2ML IJ SOLN
INTRAMUSCULAR | Status: AC
  Filled 2015-09-20: qty 2

## 2015-09-20 MED ORDER — METHYLPREDNISOLONE ACETATE 80 MG/ML IJ SUSP
INTRAMUSCULAR | Status: AC
  Administered 2015-09-20: 10:00:00
  Filled 2015-09-20: qty 1

## 2015-09-20 MED ORDER — ROPIVACAINE HCL 2 MG/ML IJ SOLN
INTRAMUSCULAR | Status: AC
  Administered 2015-09-20: 10:00:00
  Filled 2015-09-20: qty 10

## 2015-09-20 MED ORDER — LIDOCAINE HCL (PF) 1 % IJ SOLN
10.0000 mL | Freq: Once | INTRAMUSCULAR | Status: AC
  Administered 2015-09-20: 10 mL

## 2015-09-20 MED ORDER — LIDOCAINE HCL (PF) 1 % IJ SOLN: 10.0000 mL | Freq: Once | INTRAMUSCULAR | Status: DC

## 2015-09-20 MED ORDER — MIDAZOLAM HCL 5 MG/5ML IJ SOLN: 5.0000 mg | INTRAMUSCULAR | Status: DC

## 2015-09-20 MED ORDER — TRIAMCINOLONE ACETONIDE 40 MG/ML IJ SUSP
40.0000 mg | Freq: Once | INTRAMUSCULAR | Status: AC
  Administered 2015-09-20: 40 mg

## 2015-09-20 MED ORDER — METHYLPREDNISOLONE ACETATE 80 MG/ML IJ SUSP
80.0000 mg | Freq: Once | INTRAMUSCULAR | Status: AC
  Administered 2015-09-20: 80 mg via INTRA_ARTICULAR

## 2015-09-20 MED ORDER — ROPIVACAINE HCL 2 MG/ML IJ SOLN
9.0000 mL | Freq: Once | INTRAMUSCULAR | Status: AC
  Administered 2015-09-20: 9 mL

## 2015-09-20 MED ORDER — MIDAZOLAM HCL 5 MG/5ML IJ SOLN
INTRAMUSCULAR | Status: AC
Start: 2015-09-20 — End: 2015-09-20
  Administered 2015-09-20: 2 mg via INTRAVENOUS
  Filled 2015-09-20: qty 5

## 2015-09-20 MED ORDER — ROPIVACAINE HCL 2 MG/ML IJ SOLN
INTRAMUSCULAR | Status: AC
  Filled 2015-09-20: qty 10

## 2015-09-20 MED ORDER — FENTANYL CITRATE (PF) 100 MCG/2ML IJ SOLN
100.0000 ug | INTRAMUSCULAR | Status: AC
  Administered 2015-09-20: 50 ug via INTRAVENOUS

## 2015-09-20 MED ORDER — ROPIVACAINE HCL 2 MG/ML IJ SOLN
5.0000 mL | Freq: Once | INTRAMUSCULAR | Status: AC
  Administered 2015-09-20: 5 mL

## 2015-09-20 NOTE — Progress Notes (Signed)
Patient's Name: Gabriella Robinson MRN: [id number] DOB: 08/26/1960 DOS: 09/20/2015  Primary Reason(s) for Visit: Interventional Pain Management Treatment. CC: Back Pain   Pre-Procedure Assessment:  Gabriella Robinson is a 55 y.o. year old, female patient, seen today for interventional treatment. She has Chronic pain; Long term current use of opiate analgesic; Long term prescription opiate use; Opiate use; Opioid dependence, daily use ([place]); Uncomplicated opioid dependence ([place]); Enthesopathy of hip (Left); Migraine without aura and responsive to treatment; Carpal tunnel syndrome; Multiple sclerosis ([place]); Neuropathy ([place]); Hand paresthesia; Adiposity; Disordered sleep; Chronic neck pain (Location of Primary Source of Pain) (Right); Chronic cervical radicular pain (Right) (C7/C8 Dermatome); Chronic low back pain (Location of Secondary source of pain) (Left); Neuropathic pain; Musculoskeletal pain; Myofascial pain; Diffuse myofascial pain syndrome; Restless leg syndrome; Neurogenic pain; Fibromyalgia; Nocturnal muscle cramps (Bilateral lower extremities); Encounter for therapeutic drug level monitoring; Avitaminosis D; Lumbar facet syndrome (Location of Secondary source of pain) (Bilateral) (L>R); Chronic sacroiliac joint pain (Left); and Lumbar spondylosis on her problem list.. Her primarily concern today is the Back Pain   Today's Initial Pain Score: 9/10 Reported level of pain is incompatible with clinical obrservations. This may be secondary to a possible lack of understanding on how the pain scale works. Pain Type: Chronic pain Pain Location: Buttocks Pain Orientation: Left Pain Descriptors / Indicators: Aching, Sharp Pain Frequency: Constant  Post-procedure Pain Score: 2   Date of Last Visit: 09/19/15 Service Provided on Last Visit: Evaluation  Verification of the correct person, correct site (including marking of site), and correct procedure were performed and confirmed by the patient.  Today's  Vitals   09/20/15 0947 09/20/15 0957 09/20/15 1007 09/20/15 1017  BP: 157/74 138/70 140/83 139/78  Pulse: 89 90 83 89  Temp:      TempSrc:      Resp: _0 Height:      Weight:      SpO2: 100% 100% 99% 100%  PainSc: Asleep Asleep 3  2   Calculated BMI: Body mass index is 37.31 kg/(m^2). Allergies: She has No Known Allergies.. Primary Diagnosis: Chronic left sacroiliac joint pain [M53.3, G89.29]  Procedure # 1:  Type: Diagnostic Medial Branch Facet Block Region: Lumbar Level: L2, L3, L4, L5, & S1 Medial Branch Level(s) Laterality: Right  Procedure # 2:  Type: Diagnostic Sacroiliac Joint Block Region: Posterior Lumbosacral Level: PSIS (Posterior Superior Iliac Spine) Sacroiliac Joint Laterality: Right  Indications: 1. Chronic sacroiliac joint pain (Left)   2. Lumbar facet syndrome (Location of Secondary source of pain) (Bilateral) (L>R)   3. Lumbar spondylosis, unspecified spinal osteoarthritis     In addition, Gabriella Robinson has Chronic pain; Enthesopathy of hip (Left); Multiple sclerosis ([place]); Neuropathy ([place]); Hand paresthesia; Chronic neck pain (Location of Primary Source of Pain) (Right); Chronic cervical radicular pain (Right) (C7/C8 Dermatome); Chronic low back pain (Location of Secondary source of pain) (Left); Neuropathic pain; Musculoskeletal pain; Myofascial pain; Diffuse myofascial pain syndrome; Restless leg syndrome; Neurogenic pain; Fibromyalgia; Nocturnal muscle cramps (Bilateral lower extremities); Lumbar facet syndrome (Location of Secondary source of pain) (Bilateral) (L>R); Chronic sacroiliac joint pain (Left); and Lumbar spondylosis on her pertinent problem list.  Consent: Secured. Under the influence of no sedatives a written informed consent was obtained, after having provided information on the risks and possible complications. To fulfill our ethical and legal obligations, as recommended by the American Medical Association's Code of Ethics, we have  provided information to the patient about our clinical impression; the nature and purpose  of the treatment or procedure; the risks, benefits, and possible complications of the intervention; alternatives; the risk(s) and benefit(s) of the alternative treatment(s) or procedure(s); and the risk(s) and benefit(s) of doing nothing. The patient was provided information about the risks and possible complications associated with the procedure. In the case of spinal procedures these may include, but are not limited to, failure to achieve desired goals, infection, bleeding, organ or nerve damage, allergic reactions, paralysis, and death. In addition, the patient was informed that Medicine is not an exact science; therefore, there is also the possibility of unforeseen risks and possible complications that may result in a catastrophic outcome. The patient indicated having understood very clearly. We have given the patient no guarantees and we have made no promises. Enough time was given to the patient to ask questions, all of which were answered to the patient's satisfaction.  Pre-Procedure Preparation: Safety Precautions: Allergies reviewed. Appropriate site, procedure, and patient were confirmed by following the Joint Commission's Universal Protocol (UP.01.01.01), in the form of a "Time Out". The patient was asked to confirm marked site and procedure, before commencing. The patient was asked about blood thinners, or active infections, both of which were denied. Patient was assessed for positional comfort and all pressure points were checked before starting procedure. Monitoring:  As per clinic protocol. Infection Control Precautions: Sterile technique used. Standard Universal Precautions were taken as recommended by the Department of [place] for Disease Control and Prevention (CDC). Standard pre-surgical skin prep was conducted. Respiratory hygiene and cough etiquette was practiced. Hand hygiene observed.  Safe injection practices and needle disposal techniques followed. SDV (single dose vial) medications used. Medications properly checked for expiration dates and contaminants. Personal protective equipment (PPE) used: Sterile double glove technique. Radiation resistant gloves. Sterile surgical gloves.  Anesthesia, Analgesia, Anxiolysis:  Type: Moderate (Conscious) Sedation & Local Anesthesia Local Anesthetic: Lidocaine 1% Route: Intravenous (IV) IV Access: Secured Sedation: Meaningful verbal contact was maintained at all times during the procedure  Indication(s): Analgesia    Description of Procedure #1 Process:   Time-out: "Time-out" completed before starting procedure, as per protocol. Position: Prone Target Area: For Lumbar Facet blocks, the target is the groove formed by the junction of the transverse process and superior articular process. For the L5 dorsal ramus, the target is the notch between superior articular process and sacral ala. For the S1 dorsal ramus, the target is the superior and lateral edge of the posterior S1 Sacral foramen. Approach: Paramedial approach. Area Prepped: Entire Posterior Lumbosacral Region Prepping solution: ChloraPrep (2% chlorhexidine gluconate and 70% isopropyl alcohol) Safety Precautions: Aspiration looking for blood return was conducted prior to all injections. At no point did we inject any substances, as a needle was being advanced. No attempts were made at seeking any paresthesias. Safe injection practices and needle disposal techniques used. Medications properly checked for expiration dates. SDV (single dose vial) medications used.   Description of the Procedure: Protocol guidelines were followed. The patient was placed in position over the fluoroscopy table. The target area was identified and the area prepped in the usual manner. Skin desensitized using vapocoolant spray. Skin & deeper tissues infiltrated with local anesthetic. Appropriate amount of time  allowed to pass for local anesthetics to take effect. The procedure needle was introduced through the skin, ipsilateral to the reported pain, and advanced to the target area. Employing the "Medial Branch Technique", the needles were advanced to the angle made by the superior and medial portion of the transverse process, and  the lateral and inferior portion of the superior articulating process of the targeted vertebral bodies. This area is known as "Burton's Eye" or the "Eye of the [place] Dog". A procedure needle was introduced through the skin, and this time advanced to the angle made by the superior and medial border of the sacral ala, and the lateral border of the S1 vertebral body. This last needle was later repositioned at the superior and lateral border of the posterior S1 foramen. Negative aspiration confirmed. Solution injected in intermittent fashion, asking for systemic symptoms every 0.5cc of injectate. The needles were then removed and the area cleansed, making sure to leave some of the prepping solution back to take advantage of its long term bactericidal properties. Materials & Medications Used:  Needle(s) Used: 22g - 5" Spinal Needle(s)  Description of Procedure # 2 Process:   Time-out: "Time-out" completed before starting procedure, as per protocol. Position: Prone Target Area: For upper sacroiliac joint block(s), the target is the superior and posterior margin of the sacroiliac joint. Approach: Ipsilateral approach. Area Prepped: Entire Posterior Lumbosacral Region Prepping solution: Duraprep (Iodine Povacrylex [0.7% available Iodine] and Isopropyl Alcohol, 74% w/w) Safety Precautions: Aspiration looking for blood return was conducted prior to all injections. At no point did we inject any substances, as a needle was being advanced. No attempts were made at seeking any paresthesias. Safe injection practices and needle disposal techniques used. Medications properly checked for expiration  dates. SDV (single dose vial) medications used. Description of the Procedure: Protocol guidelines were followed. The patient was placed in position over the fluoroscopy table. The target area was identified and the area prepped in the usual manner. Skin desensitized using vapocoolant spray. Skin & deeper tissues infiltrated with local anesthetic. Appropriate amount of time allowed to pass for local anesthetics to take effect. The procedure needle was advanced under fluoroscopic guidance into the sacroiliac joint until a firm endpoint was obtained. Proper needle placement secured. Negative aspiration confirmed. Solution injected in intermittent fashion, asking for systemic symptoms every 0.5cc of injectate. The needles were then removed and the area cleansed, making sure to leave some of the prepping solution back to take advantage of its long term bactericidal properties. Materials & Medications Used:  Needle(s) Used: 22g - 5" Spinal Needle(s)  EBL: None Medications Administered today: We administered fentaNYL, lactated ringers, ropivacaine (PF) 2 mg/ml (0.2%), triamcinolone acetonide, methylPREDNISolone acetate, ropivacaine (PF) 2 mg/ml (0.2%), lidocaine (PF), midazolam, methylPREDNISolone acetate, and ropivacaine (PF) 2 mg/ml (0.2%).Please see chart orders for dosing details.  Imaging Guidance:   Type of Imaging Technique: Fluoroscopy Guidance (Spinal) Indication(s): Assistance in needle guidance and placement for procedures requiring needle placement in or near specific anatomical locations not easily accessible without such assistance. Exposure Time: Please see nurses notes. Contrast: None required. Fluoroscopic Guidance: I was personally present in the fluoroscopy suite, where the patient was placed in position for the procedure, over the fluoroscopy-compatible table. Fluoroscopy was manipulated, using "Tunnel Vision Technique", to obtain the best possible view of the target area, on the affected  side. Parallax error was corrected before commencing the procedure. A "direction-depth-direction" technique was used to introduce the needle under continuous pulsed fluoroscopic guidance. Once the target was reached, antero-posterior, oblique, and lateral fluoroscopic projection views were taken to confirm needle placement in all planes. Permanently recorded images stored by scanning into EMR. Interpretation: Intraoperative imaging interpretation by performing Physician. Adequate needle placement confirmed. Adequate needle placement confirmed in AP, lateral, & Oblique Views. No contrast injected.  Antibiotics:  Type:  Antibiotics Given (last 72 hours)    None      Indication(s): No indications identified.  Post-operative Assessment:   Complications: No immediate post-treatment complications were observed. Disposition: Return to clinic for follow-up evaluation. The patient tolerated the entire procedure well. A repeat set of vitals were taken after the procedure and the patient was kept under observation following institutional policy, for this procedure. Post-procedural neurological assessment was performed, showing return to baseline, prior to discharge. The patient was discharged home, once institutional criteria were met. The patient was provided with post-procedure discharge instructions, including a section on how to identify potential problems. Should any problems arise concerning this procedure, the patient was given instructions to immediately contact us, at any time, without hesitation. In any case, we plan to contact the patient by telephone for a follow-up status report regarding this interventional procedure. Comments:  No additional relevant information.  Primary Care Physician: Casilda Carls, MD Location: [facility] Note by: Kathlen Brunswick. Dossie Arbour, M.D, DABA, DABAPM, DABPM, DABIPP, FIPP  Disclaimer:  Medicine is not an exact science. The only guarantee  in medicine is that nothing is guaranteed. It is important to note that the decision to proceed with this intervention was based on the information collected from the patient. The Data and conclusions were drawn from the patient's questionnaire, the interview, and the physical examination. Because the information was provided in large part by the patient, it cannot be guaranteed that it has not been purposely or unconsciously manipulated. Every effort has been made to obtain as much relevant data as possible for this evaluation. It is important to note that the conclusions that lead to this procedure are derived in large part from the available data. Always take into account that the treatment will also be dependent on availability of resources and existing treatment guidelines, considered by other Pain Management Practitioners as being common knowledge and practice, at the time of the intervention. For Medico-Legal purposes, it is also important to point out that variation in procedural techniques and pharmacological choices are the acceptable norm. The indications, contraindications, technique, and results of the above procedure should only be interpreted and judged by a Board-Certified Interventional Pain Specialist with extensive familiarity and expertise in the same exact procedure and technique. Attempts at providing opinions without similar or greater experience and expertise than that of the treating physician will be considered as inappropriate and unethical, and shall result in a formal complaint to the state medical board and applicable specialty societies.

## 2015-09-20 NOTE — Patient Instructions (Signed)
Pain Management Discharge Instructions  General Discharge Instructions :  If you need to reach your doctor call: Monday-Friday 8:00 am - 4:00 pm at [phone] or toll free [phone].  After clinic hours [phone] to have operator reach doctor.  Bring all of your medication bottles to all your appointments in the pain clinic.  To cancel or reschedule your appointment with Pain Management please remember to call 24 hours in advance to avoid a fee.  Refer to the educational materials which you have been given on: General Risks, I had my Procedure. Discharge Instructions, Post Sedation.  Post Procedure Instructions:  The drugs you were given will stay in your system until tomorrow, so for the next 24 hours you should not drive, make any legal decisions or drink any alcoholic beverages.  You may eat anything you prefer, but it is better to start with liquids then soups and crackers, and gradually work up to solid foods.  Please notify your doctor immediately if you have any unusual bleeding, trouble breathing or pain that is not related to your normal pain.  Depending on the type of procedure that was done, some parts of your body may feel week and/or numb.  This usually clears up by tonight or the next day.  Walk with the use of an assistive device or accompanied by an adult for the 24 hours.  You may use ice on the affected area for the first 24 hours.  Put ice in a Ziploc bag and cover with a towel and place against area 15 minutes on 15 minutes off.  You may switch to heat after 24 hours.Sacroiliac (SI) Joint Injection Patient Information  Description: The sacroiliac joint connects the scrum (very low back and tailbone) to the ilium (a pelvic bone which also forms half of the hip joint).  Normally this joint experiences very little motion.  When this joint becomes inflamed or unstable low back and or hip and pelvis pain may result.  Injection of this joint with local anesthetics  (numbing medicines) and steroids can provide diagnostic information and reduce pain.  This injection is performed with the aid of x-ray guidance into the tailbone area while you are lying on your stomach.   You may experience an electrical sensation down the leg while this is being done.  You may also experience numbness.  We also may ask if we are reproducing your normal pain during the injection.  Conditions which may be treated SI injection:   Low back, buttock, hip or leg pain  Preparation for the Injection:  1. Do not eat any solid food or dairy products within 6 hours of your appointment.  2. You may drink clear liquids up to 2 hours before appointment.  Clear liquids include water, black coffee, juice or soda.  No milk or cream please. 3. You may take your regular medications, including pain medications with a sip of water before your appointment.  Diabetics should hold regular insulin (if take separately) and take 1/2 normal NPH dose the morning of the procedure.  Carry some sugar containing items with you to your appointment. 4. A driver must accompany you and be prepared to drive you home after your procedure. 5. Bring all of your current medications with you. 6. An IV may be inserted and sedation may be given at the discretion of the physician. 7. A blood pressure cuff, EKG and other monitors will often be applied during the procedure.  Some patients may need to have extra oxygen  administered for a short period.  8. You will be asked to provide medical information, including your allergies, prior to the procedure.  We must know immediately if you are taking blood thinners (like Coumadin/Warfarin) or if you are allergic to IV iodine contrast (dye).  We must know if you could possible be pregnant.  Possible side effects:   Bleeding from needle site  Infection (rare, may require surgery)  Nerve injury (rare)  Numbness & tingling (temporary)  A brief convulsion or  seizure  Light-headedness (temporary)  Pain at injection site (several days)  Decreased blood pressure (temporary)  Weakness in the leg (temporary)   Call if you experience:   New onset weakness or numbness of an extremity below the injection site that last more than 8 hours.  Hives or difficulty breathing ( go to the emergency room)  Inflammation or drainage at the injection site  Any new symptoms which are concerning to you  Please note:  Although the local anesthetic injected can often make your back/ hip/ buttock/ leg feel good for several hours after the injections, the pain will likely return.  It takes 3-7 days for steroids to work in the sacroiliac area.  You may not notice any pain relief for at least that one week.  If effective, we will often do a series of three injections spaced 3-6 weeks apart to maximally decrease your pain.  After the initial series, we generally will wait some months before a repeat injection of the same type.  If you have any questions, please call [phone] [facility]  Facet Joint Block The facet joints connect the bones of the spine (vertebrae). They make it possible for you to bend, twist, and make other movements with your spine. They also prevent you from overbending, overtwisting, and making other excessive movements.  A facet joint block is a procedure where a numbing medicine (anesthetic) is injected into a facet joint. Often, a type of anti-inflammatory medicine called a steroid is also injected. A facet joint block may be done for two reasons:  2. Diagnosis. A facet joint block may be done as a test to see whether neck or back pain is caused by a worn-down or infected facet joint. If the pain gets better after a facet joint block, it means the pain is probably coming from the facet joint. If the pain does not get better, it means the pain is probably not coming from the facet joint.  3. Therapy. A  facet joint block may be done to relieve neck or back pain caused by a facet joint. A facet joint block is only done as a therapy if the pain does not improve with medicine, exercise programs, physical therapy, and other forms of pain management. LET [facility] CARE PROVIDER KNOW ABOUT:  9. Any allergies you have.  10. All medicines you are taking, including vitamins, herbs, eyedrops, and over-the-counter medicines and creams.  11. Previous problems you or members of your family have had with the use of anesthetics.  12. Any blood disorders you have had.  13. Other health problems you have. RISKS AND COMPLICATIONS Generally, having a facet joint block is safe. However, as with any procedure, complications can occur. Possible complications associated with having a facet joint block include:  10. Bleeding.  11. Injury to a nerve near the injection site.  12. Pain at the injection site.  13. Weakness or numbness in areas controlled by nerves near the injection  site.  14. Infection.  15. Temporary fluid retention.  16. Allergic reaction to anesthetics or medicines used during the procedure. BEFORE THE PROCEDURE  5. Follow your health care provider's instructions if you are taking dietary supplements or medicines. You may need to stop taking them or reduce your dosage.  6. Do not take any new dietary supplements or medicines without asking your health care provider first.  7. Follow your health care provider's instructions about eating and drinking before the procedure. You may need to stop eating and drinking several hours before the procedure.  8. Arrange to have an adult drive you home after the procedure. PROCEDURE  You may need to remove your clothing and dress in an open-back gown so that your health care provider can access your spine.   The procedure will be done while you are lying on an X-ray table. Most of the time you will be asked to lie on your stomach, but you may be  asked to lie in a different position if an injection will be made in your neck.   Special machines will be used to monitor your oxygen levels, heart rate, and blood pressure.   If an injection will be made in your neck, an intravenous (IV) tube will be inserted into one of your veins. Fluids and medicine will flow directly into your body through the IV tube.   The area over the facet joint where the injection will be made will be cleaned with an antiseptic soap. The surrounding skin will be covered with sterile drapes.   An anesthetic will be applied to your skin to make the injection area numb. You may feel a temporary stinging or burning sensation.   A video X-ray machine will be used to locate the joint. A contrast dye may be injected into the facet joint area to help with locating the joint.   When the joint is located, an anesthetic medicine will be injected into the joint through the needle.   Your health care provider will ask you whether you feel pain relief. If you do feel relief, a steroid may be injected to provide pain relief for a longer period of time. If you do not feel relief or feel only partial relief, additional injections of an anesthetic may be made in other facet joints.   The needle will be removed, the skin will be cleansed, and bandages will be applied.  AFTER THE PROCEDURE   You will be observed for 15-30 minutes before being allowed to go home. Do not drive. Have an adult drive you or take a taxi or public transportation instead.   If you feel pain relief, the pain will return in several hours or days when the anesthetic wears off.   You may feel pain relief 2-14 days after the procedure. The amount of time this relief lasts varies from person to person.   It is normal to feel some tenderness over the injected area(s) for 2 days following the procedure.   If you have diabetes, you may have a temporary increase in blood sugar.   This information is  not intended to replace advice given to you by your health care provider. Make sure you discuss any questions you have with your health care provider.   Document Released: 12/12/2006 Document Revised: 08/13/2014 Document Reviewed: 05/12/2012 Elsevier Interactive Patient Education [employer].

## 2015-09-20 NOTE — Progress Notes (Signed)
Safety precautions to be maintained throughout the outpatient stay will include: orient to surroundings, keep bed in low position, maintain call bell within reach at all times, provide assistance with transfer out of bed and ambulation.  

## 2015-09-21 ENCOUNTER — Telehealth: Payer: Self-pay | Admitting: *Deleted

## 2015-09-21 NOTE — Telephone Encounter (Signed)
Patient verbalizes no complications from procedure.  She does report a headache and thinks it may be coming from the sedation. States that she had this type of headache before after the sedation, but if she does not have sedation with procedures she does not experience it.  States she has been eating and drinking normally and has used Excedrin and her prescribed pain medication for the headache.

## 2015-09-23 LAB — TOXASSURE SELECT 13 (MW), URINE: PDF: 0

## 2015-09-28 IMAGING — MG SCREENING-BILATERAL MAMMOGRAPHY
5 series · 5 of 5 positions shown · non-contrast
Comparison: none

[R CC]
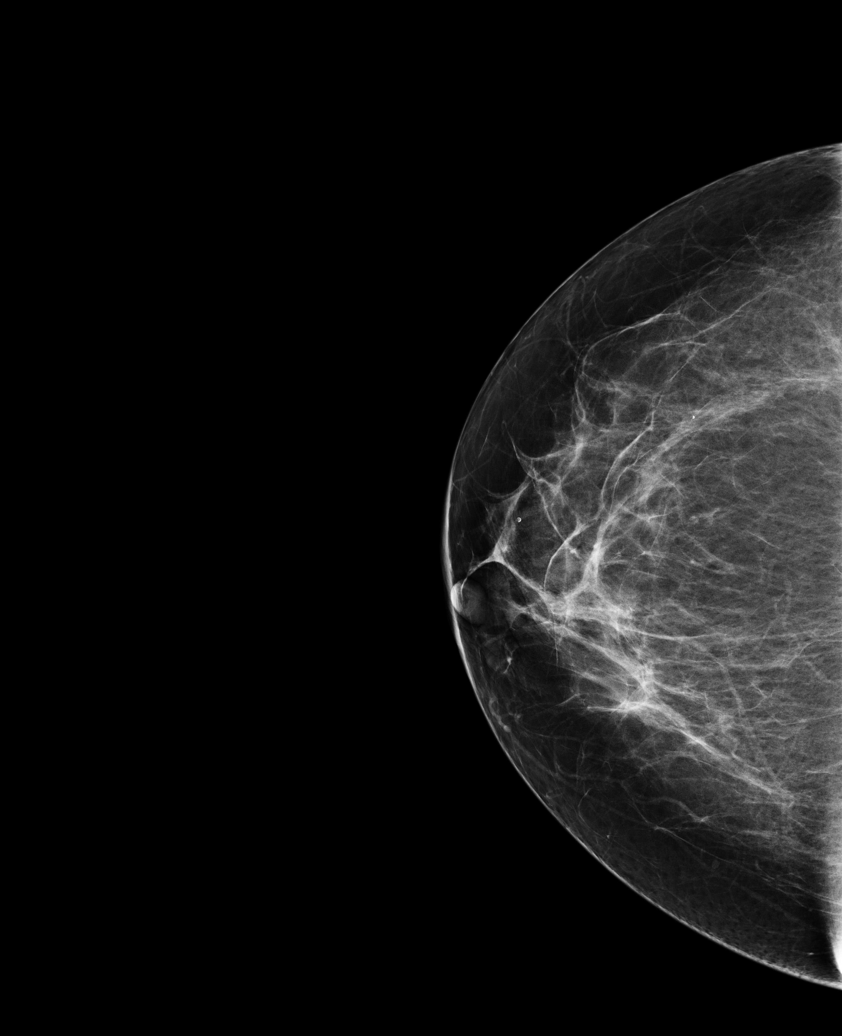

[L CC]
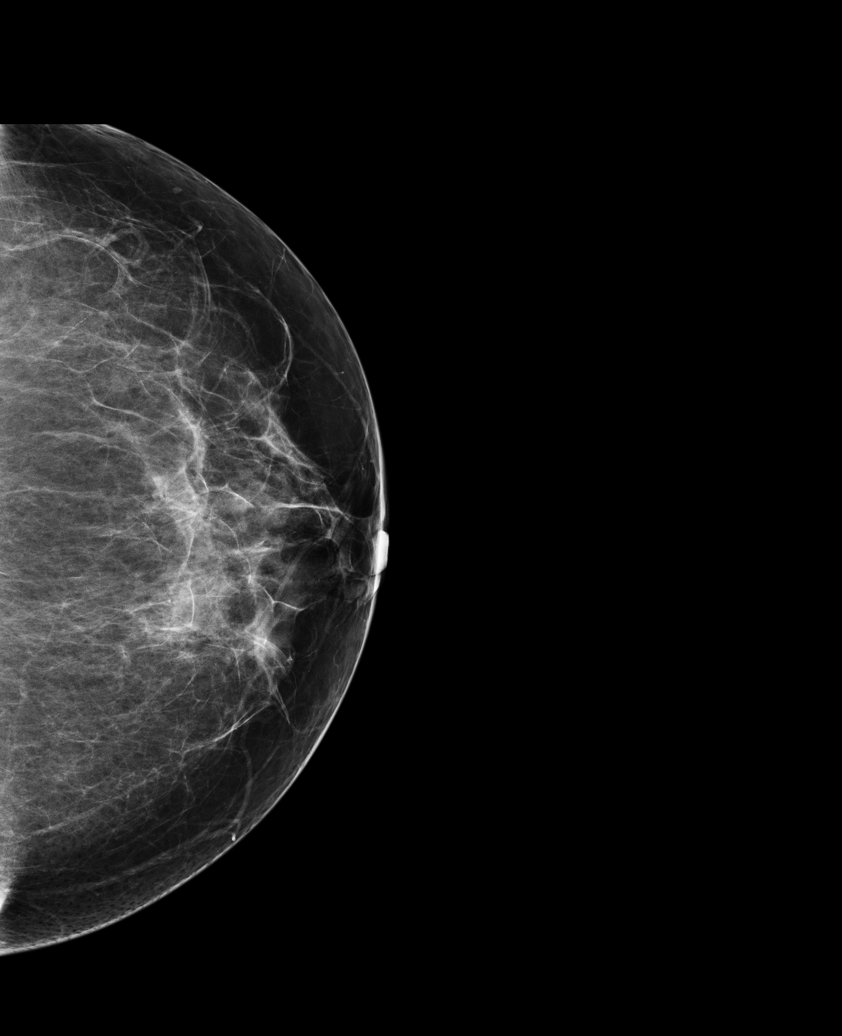

[L MLO]
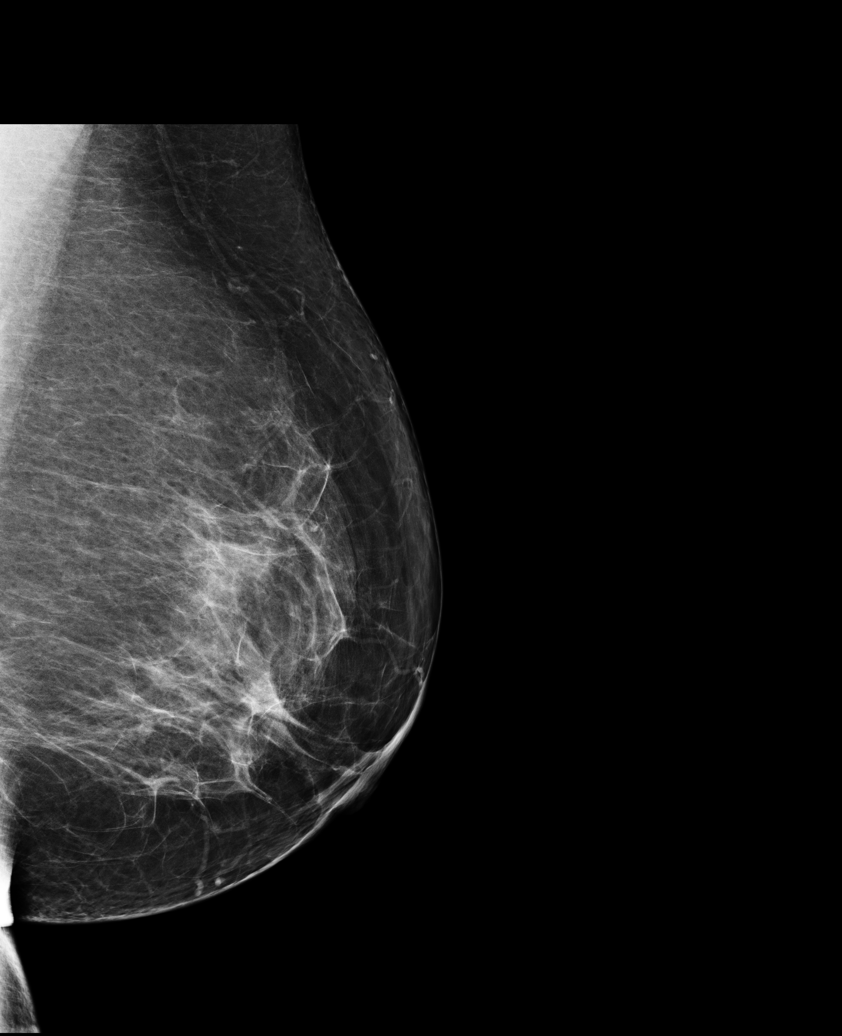

[R MLO (1 of 2)]
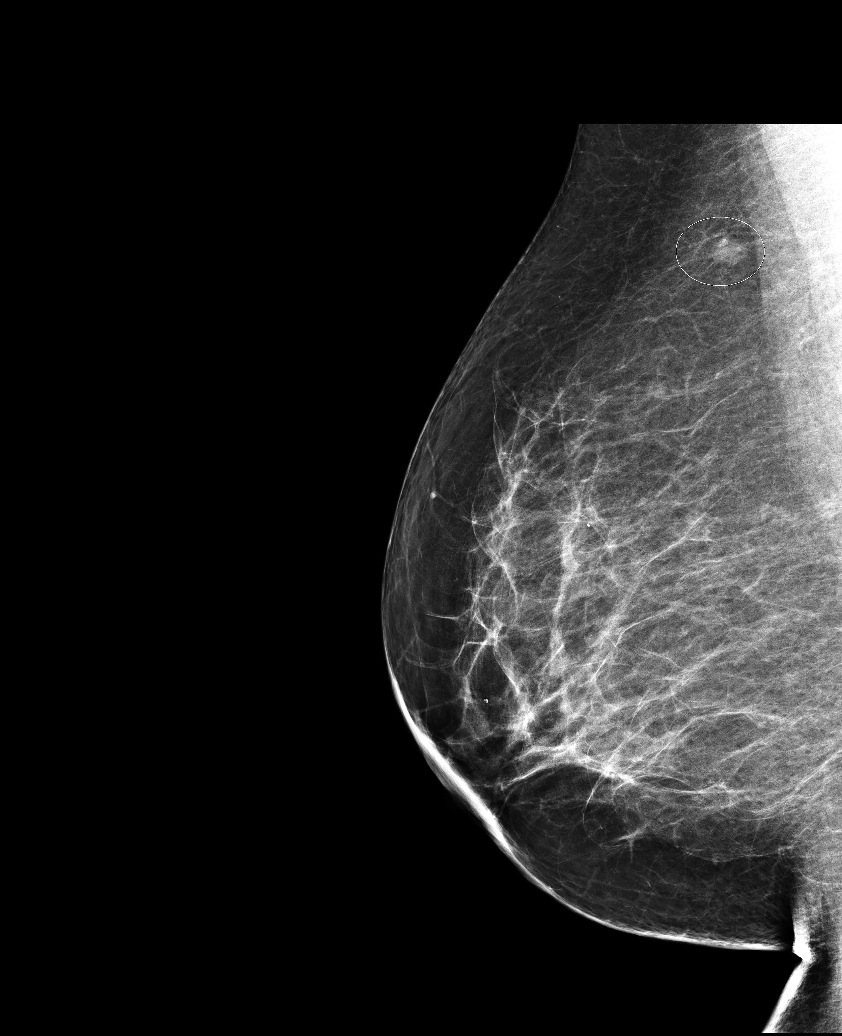

[R MLO (2 of 2)]
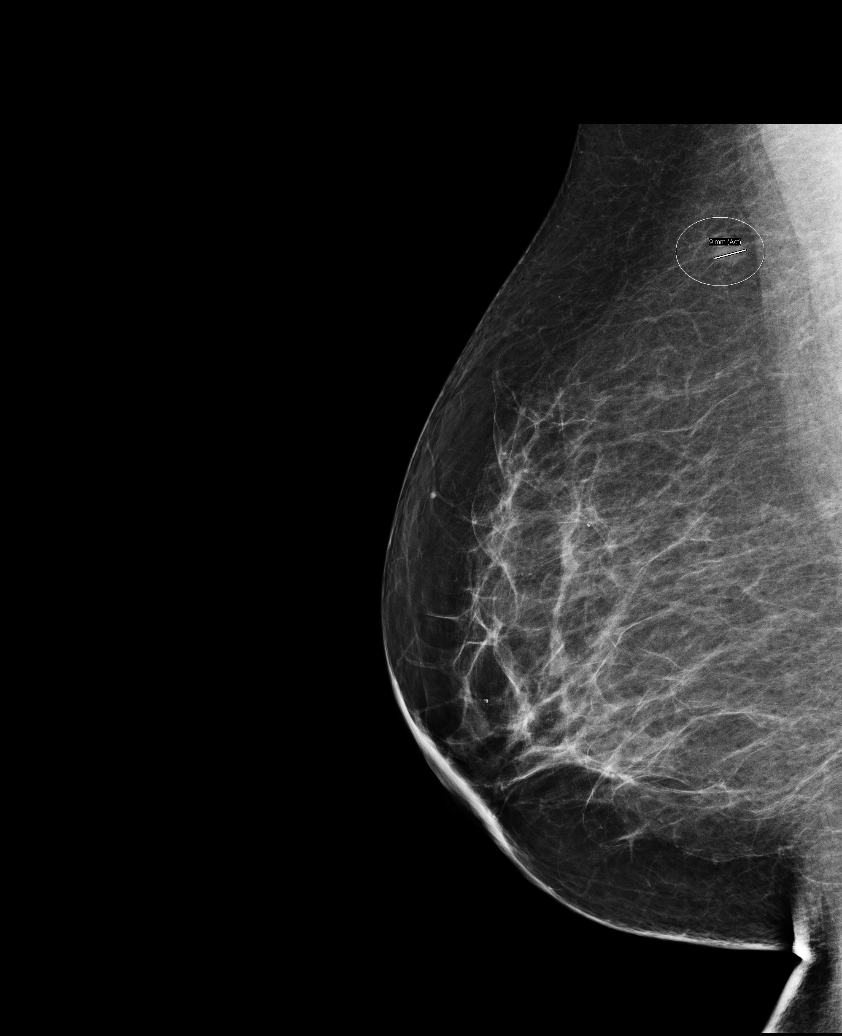

[5 of 5 positions shown; findings below may reference images not displayed]

FINDINGS
MAMMOGRAM, DIAGNOSTIC UNILATERAL, LEFT AND ULTRASOUND BREAST, LEFT:
THERE IS A DENSE FIBROGLANDULAR PATTERN.  NO MASS, SUSPICIOUS TYPE MICROCALCIFICATIONS OR
ARCHITECTURAL DISTORTION IS DETECTED.  COMPARISON IS MADE TO PREVIOUS EXAMS.
PHYSICAL EXAMINATION OF THE LEFT BREAST REVEALS AN AREA OF THICKENING AT 7 O'CLOCK, APPROXIMATELY 8
CM FROM THE NIPPLE, IN THE INFRAMAMMARY RIDGE.  SONOGRAPHIC EVALUATION OF THIS AREA REVEALS AN
ISOECHOIC, WELL-CIRCUMSCRIBED REGION MEASURING .9 X .4 X 1.1 CM.  THIS MAY REPRESENT AN ISLAND OF
NORMAL FIBROGLANDULAR TISSUE OR A LIPOMA.  IT HAS BENIGN CHARACTERISTICS.
IMPRESSION
PROBABLY BENIGN LESION IN THE LEFT BREAST WHICH MAY REPRESENT AN ISLAND OF NORMAL FIBROGLANDULAR
TISSUE OR A LIPOMA.  SHORT TERM INTERVAL FOLLOW-UP IN SIX MONTHS IS RECOMMENDED.  THE PATIENT WAS
INSTRUCTED TO DO MONTHLY BREAST EXAMS AND IF SHE FEELS THIS AREA IS ENLARGING SHE SHOULD RETURN TO
OUR FACILITY FOR FURTHER EVALUATION.
ASSESSMENT:
BIRADS 3: PROBABLY BENIGN FINDING - SHORT INTERVAL FOLLOW-UP IS RECOMMENDED. (LEFT BREAST.)

## 2015-10-03 ENCOUNTER — Other Ambulatory Visit: Payer: Self-pay | Admitting: *Deleted

## 2015-10-03 ENCOUNTER — Other Ambulatory Visit: Payer: Self-pay | Admitting: Obstetrics & Gynecology

## 2015-10-03 ENCOUNTER — Inpatient Hospital Stay
Admission: RE | Admit: 2015-10-03 | Discharge: 2015-10-03 | Disposition: A | Discharge status: Home / Self Care | Payer: Self-pay | Source: Ambulatory Visit | Attending: *Deleted | Admitting: *Deleted

## 2015-10-03 DIAGNOSIS — Z9289 Personal history of other medical treatment: Secondary | ICD-10-CM

## 2015-10-03 DIAGNOSIS — R928 Other abnormal and inconclusive findings on diagnostic imaging of breast: Secondary | ICD-10-CM

## 2015-10-06 ENCOUNTER — Ambulatory Visit
Admission: RE | Admit: 2015-10-06 | Discharge: 2015-10-06 | Disposition: A | Discharge status: Home / Self Care | Payer: Medicare Other | Source: Ambulatory Visit | Attending: Orthopedic Surgery | Admitting: Orthopedic Surgery

## 2015-10-06 DIAGNOSIS — M19012 Primary osteoarthritis, left shoulder: Secondary | ICD-10-CM | POA: Insufficient documentation

## 2015-10-06 DIAGNOSIS — M7582 Other shoulder lesions, left shoulder: Secondary | ICD-10-CM | POA: Insufficient documentation

## 2015-10-11 ENCOUNTER — Ambulatory Visit: Payer: Medicare Other | Admitting: Pain Medicine

## 2015-10-14 ENCOUNTER — Ambulatory Visit
Admission: RE | Admit: 2015-10-14 | Discharge: 2015-10-14 | Disposition: A | Discharge status: Home / Self Care | Payer: Medicare Other | Source: Ambulatory Visit | Attending: Obstetrics & Gynecology | Admitting: Obstetrics & Gynecology

## 2015-10-14 DIAGNOSIS — R928 Other abnormal and inconclusive findings on diagnostic imaging of breast: Secondary | ICD-10-CM

## 2015-10-14 DIAGNOSIS — L723 Sebaceous cyst: Secondary | ICD-10-CM | POA: Insufficient documentation

## 2015-10-14 DIAGNOSIS — N63 Unspecified lump in breast: Secondary | ICD-10-CM | POA: Insufficient documentation

## 2015-10-17 ENCOUNTER — Ambulatory Visit (HOSPITAL_BASED_OUTPATIENT_CLINIC_OR_DEPARTMENT_OTHER): Payer: Medicare Other | Admitting: Pain Medicine

## 2015-10-17 DIAGNOSIS — G8929 Other chronic pain: Secondary | ICD-10-CM

## 2015-11-09 NOTE — Progress Notes (Signed)
Patient ID: Gabriella Robinson, female   DOB: 12-Oct-1960, 55 y.o.   MRN: [id number] No-shows to appointment.

## 2016-01-17 ENCOUNTER — Other Ambulatory Visit: Payer: Self-pay | Admitting: Neurology

## 2016-01-17 DIAGNOSIS — G35 Multiple sclerosis: Secondary | ICD-10-CM

## 2016-02-08 ENCOUNTER — Ambulatory Visit
Admission: RE | Admit: 2016-02-08 | Discharge: 2016-02-08 | Disposition: A | Discharge status: Home / Self Care | Payer: Medicare Other | Source: Ambulatory Visit | Attending: Neurology | Admitting: Neurology

## 2016-02-08 DIAGNOSIS — G35 Multiple sclerosis: Secondary | ICD-10-CM | POA: Insufficient documentation

## 2016-02-08 MED ORDER — GADOBENATE DIMEGLUMINE 529 MG/ML IV SOLN
20.0000 mL | Freq: Once | INTRAVENOUS | Status: AC | PRN
  Administered 2016-02-08: 20 mL via INTRAVENOUS

## 2016-05-23 ENCOUNTER — Other Ambulatory Visit: Payer: Self-pay | Admitting: Orthopedic Surgery

## 2016-05-23 DIAGNOSIS — M47816 Spondylosis without myelopathy or radiculopathy, lumbar region: Secondary | ICD-10-CM

## 2016-06-06 ENCOUNTER — Ambulatory Visit
Admission: RE | Admit: 2016-06-06 | Discharge: 2016-06-06 | Disposition: A | Discharge status: Home / Self Care | Payer: Medicare Other | Source: Ambulatory Visit | Attending: Orthopedic Surgery | Admitting: Orthopedic Surgery

## 2016-06-06 DIAGNOSIS — M5126 Other intervertebral disc displacement, lumbar region: Secondary | ICD-10-CM | POA: Insufficient documentation

## 2016-06-06 DIAGNOSIS — M5416 Radiculopathy, lumbar region: Secondary | ICD-10-CM | POA: Diagnosis present

## 2016-06-06 DIAGNOSIS — M47816 Spondylosis without myelopathy or radiculopathy, lumbar region: Secondary | ICD-10-CM | POA: Insufficient documentation

## 2016-06-06 LAB — POCT I-STAT CREATININE: CREATININE: 0.6 mg/dL (ref 0.44–1.00)

## 2016-06-06 MED ORDER — GADOBENATE DIMEGLUMINE 529 MG/ML IV SOLN
20.0000 mL | Freq: Once | INTRAVENOUS | Status: AC | PRN
  Administered 2016-06-06: 20 mL via INTRAVENOUS

## 2017-01-09 ENCOUNTER — Emergency Department: Payer: Medicare Other

## 2017-01-09 ENCOUNTER — Emergency Department
Admission: EM | Admit: 2017-01-09 | Discharge: 2017-01-09 | Disposition: A | Discharge status: Home / Self Care | Payer: Medicare Other | Attending: Emergency Medicine | Admitting: Emergency Medicine

## 2017-01-09 DIAGNOSIS — R071 Chest pain on breathing: Secondary | ICD-10-CM

## 2017-01-09 DIAGNOSIS — R079 Chest pain, unspecified: Secondary | ICD-10-CM | POA: Diagnosis not present

## 2017-01-09 DIAGNOSIS — E119 Type 2 diabetes mellitus without complications: Secondary | ICD-10-CM | POA: Diagnosis not present

## 2017-01-09 LAB — BASIC METABOLIC PANEL
ANION GAP: 7 (ref 5–15)
BUN: 15 mg/dL (ref 6–20)
CHLORIDE: 109 mmol/L (ref 101–111)
CO2: 24 mmol/L (ref 22–32)
Calcium: 9.6 mg/dL (ref 8.9–10.3)
Creatinine, Ser: 0.61 mg/dL (ref 0.44–1.00)
GFR calc non Af Amer: >60 mL/min (ref >60)
GLUCOSE: 198 mg/dL — AB (ref 65–99)
Potassium: 4.6 mmol/L (ref 3.5–5.1)
Sodium: 140 mmol/L (ref 135–145)

## 2017-01-09 LAB — TROPONIN I: Troponin I: <0.03 ng/mL (ref <0.03)

## 2017-01-09 LAB — CBC
HEMATOCRIT: 38.9 % (ref 35.0–47.0)
HEMOGLOBIN: 13.1 g/dL (ref 12.0–16.0)
MCH: 29.5 pg (ref 26.0–34.0)
MCHC: 33.7 g/dL (ref 32.0–36.0)
MCV: 87.7 fL (ref 80.0–100.0)
Platelets: 324 10*3/uL (ref 150–440)
RBC: 4.44 MIL/uL (ref 3.80–5.20)
RDW: 14.7 % — ABNORMAL HIGH (ref 11.5–14.5)
WBC: 9.8 10*3/uL (ref 3.6–11.0)

## 2017-01-09 MED ORDER — IOPAMIDOL (ISOVUE-370) INJECTION 76%
75.0000 mL | Freq: Once | INTRAVENOUS | Status: AC | PRN
Start: 2017-01-09 — End: 2017-01-09
  Administered 2017-01-09: 75 mL via INTRAVENOUS
  Filled 2017-01-09: qty 75

## 2017-01-09 MED ORDER — OXYCODONE-ACETAMINOPHEN 5-325 MG PO TABS
2.0000 | ORAL_TABLET | Freq: Once | ORAL | Status: AC
  Administered 2017-01-09: 2 via ORAL
  Filled 2017-01-09: qty 2

## 2017-01-09 MED ORDER — ONDANSETRON 4 MG PO TBDP
4.0000 mg | ORAL_TABLET | Freq: Once | ORAL | Status: AC
  Administered 2017-01-09: 4 mg via ORAL
  Filled 2017-01-09: qty 1

## 2017-01-09 MED ORDER — ASPIRIN 81 MG PO TBEC: 81.0000 mg | DELAYED_RELEASE_TABLET | Freq: Every day | ORAL | 12 refills | Status: DC

## 2017-01-09 MED ORDER — ESOMEPRAZOLE MAGNESIUM 40 MG PO CPDR: 40.0000 mg | DELAYED_RELEASE_CAPSULE | Freq: Every day | ORAL | 1 refills | Status: DC

## 2017-01-09 NOTE — ED Triage Notes (Signed)
CP to center of chest since last night. PT thought it was heartburn, took OTC medications without relief. Pain continues to center of chest. Pt alert and oriented X4, active, cooperative, pt in NAD. RR even and unlabored, color WNL.

## 2017-01-09 NOTE — ED Provider Notes (Signed)
[facility] Emergency Department Provider Note       Time seen: ----------------------------------------- 2:34 PM on 01/09/2017 -----------------------------------------     I have reviewed the triage vital signs and the nursing notes.   HISTORY   Chief Complaint Chest Pain    HPI Gabriella Robinson is a 56 y.o. female who presents to the ED for chest pain in the center of her chest since last night. Patient was heartburn, took over-the-counter medications without any relief. Pain continues to be in the center of her chest. She describes similar symptoms in the past when she had a pulmonary embolus. She denies any leg swelling, fevers, chills, cough, vomiting or diarrhea. Nothing makes her symptoms better.   Past Medical History:  Diagnosis Date  . Chronic back pain   . Diabetes ([place])   . HBP (high blood pressure)   . History of blood clots   . Lumbar canal stenosis 11/12/2013  . Migraines   . MS (multiple sclerosis) ([place])   . Sinus complaint   . Sleep apnea     Patient Active Problem List   Diagnosis Date Noted  . Chronic sacroiliac joint pain (Left) 09/20/2015  . Lumbar spondylosis 09/20/2015  . Encounter for therapeutic drug level monitoring 09/19/2015  . Avitaminosis D 09/19/2015  . Lumbar facet syndrome (Location of Secondary source of pain) (Bilateral) (L>R) 09/19/2015  . Neurogenic pain 08/17/2015  . Fibromyalgia 08/17/2015  . Nocturnal muscle cramps (Bilateral lower extremities) 08/17/2015  . Chronic pain 07/21/2015  . Long term current use of opiate analgesic 07/21/2015  . Long term prescription opiate use 07/21/2015  . Opiate use 07/21/2015  . Opioid dependence, daily use ([place]) 07/21/2015  . Uncomplicated opioid dependence ([place]) 07/21/2015  . Adiposity 07/21/2015  . Chronic neck pain (Location of Primary Source of Pain) (Right) 07/21/2015  . Chronic cervical radicular pain (Right) (C7/C8 Dermatome) 07/21/2015  . Chronic low back pain  (Location of Secondary source of pain) (Left) 07/21/2015  . Neuropathic pain 07/21/2015  . Musculoskeletal pain 07/21/2015  . Myofascial pain 07/21/2015  . Diffuse myofascial pain syndrome 07/21/2015  . Restless leg syndrome 07/21/2015  . Carpal tunnel syndrome 06/09/2015  . Migraine without aura and responsive to treatment 04/21/2015  . Multiple sclerosis ([place]) 10/04/2014  . Hand paresthesia 10/04/2014  . Disordered sleep 10/04/2014  . Enthesopathy of hip (Left) 11/12/2013  . Neuropathy 11/12/2013    Past Surgical History:  Procedure Laterality Date  . ABDOMINAL HYSTERECTOMY    . BACK SURGERY    . ECTOPIC PREGNANCY SURGERY    . EYE SURGERY Bilateral   . FOOT SURGERY Bilateral     Allergies Patient has no known allergies.  Social History Social History  Substance Use Topics  . Smoking status: Never Smoker  . Smokeless tobacco: Never Used  . Alcohol use No    Review of Systems Constitutional: Negative for fever. Eyes: Negative for vision changes ENT:  Negative for congestion, sore throat Cardiovascular:Positive for chest pain Respiratory: Negative for shortness of breath. Gastrointestinal: Negative for abdominal pain, vomiting and diarrhea. Genitourinary: Negative for dysuria. Musculoskeletal: Negative for back pain. Skin: Negative for rash. Neurological: Negative for headaches, focal weakness or numbness.  All systems negative/normal/unremarkable except as stated in the HPI  ____________________________________________   PHYSICAL EXAM:  VITAL SIGNS: ED Triage Vitals  Enc Vitals Group     BP 01/09/17 1004 (!) 150/79     Pulse Rate 01/09/17 1004 96     Resp 01/09/17 1004 (!) 22  Temp 01/09/17 1004 98.2 F (36.8 C)     Temp Source 01/09/17 1004 Oral     SpO2 01/09/17 1004 98 %     Weight 01/09/17 1005 260 lb (117.9 kg)     Height 01/09/17 1005 5\' 8"  (1.727 m)     Head Circumference --      Peak Flow --      Pain Score 01/09/17 1004 8     Pain Loc  --      Pain Edu? --      Excl. in [place]? --     Constitutional: Alert and oriented. Well appearing and in no distress. Eyes: Conjunctivae are normal. Normal extraocular movements. ENT   Head: Normocephalic and atraumatic.   Nose: No congestion/rhinnorhea.   Mouth/Throat: Mucous membranes are moist.   Neck: No stridor. Cardiovascular: Normal rate, regular rhythm. No murmurs, rubs, or gallops. Respiratory: Normal respiratory effort without tachypnea nor retractions. Breath sounds are clear and equal bilaterally. No wheezes/rales/rhonchi. Gastrointestinal: Soft and nontender. Normal bowel sounds Musculoskeletal: Nontender with normal range of motion in extremities. No lower extremity tenderness nor edema. Neurologic:  Normal speech and language. No gross focal neurologic deficits are appreciated.  Skin:  Skin is warm, dry and intact. No rash noted. Psychiatric: Mood and affect are normal. Speech and behavior are normal.  ____________________________________________  EKG: Interpreted by me. Sinus rhythm rate of 97 bpm, normal PR interval, normal QRS, normal QT.  ____________________________________________  ED COURSE:  Pertinent labs & imaging results that were available during my care of the patient were reviewed by me and considered in my medical decision making (see chart for details). Patient presents for chest pain, we will assess with labs and imaging as indicated.   Procedures ____________________________________________   LABS (pertinent positives/negatives)  Labs Reviewed  BASIC METABOLIC PANEL - Abnormal; Notable for the following:       Result Value   Glucose, Bld 198 (*)    All other components within normal limits  CBC - Abnormal; Notable for the following:    RDW 14.7 (*)    All other components within normal limits  TROPONIN I    RADIOLOGY Images were viewed by me  Chest x-ray was unremarkable CT Angiogram of the chest IMPRESSION: 1. No  evidence of pulmonary embolism. 2. Mild LAD and right coronary artery atherosclerosis. 3. Mild atherosclerosis involving the thoracic aorta. 4. Minimal atelectasis involving the left lower lobe. No acute cardiopulmonary disease otherwise. 5. Steatosis involving the visualized portions of the liver. 6. Multinodular goiter. ____________________________________________  FINAL ASSESSMENT AND PLAN  Chest pain  Plan: Patient's labs and imaging were dictated above. Patient had presented for chest pain of uncertain etiology. CT was negative for PE, she'll be referred to cardiology for outpatient follow-up.   Earleen Newport, MD   Note: This note was generated in part or whole with voice recognition software. Voice recognition is usually quite accurate but there are transcription errors that can and very often do occur. I apologize for any typographical errors that were not detected and corrected.     Earleen Newport, MD 01/09/17 1535

## 2017-01-09 NOTE — ED Notes (Signed)
ED Provider at bedside. 

## 2017-01-24 ENCOUNTER — Ambulatory Visit: Payer: Medicare Other | Admitting: Obstetrics & Gynecology

## 2017-05-14 ENCOUNTER — Other Ambulatory Visit: Payer: Self-pay | Admitting: Student

## 2017-05-14 DIAGNOSIS — R1013 Epigastric pain: Secondary | ICD-10-CM

## 2017-05-14 DIAGNOSIS — K219 Gastro-esophageal reflux disease without esophagitis: Secondary | ICD-10-CM

## 2017-05-17 ENCOUNTER — Ambulatory Visit
Admission: RE | Admit: 2017-05-17 | Discharge: 2017-05-17 | Disposition: A | Discharge status: Home / Self Care | Payer: Medicare Other | Source: Ambulatory Visit | Attending: Student | Admitting: Student

## 2017-05-17 DIAGNOSIS — K219 Gastro-esophageal reflux disease without esophagitis: Secondary | ICD-10-CM | POA: Diagnosis not present

## 2017-05-17 DIAGNOSIS — R1013 Epigastric pain: Secondary | ICD-10-CM

## 2017-09-21 ENCOUNTER — Encounter: Payer: Self-pay | Admitting: Gynecology

## 2017-09-21 ENCOUNTER — Other Ambulatory Visit: Payer: Self-pay

## 2017-09-21 ENCOUNTER — Ambulatory Visit
Admission: EM | Admit: 2017-09-21 | Discharge: 2017-09-21 | Disposition: A | Discharge status: Home / Self Care | Payer: Medicare Other | Attending: Emergency Medicine | Admitting: Emergency Medicine

## 2017-09-21 DIAGNOSIS — Z86718 Personal history of other venous thrombosis and embolism: Secondary | ICD-10-CM | POA: Insufficient documentation

## 2017-09-21 DIAGNOSIS — Z7982 Long term (current) use of aspirin: Secondary | ICD-10-CM | POA: Diagnosis not present

## 2017-09-21 DIAGNOSIS — M797 Fibromyalgia: Secondary | ICD-10-CM | POA: Insufficient documentation

## 2017-09-21 DIAGNOSIS — G35 Multiple sclerosis: Secondary | ICD-10-CM | POA: Insufficient documentation

## 2017-09-21 DIAGNOSIS — N390 Urinary tract infection, site not specified: Secondary | ICD-10-CM

## 2017-09-21 DIAGNOSIS — R350 Frequency of micturition: Secondary | ICD-10-CM | POA: Diagnosis not present

## 2017-09-21 DIAGNOSIS — Z90710 Acquired absence of both cervix and uterus: Secondary | ICD-10-CM | POA: Diagnosis not present

## 2017-09-21 DIAGNOSIS — E119 Type 2 diabetes mellitus without complications: Secondary | ICD-10-CM | POA: Insufficient documentation

## 2017-09-21 DIAGNOSIS — Z87440 Personal history of urinary (tract) infections: Secondary | ICD-10-CM | POA: Insufficient documentation

## 2017-09-21 DIAGNOSIS — R3915 Urgency of urination: Secondary | ICD-10-CM | POA: Insufficient documentation

## 2017-09-21 DIAGNOSIS — G4730 Sleep apnea, unspecified: Secondary | ICD-10-CM | POA: Insufficient documentation

## 2017-09-21 DIAGNOSIS — Z7984 Long term (current) use of oral hypoglycemic drugs: Secondary | ICD-10-CM | POA: Diagnosis not present

## 2017-09-21 DIAGNOSIS — G8929 Other chronic pain: Secondary | ICD-10-CM | POA: Insufficient documentation

## 2017-09-21 DIAGNOSIS — R300 Dysuria: Secondary | ICD-10-CM

## 2017-09-21 DIAGNOSIS — Z79899 Other long term (current) drug therapy: Secondary | ICD-10-CM | POA: Diagnosis not present

## 2017-09-21 LAB — URINALYSIS, COMPLETE (UACMP) WITH MICROSCOPIC

## 2017-09-21 MED ORDER — NITROFURANTOIN MONOHYD MACRO 100 MG PO CAPS: 100.0000 mg | ORAL_CAPSULE | Freq: Two times a day (BID) | ORAL | 0 refills | Status: DC

## 2017-09-21 MED ORDER — IBUPROFEN 600 MG PO TABS: 600.0000 mg | ORAL_TABLET | Freq: Four times a day (QID) | ORAL | 0 refills | Status: DC | PRN

## 2017-09-21 MED ORDER — PHENAZOPYRIDINE HCL 200 MG PO TABS: 200.0000 mg | ORAL_TABLET | Freq: Three times a day (TID) | ORAL | 0 refills | Status: DC | PRN

## 2017-09-21 NOTE — ED Provider Notes (Signed)
HPI  SUBJECTIVE:  Gabriella Robinson is a 57 y.o. female who presents with 7-10 days of dysuria, urgency, frequency, cloudy urine, states that she is urinating small amounts at a time.  She reports low abdominal pressure after urinating.  She tried pushing fluids, Azo, Tylenol, ibuprofen without improvement of symptoms.  Symptoms worse with urinating.  She denies odorous urine, hematuria.  No fevers, other abdominal, back, pelvic pain.  No vaginal bleeding, odor, rash, discharge, itching.  She has not been sexually active in 5 or 6 years.  No antibiotics in the past month.  She took Tylenol within 6-8 hours of evaluation.  She states this is similar to previous UTIs.  She has a past medical history of diabetes, but states that her glucose has been running within normal limits for her, also MS, chronic pain, fibromyalgia, UTIs.  No history of pyelonephritis, nephrolithiasis, gonorrhea, chlamydia, HIV, HSV, trichomonas, syphilis, BV, yeast.  [id number], Clayborne Artist, MD   Past Medical History:  Diagnosis Date  . Chronic back pain   . Diabetes ([place])   . HBP (high blood pressure)   . History of blood clots   . Lumbar canal stenosis 11/12/2013  . Migraines   . MS (multiple sclerosis) ([place])   . Sinus complaint   . Sleep apnea     Past Surgical History:  Procedure Laterality Date  . ABDOMINAL HYSTERECTOMY    . BACK SURGERY    . ECTOPIC PREGNANCY SURGERY    . EYE SURGERY Bilateral   . FOOT SURGERY Bilateral     Family History  Problem Relation Age of Onset  . Breast cancer Neg Hx     Social History   Tobacco Use  . Smoking status: Never Smoker  . Smokeless tobacco: Never Used  Substance Use Topics  . Alcohol use: No  . Drug use: Not on file     Current Facility-Administered Medications:  .  lidocaine (PF) (XYLOCAINE) 1 % injection 10 mL, 10 mL, Other, Once, Milinda Pointer, MD  Current Outpatient Medications:  .  acetaminophen (TYLENOL) 500 MG tablet, Take 1,000 mg by mouth every 4  (four) hours as needed for pain., Disp: , Rfl:  .  aspirin 81 MG EC tablet, Take 1 tablet (81 mg total) by mouth daily. Swallow whole., Disp: 30 tablet, Rfl: 12 .  Cholecalciferol (VITAMIN D3) 5000 UNITS TABS, Take by mouth., Disp: , Rfl:  .  glipiZIDE (GLUCOTROL XL) 5 MG 24 hr tablet, TK 1 T PO QD, Disp: , Rfl: 3 .  interferon beta-1a (REBIF) 22 MCG/0.5ML injection, Inject 22 mcg into the skin 3 (three) times a week. Monday Wednesday and Friday, Disp: , Rfl:  .  lovastatin (MEVACOR) 10 MG tablet, Take 10 mg by mouth at bedtime., Disp: , Rfl:  .  ONETOUCH VERIO test strip, TEST BLOOD SUGAR TID, Disp: , Rfl: 5 .  rOPINIRole (REQUIP) 1 MG tablet, hs prn, Disp: , Rfl: 5 .  sitaGLIPtin-metformin (JANUMET) 50-500 MG tablet, Take 1 tablet by mouth 2 (two) times daily with a meal. , Disp: , Rfl:  .  topiramate (TOPAMAX) 50 MG tablet, Take 50 mg by mouth 2 (two) times daily. , Disp: , Rfl:  .  traZODone (DESYREL) 50 MG tablet, TAKE 1 TO 2 TABLETS BY MOUTH EVERY NIGHT FOR SLEEP, Disp: , Rfl:  .  Biotin w/ Vitamins C & E (HAIR SKIN & NAILS GUMMIES PO), Take 1 tablet by mouth daily., Disp: , Rfl:  .  cetirizine (ZYRTEC) 10 MG  tablet, Reported on 09/20/2015, Disp: , Rfl: 2 .  esomeprazole (NEXIUM) 40 MG capsule, Take 40 mg by mouth daily before breakfast., Disp: , Rfl:  .  esomeprazole (NEXIUM) 40 MG capsule, Take 1 capsule (40 mg total) by mouth daily., Disp: 30 capsule, Rfl: 1 .  gabapentin (NEURONTIN) 800 MG tablet, Take 1 tablet (800 mg total) by mouth 4 (four) times daily., Disp: 120 tablet, Rfl: 0 .  hydrochlorothiazide (MICROZIDE) 12.5 MG capsule, Take 1 capsule (12.5 mg total) by mouth every morning., Disp: 30 capsule, Rfl: 0 .  lisinopril (PRINIVIL,ZESTRIL) 10 MG tablet, Take 10 mg by mouth daily., Disp: , Rfl:  .  meloxicam (MOBIC) 15 MG tablet, Take 1 tablet (15 mg total) by mouth daily., Disp: 90 tablet, Rfl: 3 .  oxybutynin (OXYTROL) 3.9 MG/24HR, as needed., Disp: , Rfl:  .  Oxycodone HCl 10 MG  TABS, Take 1 tablet (10 mg total) by mouth every 6 (six) hours as needed., Disp: 120 tablet, Rfl: 0 .  tiZANidine (ZANAFLEX) 4 MG tablet, Take 4 mg by mouth as needed. , Disp: , Rfl:   No Known Allergies   ROS  As noted in HPI.   Physical Exam  BP 126/88 (BP Location: Left Arm)   Pulse 100   Temp 98.4 F (36.9 C) (Oral)   Resp 16   Ht 5\' 8"  (1.727 m)   Wt 249 lb (112.9 kg)   SpO2 98%   BMI 37.86 kg/m   Constitutional: Well developed, well nourished, appears uncomfortable Eyes:  EOMI, conjunctiva normal bilaterally HENT: Normocephalic, atraumatic,mucus membranes moist Respiratory: Normal inspiratory effort Cardiovascular: Normal rate GI: nondistended soft.  No suprapubic, flank tenderness  Back: No CVAT  GU: Deferred  skin: No rash, skin intact Musculoskeletal: no deformities Neurologic: Alert & oriented x 3, no focal neuro deficits Psychiatric: Speech and behavior appropriate   ED Course   Medications - No data to display  Orders Placed This Encounter  Procedures  . Urine culture    Standing Status:   Standing    Number of Occurrences:   1    Order Specific Question:   List patient's active antibiotics    Answer:   macrobid    Order Specific Question:   Patient immune status    Answer:   Normal  . Urinalysis, Complete w Microscopic    Standing Status:   Standing    Number of Occurrences:   1    Results for orders placed or performed during the hospital encounter of 09/21/17 (from the past 24 hour(s))  Urinalysis, Complete w Microscopic     Status: Abnormal   Collection Time: 09/21/17  8:22 AM  Result Value Ref Range   Color, Urine ORANGE (A) YELLOW   APPearance CLOUDY (A) CLEAR   Specific Gravity, Urine  1.005 - 1.030    TEST NOT REPORTED DUE TO COLOR INTERFERENCE OF URINE PIGMENT   pH  5.0 - 8.0    TEST NOT REPORTED DUE TO COLOR INTERFERENCE OF URINE PIGMENT   Glucose, UA (A) NEGATIVE mg/dL    TEST NOT REPORTED DUE TO COLOR INTERFERENCE OF URINE  PIGMENT   Hgb urine dipstick (A) NEGATIVE    TEST NOT REPORTED DUE TO COLOR INTERFERENCE OF URINE PIGMENT   Bilirubin Urine (A) NEGATIVE    TEST NOT REPORTED DUE TO COLOR INTERFERENCE OF URINE PIGMENT   Ketones, ur (A) NEGATIVE mg/dL    TEST NOT REPORTED DUE TO COLOR INTERFERENCE OF URINE PIGMENT   Protein,  ur (A) NEGATIVE mg/dL    TEST NOT REPORTED DUE TO COLOR INTERFERENCE OF URINE PIGMENT   Nitrite (A) NEGATIVE    TEST NOT REPORTED DUE TO COLOR INTERFERENCE OF URINE PIGMENT   Leukocytes, UA (A) NEGATIVE    TEST NOT REPORTED DUE TO COLOR INTERFERENCE OF URINE PIGMENT   Squamous Epithelial / LPF 0-5 (A) NONE SEEN   WBC, UA TOO NUMEROUS TO COUNT 0 - 5 WBC/hpf   RBC / HPF 0-5 0 - 5 RBC/hpf   Bacteria, UA MANY (A) NONE SEEN   Mucus PRESENT    Amorphous Crystal PRESENT    No results found.  ED Clinical Impression  Dysuria   ED Assessment/Plan  Reviewed labs.  No previous urine cultures available.  Patient took Azo prior to evaluation, so the urinalysis will be limited.  However her presentation is consistent with a UTI so we will send home with Pyridium, Macrobid, ibuprofen 600 mg / 1 g of Tylenol 3-4 times a day.  Advised to push fluids.  We will send off urine for culture to confirm antibiotic choice.  Follow Up with PMD as needed, to the ER if she gets worse.Marland Kitchen  UA has pyuria, many bacteria.  No hematuria.  Discussed labs, MDM, plan and followup with patient. Discussed sn/sx that should prompt return to the ED. patient agrees with plan.   No orders of the defined types were placed in this encounter.   *This clinic note was created using Dragon dictation software. Therefore, there may be occasional mistakes despite careful proofreading.   ?   Melynda Ripple, MD 09/21/17 [phone]

## 2017-09-21 NOTE — Discharge Instructions (Signed)
600 mg ibuprofen with 1 g of Tylenol 3-4 times a day as needed for pain.  Continue pushing fluids.  Finish the antibiotics unless your health care provider tells you to stop.

## 2017-09-21 NOTE — ED Triage Notes (Signed)
Patient c/o frequent urination/ burning urination over a week.

## 2017-09-24 LAB — URINE CULTURE
Culture: >=100000 — AB
Special Requests: NORMAL

## 2017-10-29 ENCOUNTER — Other Ambulatory Visit: Payer: Self-pay | Admitting: Obstetrics and Gynecology

## 2017-10-29 DIAGNOSIS — Z1231 Encounter for screening mammogram for malignant neoplasm of breast: Secondary | ICD-10-CM

## 2017-11-15 ENCOUNTER — Ambulatory Visit
Admission: RE | Admit: 2017-11-15 | Discharge: 2017-11-15 | Disposition: A | Discharge status: Home / Self Care | Payer: Medicare Other | Source: Ambulatory Visit | Attending: Obstetrics and Gynecology | Admitting: Obstetrics and Gynecology

## 2017-11-15 DIAGNOSIS — Z1231 Encounter for screening mammogram for malignant neoplasm of breast: Secondary | ICD-10-CM | POA: Insufficient documentation

## 2018-06-21 ENCOUNTER — Ambulatory Visit
Admission: EM | Admit: 2018-06-21 | Discharge: 2018-06-21 | Disposition: A | Discharge status: Home / Self Care | Payer: Medicare Other | Attending: Family Medicine | Admitting: Family Medicine

## 2018-06-21 DIAGNOSIS — G35 Multiple sclerosis: Secondary | ICD-10-CM | POA: Insufficient documentation

## 2018-06-21 DIAGNOSIS — Z9889 Other specified postprocedural states: Secondary | ICD-10-CM | POA: Insufficient documentation

## 2018-06-21 DIAGNOSIS — N3001 Acute cystitis with hematuria: Secondary | ICD-10-CM | POA: Diagnosis not present

## 2018-06-21 DIAGNOSIS — M549 Dorsalgia, unspecified: Secondary | ICD-10-CM | POA: Diagnosis not present

## 2018-06-21 DIAGNOSIS — G8929 Other chronic pain: Secondary | ICD-10-CM | POA: Insufficient documentation

## 2018-06-21 DIAGNOSIS — Z833 Family history of diabetes mellitus: Secondary | ICD-10-CM | POA: Diagnosis not present

## 2018-06-21 DIAGNOSIS — G4730 Sleep apnea, unspecified: Secondary | ICD-10-CM | POA: Diagnosis not present

## 2018-06-21 DIAGNOSIS — Z79891 Long term (current) use of opiate analgesic: Secondary | ICD-10-CM | POA: Insufficient documentation

## 2018-06-21 DIAGNOSIS — E119 Type 2 diabetes mellitus without complications: Secondary | ICD-10-CM | POA: Insufficient documentation

## 2018-06-21 DIAGNOSIS — M542 Cervicalgia: Secondary | ICD-10-CM | POA: Insufficient documentation

## 2018-06-21 DIAGNOSIS — Z7984 Long term (current) use of oral hypoglycemic drugs: Secondary | ICD-10-CM | POA: Insufficient documentation

## 2018-06-21 DIAGNOSIS — Z79899 Other long term (current) drug therapy: Secondary | ICD-10-CM | POA: Insufficient documentation

## 2018-06-21 DIAGNOSIS — Z7982 Long term (current) use of aspirin: Secondary | ICD-10-CM | POA: Diagnosis not present

## 2018-06-21 LAB — URINALYSIS, COMPLETE (UACMP) WITH MICROSCOPIC
Bilirubin Urine: NEGATIVE
GLUCOSE, UA: 100 mg/dL — AB
KETONES UR: NEGATIVE mg/dL
NITRITE: POSITIVE — AB
PH: 7 (ref 5.0–8.0)
Protein, ur: NEGATIVE mg/dL
RBC / HPF: >50 RBC/hpf (ref 0–5)
Specific Gravity, Urine: 1.02 (ref 1.005–1.030)
WBC, UA: >50 WBC/hpf (ref 0–5)

## 2018-06-21 MED ORDER — CEPHALEXIN 500 MG PO CAPS: 500.0000 mg | ORAL_CAPSULE | Freq: Two times a day (BID) | ORAL | 0 refills | Status: DC

## 2018-06-21 NOTE — ED Provider Notes (Signed)
[facility]    CSN: [id number] Arrival date & time: 06/21/18  1314  History   Chief Complaint Chief Complaint  Patient presents with  . Urinary Tract Infection   HPI  57 year old female presents with concerns for UTI.  1 week history of frequency, urgency, dysuria. No fever.  No chills.  Mild pain.  She reports that she has been using Azo with improvement but no resolution.  No flank pain.  No exacerbating factors.  No other associated symptoms.  No other complaints.   Past Medical History:  Diagnosis Date  . Chronic back pain   . Diabetes ([place])   . HBP (high blood pressure)   . History of blood clots   . Lumbar canal stenosis 11/12/2013  . Migraines   . MS (multiple sclerosis) ([place])   . Sinus complaint   . Sleep apnea    Patient Active Problem List   Diagnosis Date Noted  . Chronic sacroiliac joint pain (Left) 09/20/2015  . Lumbar spondylosis 09/20/2015  . Encounter for therapeutic drug level monitoring 09/19/2015  . Avitaminosis D 09/19/2015  . Lumbar facet syndrome (Location of Secondary source of pain) (Bilateral) (L>R) 09/19/2015  . Neurogenic pain 08/17/2015  . Fibromyalgia 08/17/2015  . Nocturnal muscle cramps (Bilateral lower extremities) 08/17/2015  . Chronic pain 07/21/2015  . Long term current use of opiate analgesic 07/21/2015  . Long term prescription opiate use 07/21/2015  . Opiate use 07/21/2015  . Opioid dependence, daily use ([place]) 07/21/2015  . Uncomplicated opioid dependence ([place]) 07/21/2015  . Adiposity 07/21/2015  . Chronic neck pain (Location of Primary Source of Pain) (Right) 07/21/2015  . Chronic cervical radicular pain (Right) (C7/C8 Dermatome) 07/21/2015  . Chronic low back pain (Location of Secondary source of pain) (Left) 07/21/2015  . Neuropathic pain 07/21/2015  . Musculoskeletal pain 07/21/2015  . Myofascial pain 07/21/2015  . Diffuse myofascial pain syndrome 07/21/2015  . Restless leg syndrome 07/21/2015  . Carpal tunnel  syndrome 06/09/2015  . Migraine without aura and responsive to treatment 04/21/2015  . Multiple sclerosis ([place]) 10/04/2014  . Hand paresthesia 10/04/2014  . Disordered sleep 10/04/2014  . Enthesopathy of hip (Left) 11/12/2013  . Neuropathy 11/12/2013    Past Surgical History:  Procedure Laterality Date  . ABDOMINAL HYSTERECTOMY    . BACK SURGERY    . ECTOPIC PREGNANCY SURGERY    . EYE SURGERY Bilateral   . FOOT SURGERY Bilateral   . OOPHORECTOMY Left    pt has right ovary still    OB History    Gravida  4   Para      Term      Preterm      AB  2   Living  2     SAB  1   TAB      Ectopic  1   Multiple      Live Births             Home Medications    Prior to Admission medications   Medication Sig Start Date End Date Taking? Authorizing Provider  acetaminophen (TYLENOL) 500 MG tablet Take 1,000 mg by mouth every 4 (four) hours as needed for pain.   Yes [provider]  aspirin 81 MG EC tablet Take 1 tablet (81 mg total) by mouth daily. Swallow whole. 01/09/17  Yes Earleen Newport, MD  Cholecalciferol (VITAMIN D3) 5000 UNITS TABS Take by mouth.   Yes [provider]  glipiZIDE (GLUCOTROL XL) 5 MG 24  hr tablet TK 1 T PO QD 07/16/15  Yes [provider]  interferon beta-1a (REBIF) 22 MCG/0.5ML injection Inject 22 mcg into the skin 3 (three) times a week. Monday Wednesday and Friday   Yes [provider]  oxybutynin (OXYTROL) 3.9 MG/24HR as needed. 06/03/14  Yes [provider]  sitaGLIPtin-metformin (JANUMET) 50-500 MG tablet Take 1 tablet by mouth 2 (two) times daily with a meal.  08/10/14  Yes [provider]  topiramate (TOPAMAX) 50 MG tablet Take 50 mg by mouth 2 (two) times daily.  06/14/15  Yes [provider]  traZODone (DESYREL) 50 MG tablet TAKE 1 TO 2 TABLETS BY MOUTH EVERY NIGHT FOR SLEEP 07/28/15  Yes [provider]  cephALEXin (KEFLEX) 500 MG capsule Take 1 capsule (500 mg  total) by mouth 2 (two) times daily. 06/21/18   Coral Spikes, DO  esomeprazole (NEXIUM) 40 MG capsule Take 1 capsule (40 mg total) by mouth daily. 01/09/17 01/09/18  Earleen Newport, MD  ibuprofen (ADVIL,MOTRIN) 600 MG tablet Take 1 tablet (600 mg total) by mouth every 6 (six) hours as needed. 09/21/17   Melynda Ripple, MD  lovastatin (MEVACOR) 10 MG tablet Take 10 mg by mouth at bedtime.    [provider]  nitrofurantoin, macrocrystal-monohydrate, (MACROBID) 100 MG capsule Take 1 capsule (100 mg total) by mouth 2 (two) times daily. X 5 days 09/21/17   Melynda Ripple, MD  phenazopyridine (PYRIDIUM) 200 MG tablet Take 1 tablet (200 mg total) by mouth 3 (three) times daily as needed for pain. 09/21/17   Melynda Ripple, MD  rOPINIRole (REQUIP) 1 MG tablet hs prn 09/08/15   [provider]   Family History Family History  Problem Relation Age of Onset  . Diabetes Mother   . Breast cancer Neg Hx    Social History Social History   Tobacco Use  . Smoking status: Never Smoker  . Smokeless tobacco: Never Used  Substance Use Topics  . Alcohol use: No  . Drug use: Not on file   Allergies   Patient has no known allergies.  Review of Systems Review of Systems  Constitutional: Negative.   Gastrointestinal: Negative.   Genitourinary: Positive for dysuria, frequency and urgency.   Physical Exam Triage Vital Signs ED Triage Vitals  Enc Vitals Group     BP 06/21/18 1327 134/85     Pulse Rate 06/21/18 1327 94     Resp 06/21/18 1327 18     Temp 06/21/18 1327 97.7 F (36.5 C)     Temp Source 06/21/18 1327 Oral     SpO2 06/21/18 1327 97 %     Weight 06/21/18 1329 247 lb (112 kg)     Height --      Head Circumference --      Peak Flow --      Pain Score 06/21/18 1329 8     Pain Loc --      Pain Edu? --      Excl. in [place]? --    Updated Vital Signs BP 134/85 (BP Location: Right Arm)   Pulse 94   Temp 97.7 F (36.5 C) (Oral)   Resp 18   Wt 112 kg   SpO2 97%    BMI 37.56 kg/m   Visual Acuity Right Eye Distance:   Left Eye Distance:   Bilateral Distance:    Right Eye Near:   Left Eye Near:    Bilateral Near:     Physical Exam  Constitutional:  She is oriented to person, place, and time. She appears well-developed. No distress.  HENT:  Head: Normocephalic and atraumatic.  Cardiovascular: Normal rate and regular rhythm.  Pulmonary/Chest: Effort normal and breath sounds normal. She has no wheezes. She has no rales.  Abdominal: Soft.  Mild suprapubic tenderness.  Neurological: She is alert and oriented to person, place, and time.  Psychiatric: She has a normal mood and affect. Her behavior is normal.  Nursing note and vitals reviewed.  UC Treatments / Results  Labs (all labs ordered are listed, but only abnormal results are displayed) Labs Reviewed  URINALYSIS, COMPLETE (UACMP) WITH MICROSCOPIC - Abnormal; Notable for the following components:      Result Value   Color, Urine ORANGE (*)    APPearance CLOUDY (*)    Glucose, UA 100 (*)    Hgb urine dipstick MODERATE (*)    Nitrite POSITIVE (*)    Leukocytes, UA LARGE (*)    Bacteria, UA MANY (*)    All other components within normal limits    EKG None  Radiology No results found.  Procedures Procedures (including critical care time)  Medications Ordered in UC Medications - No data to display  Initial Impression / Assessment and Plan / UC Course  I have reviewed the triage vital signs and the nursing notes.  Pertinent labs & imaging results that were available during my care of the patient were reviewed by me and considered in my medical decision making (see chart for details).    57 year old female presents with UTI.  Treating with Keflex.  Sending culture.  Final Clinical Impressions(s) / UC Diagnoses   Final diagnoses:  Acute cystitis with hematuria   Discharge Instructions   None    ED Prescriptions    Medication Sig Dispense Auth. Provider   cephALEXin  (KEFLEX) 500 MG capsule Take 1 capsule (500 mg total) by mouth 2 (two) times daily. 14 capsule Coral Spikes, DO     Controlled Substance Prescriptions [place] Controlled Substance Registry consulted? Not Applicable   Coral Spikes, DO 06/21/18 1430

## 2018-06-21 NOTE — ED Triage Notes (Signed)
Pt here for UTI. Has been taking azo for the past week. Having urgency, pelvic pressure and pain along with dysuria and frequency

## 2019-10-01 ENCOUNTER — Ambulatory Visit
Admission: EM | Admit: 2019-10-01 | Discharge: 2019-10-01 | Disposition: A | Discharge status: Home / Self Care | Payer: Medicare Other | Attending: Family Medicine | Admitting: Family Medicine

## 2019-10-01 ENCOUNTER — Other Ambulatory Visit: Payer: Self-pay

## 2019-10-01 ENCOUNTER — Encounter: Payer: Self-pay | Admitting: Emergency Medicine

## 2019-10-01 DIAGNOSIS — R102 Pelvic and perineal pain: Secondary | ICD-10-CM | POA: Insufficient documentation

## 2019-10-01 DIAGNOSIS — R300 Dysuria: Secondary | ICD-10-CM | POA: Insufficient documentation

## 2019-10-01 LAB — URINALYSIS, COMPLETE (UACMP) WITH MICROSCOPIC
Bacteria, UA: NONE SEEN
Bilirubin Urine: NEGATIVE
Glucose, UA: NEGATIVE mg/dL
Ketones, ur: NEGATIVE mg/dL
Leukocytes,Ua: NEGATIVE
Protein, ur: NEGATIVE mg/dL
Specific Gravity, Urine: 1.025 (ref 1.005–1.030)
pH: 5.5 (ref 5.0–8.0)

## 2019-10-01 MED ORDER — PHENAZOPYRIDINE HCL 200 MG PO TABS: 200.0000 mg | ORAL_TABLET | Freq: Three times a day (TID) | ORAL | 0 refills | Status: DC | PRN

## 2019-10-01 NOTE — Discharge Instructions (Signed)
It was very nice seeing you today in clinic. Thank you for entrusting me with your care.   Urine did not show infection. Increase WATER intake. Use pyridium to help with your symptoms. If not improving, follow up with your primary care provider in a few days for repeat urine study.   Make arrangements to follow up with your regular doctor in 1 week for re-evaluation if not improving. If your symptoms/condition worsens, please seek follow up care either here or in the ER. Please remember, our [place] providers are "right here with you" when you need [place].   Again, it was my pleasure to take care of you today. Thank you for choosing our clinic. I hope that you start to feel better quickly.   Honor Loh, MSN, APRN, FNP-C, CEN Advanced Practice Provider [facility]

## 2019-10-01 NOTE — ED Triage Notes (Signed)
Pt c/o dysuria, urinary retention and lower back pain. Started about a week and a half ago. Denies fever. She has been taking AZO.

## 2019-10-02 NOTE — ED Provider Notes (Signed)
[place], [place]   Name: CHERA FLADUNG DOB: 09-Sep-1960 MRN: [id number] CSN: [id number] PCP: Casilda Carls, MD  Arrival date and time:  10/01/19 1316  Chief Complaint:  Dysuria   NOTE: Prior to seeing the patient today, I have reviewed the triage nursing documentation and vital signs. Clinical staff has updated patient's PMH/PSHx, current medication list, and drug allergies/intolerances to ensure comprehensive history available to assist in medical decision making.   History:   HPI: JACORA KINNON is a 59 y.o. female who presents today with complaints of urinary symptoms that began about 7-10 days ado. She complains of dysuria and voiding small amounts; no frequency or urgency. She has not appreciated any gross hematuria, nor has she noticed her urine being malodorous. Patient denies any associated nausea, vomiting, fever, or chills. She has not experienced any pain in her flanks or abdomen, but does have some minor lower back pain. PMH (+) for chronic back pain that "flares up from time to time". Patient advises that she does have a past medical history that is significant for recurrent urinary tract infections. She denies any vaginal pain, bleeding, or discharge. Patient has been taking phenazopyridine and drinking more water to help improve her symptoms.   Past Medical History:  Diagnosis Date  . Chronic back pain   . Diabetes ([place])   . HBP (high blood pressure)   . History of blood clots   . Lumbar canal stenosis 11/12/2013  . Migraines   . MS (multiple sclerosis) ([place])   . Sinus complaint   . Sleep apnea     Past Surgical History:  Procedure Laterality Date  . ABDOMINAL HYSTERECTOMY    . BACK SURGERY    . ECTOPIC PREGNANCY SURGERY    . EYE SURGERY Bilateral   . FOOT SURGERY Bilateral   . OOPHORECTOMY Left    pt has right ovary still    Family History  Problem Relation Age of Onset  . Diabetes Mother   . Breast cancer Neg Hx     Social History   Tobacco Use  .  Smoking status: Never Smoker  . Smokeless tobacco: Never Used  Substance Use Topics  . Alcohol use: No  . Drug use: Not Currently    Patient Active Problem List   Diagnosis Date Noted  . Chronic sacroiliac joint pain (Left) 09/20/2015  . Lumbar spondylosis 09/20/2015  . Encounter for therapeutic drug level monitoring 09/19/2015  . Avitaminosis D 09/19/2015  . Lumbar facet syndrome (Location of Secondary source of pain) (Bilateral) (L>R) 09/19/2015  . Neurogenic pain 08/17/2015  . Fibromyalgia 08/17/2015  . Nocturnal muscle cramps (Bilateral lower extremities) 08/17/2015  . Chronic pain 07/21/2015  . Long term current use of opiate analgesic 07/21/2015  . Long term prescription opiate use 07/21/2015  . Opiate use 07/21/2015  . Opioid dependence, daily use ([place]) 07/21/2015  . Uncomplicated opioid dependence ([place]) 07/21/2015  . Adiposity 07/21/2015  . Chronic neck pain (Location of Primary Source of Pain) (Right) 07/21/2015  . Chronic cervical radicular pain (Right) (C7/C8 Dermatome) 07/21/2015  . Chronic low back pain (Location of Secondary source of pain) (Left) 07/21/2015  . Neuropathic pain 07/21/2015  . Musculoskeletal pain 07/21/2015  . Myofascial pain 07/21/2015  . Diffuse myofascial pain syndrome 07/21/2015  . Restless leg syndrome 07/21/2015  . Carpal tunnel syndrome 06/09/2015  . Migraine without aura and responsive to treatment 04/21/2015  . Multiple sclerosis ([place]) 10/04/2014  . Hand paresthesia 10/04/2014  . Disordered sleep 10/04/2014  .  Enthesopathy of hip (Left) 11/12/2013  . Neuropathy 11/12/2013    Home Medications:    Current Facility-Administered Medications for the 10/01/19 encounter [facility] Encounter)  Medication  . lidocaine (PF) (XYLOCAINE) 1 % injection 10 mL   Current Meds  Medication Sig  . acetaminophen (TYLENOL) 500 MG tablet Take 1,000 mg by mouth every 4 (four) hours as needed for pain.  Marland Kitchen aspirin 81 MG EC tablet Take 1 tablet (81 mg  total) by mouth daily. Swallow whole.  . Cholecalciferol (VITAMIN D3) 5000 UNITS TABS Take by mouth.  . esomeprazole (NEXIUM) 40 MG capsule Take 1 capsule (40 mg total) by mouth daily.  Marland Kitchen glipiZIDE (GLUCOTROL XL) 5 MG 24 hr tablet TK 1 T PO QD  . ibuprofen (ADVIL,MOTRIN) 600 MG tablet Take 1 tablet (600 mg total) by mouth every 6 (six) hours as needed.  . interferon beta-1a (REBIF) 22 MCG/0.5ML injection Inject 22 mcg into the skin 3 (three) times a week. Monday Wednesday and Friday  . lovastatin (MEVACOR) 10 MG tablet Take 10 mg by mouth at bedtime.  . [DISCONTINUED] cephALEXin (KEFLEX) 500 MG capsule Take 1 capsule (500 mg total) by mouth 2 (two) times daily.    Allergies:   Patient has no known allergies.  Review of Systems (ROS):  Review of systems NEGATIVE unless otherwise noted in narrative H&P section.   Vital Signs: Today's Vitals   10/01/19 1323 10/01/19 1324 10/01/19 1328 10/01/19 1416  BP:   (!) 154/89   Pulse:   95   Resp:   18   Temp:   98.6 F (37 C)   TempSrc:   Oral   SpO2:   98%   Weight:  240 lb (108.9 kg)    Height:  5' 8.5" (1.74 m)    PainSc: 9    9     Physical Exam: Physical Exam  Constitutional: She is oriented to person, place, and time and well-developed, well-nourished, and in no distress.  HENT:  Head: Normocephalic and atraumatic.  Eyes: Pupils are equal, round, and reactive to light.  Cardiovascular: Normal rate, regular rhythm, normal heart sounds and intact distal pulses.  Pulmonary/Chest: Effort normal and breath sounds normal.  Abdominal: Soft. Normal appearance and bowel sounds are normal. She exhibits no distension. There is abdominal tenderness (pressure) in the suprapubic area. There is no CVA tenderness.  Neurological: She is alert and oriented to person, place, and time. Gait normal.  Skin: Skin is warm and dry. No rash noted. She is not diaphoretic.  Psychiatric: Mood, memory, affect and judgment normal.  Nursing note and vitals  reviewed.   Urgent Care Treatments / Results:   Orders Placed This Encounter  Procedures  . Urinalysis, Complete w Microscopic    LABS: PLEASE NOTE: all labs that were ordered this encounter are listed, however only abnormal results are displayed. Labs Reviewed  URINALYSIS, COMPLETE (UACMP) WITH MICROSCOPIC - Abnormal; Notable for the following components:      Result Value   Color, Urine AMBER (*)    Hgb urine dipstick TRACE (*)    Nitrite   (*)    Value: TEST NOT REPORTED DUE TO COLOR INTERFERENCE OF URINE PIGMENT   All other components within normal limits    EKG: -None  RADIOLOGY: No results found.  PROCEDURES: Procedures  MEDICATIONS RECEIVED THIS VISIT: Medications - No data to display  PERTINENT CLINICAL COURSE NOTES/UPDATES:   Initial Impression / Assessment and Plan / Urgent Care Course:  Pertinent labs & imaging  results that were available during my care of the patient were personally reviewed by me and considered in my medical decision making (see lab/imaging section of note for values and interpretations).  WINSOME PROEFROCK is a 59 y.o. female who presents to [facility] today with complaints of Dysuria  Patient is well appearing overall in clinic today. She does not appear to be in any acute distress. Presenting symptoms (see HPI) and exam as documented above. Exam reveals some minor suprapubic pressure.  Patient is able to void normally.  She has not appreciated any gross hematuria.  Patient took phenazopyridine today and notes some improvement. UA today negative for infection; 0-5 WBC/hpf, 0-5 RBC/hpf, and no nitrites, LE, or bacteria. Will send in Rx for phenazopyridine as patient reports that she only had 1 left. Patient encouraged to increase her fluid intake as much as possible. Discussed that water is always best to flush the urinary tract. She was advised to avoid caffeine containing fluids until her infections clears, as caffeine can cause her to  experience painful bladder spasms. May use Tylenol and/or Ibuprofen as needed for pain/fever.  Discussed follow up with primary care physician in 1 week for re-evaluation. I have reviewed the follow up and strict return precautions for any new or worsening symptoms. Patient is aware of symptoms that would be deemed urgent/emergent, and would thus require further evaluation either here or in the emergency department. At the time of discharge, she verbalized understanding and consent with the discharge plan as it was reviewed with her. All questions were fielded by provider and/or clinic staff prior to patient discharge.    Final Clinical Impressions / Urgent Care Diagnoses:   Final diagnoses:  Suprapubic pressure  Dysuria    New Prescriptions:  [place] Controlled Substance Registry consulted? Not Applicable  Meds ordered this encounter  Medications  . phenazopyridine (PYRIDIUM) 200 MG tablet    Sig: Take 1 tablet (200 mg total) by mouth 3 (three) times daily as needed for pain.    Dispense:  9 tablet    Refill:  0    Recommended Follow up Care:  Patient encouraged to follow up with the following provider within the specified time frame, or sooner as dictated by the severity of her symptoms. As always, she was instructed that for any urgent/emergent care needs, she should seek care either here or in the emergency department for more immediate evaluation.  Follow-up Information    Casilda Carls, MD In 1 week.   Specialty: Internal Medicine Why: General reassessment of symptoms if not improving Contact information: [place] [address] [phone]         NOTE: This note was prepared using Dragon dictation software along with smaller phrase technology. Despite my best ability to proofread, there is the potential that transcriptional errors may still occur from this process, and are completely unintentional.    Karen Kitchens, NP 10/02/19 1416

## 2020-01-12 ENCOUNTER — Other Ambulatory Visit: Payer: Self-pay | Admitting: Internal Medicine

## 2020-01-12 ENCOUNTER — Other Ambulatory Visit: Payer: Self-pay | Admitting: Obstetrics and Gynecology

## 2020-01-12 DIAGNOSIS — Z1231 Encounter for screening mammogram for malignant neoplasm of breast: Secondary | ICD-10-CM

## 2020-01-18 ENCOUNTER — Ambulatory Visit
Admission: RE | Admit: 2020-01-18 | Discharge: 2020-01-18 | Disposition: A | Discharge status: Home / Self Care | Payer: Medicare Other | Source: Ambulatory Visit | Attending: Internal Medicine | Admitting: Internal Medicine

## 2020-01-18 DIAGNOSIS — Z1231 Encounter for screening mammogram for malignant neoplasm of breast: Secondary | ICD-10-CM | POA: Diagnosis not present

## 2020-01-21 ENCOUNTER — Other Ambulatory Visit: Payer: Self-pay | Admitting: Orthopedic Surgery

## 2020-01-21 DIAGNOSIS — M5441 Lumbago with sciatica, right side: Secondary | ICD-10-CM

## 2020-01-21 DIAGNOSIS — M5136 Other intervertebral disc degeneration, lumbar region: Secondary | ICD-10-CM

## 2020-01-21 DIAGNOSIS — M51369 Other intervertebral disc degeneration, lumbar region without mention of lumbar back pain or lower extremity pain: Secondary | ICD-10-CM

## 2020-01-21 DIAGNOSIS — M5442 Lumbago with sciatica, left side: Secondary | ICD-10-CM

## 2020-02-03 ENCOUNTER — Other Ambulatory Visit: Payer: Self-pay

## 2020-02-03 ENCOUNTER — Ambulatory Visit
Admission: RE | Admit: 2020-02-03 | Discharge: 2020-02-03 | Disposition: A | Discharge status: Home / Self Care | Payer: Medicare Other | Source: Ambulatory Visit | Attending: Orthopedic Surgery | Admitting: Orthopedic Surgery

## 2020-02-03 DIAGNOSIS — M5442 Lumbago with sciatica, left side: Secondary | ICD-10-CM | POA: Diagnosis present

## 2020-02-03 DIAGNOSIS — M5441 Lumbago with sciatica, right side: Secondary | ICD-10-CM | POA: Insufficient documentation

## 2020-02-03 DIAGNOSIS — M5136 Other intervertebral disc degeneration, lumbar region: Secondary | ICD-10-CM | POA: Diagnosis present

## 2020-02-19 ENCOUNTER — Other Ambulatory Visit: Payer: Self-pay | Admitting: Neurosurgery

## 2020-02-19 DIAGNOSIS — G894 Chronic pain syndrome: Secondary | ICD-10-CM

## 2020-03-03 ENCOUNTER — Other Ambulatory Visit: Payer: Self-pay | Admitting: Acute Care

## 2020-03-03 ENCOUNTER — Ambulatory Visit: Payer: Medicare Other

## 2020-03-03 ENCOUNTER — Other Ambulatory Visit (HOSPITAL_COMMUNITY): Payer: Self-pay | Admitting: Acute Care

## 2020-03-03 DIAGNOSIS — G35 Multiple sclerosis: Secondary | ICD-10-CM

## 2020-03-07 ENCOUNTER — Ambulatory Visit: Payer: Medicare Other

## 2020-03-07 ENCOUNTER — Other Ambulatory Visit: Payer: Medicare Other

## 2020-03-22 ENCOUNTER — Other Ambulatory Visit: Payer: Self-pay

## 2020-03-22 ENCOUNTER — Ambulatory Visit
Admission: RE | Admit: 2020-03-22 | Discharge: 2020-03-22 | Disposition: A | Discharge status: Home / Self Care | Payer: Medicare Other | Source: Ambulatory Visit | Attending: Acute Care | Admitting: Acute Care

## 2020-03-22 DIAGNOSIS — G35 Multiple sclerosis: Secondary | ICD-10-CM | POA: Diagnosis present

## 2020-03-22 MED ORDER — GADOBUTROL 1 MMOL/ML IV SOLN
10.0000 mL | Freq: Once | INTRAVENOUS | Status: AC | PRN
  Administered 2020-03-22: 10 mL via INTRAVENOUS

## 2020-05-25 ENCOUNTER — Emergency Department: Payer: Medicare Other

## 2020-05-25 ENCOUNTER — Other Ambulatory Visit: Payer: Self-pay

## 2020-05-25 ENCOUNTER — Inpatient Hospital Stay
Admission: EM | Admit: 2020-05-25 | Discharge: 2020-06-06 | DRG: 871 | Disposition: A | Discharge status: Home Health | Payer: Medicare Other | Attending: Internal Medicine | Admitting: Internal Medicine

## 2020-05-25 DIAGNOSIS — M542 Cervicalgia: Secondary | ICD-10-CM | POA: Diagnosis present

## 2020-05-25 DIAGNOSIS — I2693 Single subsegmental pulmonary embolism without acute cor pulmonale: Secondary | ICD-10-CM | POA: Diagnosis present

## 2020-05-25 DIAGNOSIS — R739 Hyperglycemia, unspecified: Secondary | ICD-10-CM | POA: Diagnosis not present

## 2020-05-25 DIAGNOSIS — E785 Hyperlipidemia, unspecified: Secondary | ICD-10-CM | POA: Diagnosis present

## 2020-05-25 DIAGNOSIS — E860 Dehydration: Secondary | ICD-10-CM | POA: Diagnosis present

## 2020-05-25 DIAGNOSIS — E119 Type 2 diabetes mellitus without complications: Secondary | ICD-10-CM | POA: Diagnosis not present

## 2020-05-25 DIAGNOSIS — I10 Essential (primary) hypertension: Secondary | ICD-10-CM | POA: Diagnosis present

## 2020-05-25 DIAGNOSIS — Z7982 Long term (current) use of aspirin: Secondary | ICD-10-CM

## 2020-05-25 DIAGNOSIS — J1282 Pneumonia due to coronavirus disease 2019: Secondary | ICD-10-CM | POA: Diagnosis present

## 2020-05-25 DIAGNOSIS — R197 Diarrhea, unspecified: Secondary | ICD-10-CM | POA: Diagnosis present

## 2020-05-25 DIAGNOSIS — R6520 Severe sepsis without septic shock: Secondary | ICD-10-CM | POA: Diagnosis present

## 2020-05-25 DIAGNOSIS — Z833 Family history of diabetes mellitus: Secondary | ICD-10-CM

## 2020-05-25 DIAGNOSIS — Z23 Encounter for immunization: Secondary | ICD-10-CM | POA: Diagnosis not present

## 2020-05-25 DIAGNOSIS — M48061 Spinal stenosis, lumbar region without neurogenic claudication: Secondary | ICD-10-CM | POA: Diagnosis present

## 2020-05-25 DIAGNOSIS — G43909 Migraine, unspecified, not intractable, without status migrainosus: Secondary | ICD-10-CM | POA: Diagnosis present

## 2020-05-25 DIAGNOSIS — Z79899 Other long term (current) drug therapy: Secondary | ICD-10-CM

## 2020-05-25 DIAGNOSIS — N179 Acute kidney failure, unspecified: Secondary | ICD-10-CM | POA: Diagnosis present

## 2020-05-25 DIAGNOSIS — E1165 Type 2 diabetes mellitus with hyperglycemia: Secondary | ICD-10-CM | POA: Diagnosis not present

## 2020-05-25 DIAGNOSIS — R000 Tachycardia, unspecified: Secondary | ICD-10-CM | POA: Diagnosis present

## 2020-05-25 DIAGNOSIS — E669 Obesity, unspecified: Secondary | ICD-10-CM | POA: Diagnosis present

## 2020-05-25 DIAGNOSIS — G35 Multiple sclerosis: Secondary | ICD-10-CM | POA: Diagnosis present

## 2020-05-25 DIAGNOSIS — Z283 Underimmunization status: Secondary | ICD-10-CM

## 2020-05-25 DIAGNOSIS — G4700 Insomnia, unspecified: Secondary | ICD-10-CM | POA: Diagnosis present

## 2020-05-25 DIAGNOSIS — F1120 Opioid dependence, uncomplicated: Secondary | ICD-10-CM | POA: Diagnosis present

## 2020-05-25 DIAGNOSIS — E1100 Type 2 diabetes mellitus with hyperosmolarity without nonketotic hyperglycemic-hyperosmolar coma (NKHHC): Secondary | ICD-10-CM | POA: Diagnosis not present

## 2020-05-25 DIAGNOSIS — U071 COVID-19: Secondary | ICD-10-CM | POA: Diagnosis present

## 2020-05-25 DIAGNOSIS — E861 Hypovolemia: Secondary | ICD-10-CM | POA: Diagnosis present

## 2020-05-25 DIAGNOSIS — Z6838 Body mass index (BMI) 38.0-38.9, adult: Secondary | ICD-10-CM

## 2020-05-25 DIAGNOSIS — A4189 Other specified sepsis: Principal | ICD-10-CM | POA: Diagnosis present

## 2020-05-25 DIAGNOSIS — R0902 Hypoxemia: Secondary | ICD-10-CM

## 2020-05-25 DIAGNOSIS — E872 Acidosis: Secondary | ICD-10-CM | POA: Diagnosis present

## 2020-05-25 DIAGNOSIS — M47896 Other spondylosis, lumbar region: Secondary | ICD-10-CM | POA: Diagnosis present

## 2020-05-25 DIAGNOSIS — M797 Fibromyalgia: Secondary | ICD-10-CM | POA: Diagnosis present

## 2020-05-25 DIAGNOSIS — E876 Hypokalemia: Secondary | ICD-10-CM | POA: Diagnosis present

## 2020-05-25 DIAGNOSIS — R609 Edema, unspecified: Secondary | ICD-10-CM

## 2020-05-25 DIAGNOSIS — F32A Depression, unspecified: Secondary | ICD-10-CM | POA: Diagnosis present

## 2020-05-25 DIAGNOSIS — T380X5A Adverse effect of glucocorticoids and synthetic analogues, initial encounter: Secondary | ICD-10-CM | POA: Diagnosis not present

## 2020-05-25 DIAGNOSIS — G4730 Sleep apnea, unspecified: Secondary | ICD-10-CM | POA: Diagnosis present

## 2020-05-25 DIAGNOSIS — J9601 Acute respiratory failure with hypoxia: Secondary | ICD-10-CM | POA: Diagnosis present

## 2020-05-25 DIAGNOSIS — A419 Sepsis, unspecified organism: Secondary | ICD-10-CM | POA: Diagnosis not present

## 2020-05-25 DIAGNOSIS — E1140 Type 2 diabetes mellitus with diabetic neuropathy, unspecified: Secondary | ICD-10-CM | POA: Diagnosis present

## 2020-05-25 DIAGNOSIS — Z7984 Long term (current) use of oral hypoglycemic drugs: Secondary | ICD-10-CM

## 2020-05-25 DIAGNOSIS — G8929 Other chronic pain: Secondary | ICD-10-CM | POA: Diagnosis present

## 2020-05-25 LAB — URINALYSIS, COMPLETE (UACMP) WITH MICROSCOPIC
Bilirubin Urine: NEGATIVE
Glucose, UA: >=500 mg/dL — AB
Ketones, ur: 5 mg/dL — AB
Leukocytes,Ua: NEGATIVE
Nitrite: NEGATIVE
Protein, ur: 30 mg/dL — AB
Specific Gravity, Urine: 1.028 (ref 1.005–1.030)
pH: 6 (ref 5.0–8.0)

## 2020-05-25 LAB — FERRITIN: Ferritin: 245 ng/mL (ref 11–307)

## 2020-05-25 LAB — BASIC METABOLIC PANEL
Anion gap: 15 (ref 5–15)
Anion gap: 15 (ref 5–15)
Anion gap: 13 (ref 5–15)
BUN: 27 mg/dL — ABNORMAL HIGH (ref 6–20)
BUN: 22 mg/dL — ABNORMAL HIGH (ref 6–20)
BUN: 18 mg/dL (ref 6–20)
CO2: 19 mmol/L — ABNORMAL LOW (ref 22–32)
CO2: 20 mmol/L — ABNORMAL LOW (ref 22–32)
CO2: 19 mmol/L — ABNORMAL LOW (ref 22–32)
Calcium: 9.1 mg/dL (ref 8.9–10.3)
Calcium: 9.3 mg/dL (ref 8.9–10.3)
Calcium: 8.7 mg/dL — ABNORMAL LOW (ref 8.9–10.3)
Chloride: 104 mmol/L (ref 98–111)
Chloride: 108 mmol/L (ref 98–111)
Chloride: 115 mmol/L — ABNORMAL HIGH (ref 98–111)
Creatinine, Ser: 1.94 mg/dL — ABNORMAL HIGH (ref 0.44–1.00)
Creatinine, Ser: 1.51 mg/dL — ABNORMAL HIGH (ref 0.44–1.00)
Creatinine, Ser: 1.18 mg/dL — ABNORMAL HIGH (ref 0.44–1.00)
GFR, Estimated: 28 mL/min — ABNORMAL LOW (ref >60)
GFR, Estimated: 38 mL/min — ABNORMAL LOW (ref >60)
GFR, Estimated: 51 mL/min — ABNORMAL LOW (ref >60)
Glucose, Bld: 753 mg/dL (ref 70–99)
Glucose, Bld: 476 mg/dL — ABNORMAL HIGH (ref 70–99)
Glucose, Bld: 271 mg/dL — ABNORMAL HIGH (ref 70–99)
Potassium: 3.3 mmol/L — ABNORMAL LOW (ref 3.5–5.1)
Potassium: 3.2 mmol/L — ABNORMAL LOW (ref 3.5–5.1)
Potassium: 3.7 mmol/L (ref 3.5–5.1)
Sodium: 138 mmol/L (ref 135–145)
Sodium: 143 mmol/L (ref 135–145)
Sodium: 147 mmol/L — ABNORMAL HIGH (ref 135–145)

## 2020-05-25 LAB — BLOOD GAS, VENOUS
Acid-base deficit: 6.6 mmol/L — ABNORMAL HIGH (ref 0.0–2.0)
Bicarbonate: 18.5 mmol/L — ABNORMAL LOW (ref 20.0–28.0)
O2 Saturation: 75.8 %
Patient temperature: 37
pCO2, Ven: 35 mmHg — ABNORMAL LOW (ref 44.0–60.0)
pH, Ven: 7.33 (ref 7.250–7.430)
pO2, Ven: 44 mmHg (ref 32.0–45.0)

## 2020-05-25 LAB — HEPATIC FUNCTION PANEL
ALT: 23 U/L (ref 0–44)
AST: 30 U/L (ref 15–41)
Albumin: 3.6 g/dL (ref 3.5–5.0)
Alkaline Phosphatase: 80 U/L (ref 38–126)
Bilirubin, Direct: 0.2 mg/dL (ref 0.0–0.2)
Indirect Bilirubin: 0.7 mg/dL (ref 0.3–0.9)
Total Bilirubin: 0.9 mg/dL (ref 0.3–1.2)
Total Protein: 8 g/dL (ref 6.5–8.1)

## 2020-05-25 LAB — LACTATE DEHYDROGENASE: LDH: 410 U/L — ABNORMAL HIGH (ref 98–192)

## 2020-05-25 LAB — CBC WITH DIFFERENTIAL/PLATELET
Abs Immature Granulocytes: 0.07 10*3/uL (ref 0.00–0.07)
Basophils Absolute: 0 10*3/uL (ref 0.0–0.1)
Basophils Relative: 0 %
Eosinophils Absolute: 0 10*3/uL (ref 0.0–0.5)
Eosinophils Relative: 0 %
HCT: 38 % (ref 36.0–46.0)
Hemoglobin: 12.6 g/dL (ref 12.0–15.0)
Immature Granulocytes: 1 %
Lymphocytes Relative: 12 %
Lymphs Abs: 1.2 10*3/uL (ref 0.7–4.0)
MCH: 28.4 pg (ref 26.0–34.0)
MCHC: 33.2 g/dL (ref 30.0–36.0)
MCV: 85.8 fL (ref 80.0–100.0)
Monocytes Absolute: 0.6 10*3/uL (ref 0.1–1.0)
Monocytes Relative: 5 %
Neutro Abs: 8.6 10*3/uL — ABNORMAL HIGH (ref 1.7–7.7)
Neutrophils Relative %: 82 %
Platelets: 384 10*3/uL (ref 150–400)
RBC: 4.43 MIL/uL (ref 3.87–5.11)
RDW: 13.7 % (ref 11.5–15.5)
WBC: 10.5 10*3/uL (ref 4.0–10.5)
nRBC: 0 % (ref 0.0–0.2)

## 2020-05-25 LAB — RESPIRATORY PANEL BY RT PCR (FLU A&B, COVID)
Influenza A by PCR: NEGATIVE
Influenza B by PCR: NEGATIVE
SARS Coronavirus 2 by RT PCR: POSITIVE — AB

## 2020-05-25 LAB — GLUCOSE, CAPILLARY
Glucose-Capillary: 258 mg/dL — ABNORMAL HIGH (ref 70–99)
Glucose-Capillary: 263 mg/dL — ABNORMAL HIGH (ref 70–99)
Glucose-Capillary: 309 mg/dL — ABNORMAL HIGH (ref 70–99)
Glucose-Capillary: 312 mg/dL — ABNORMAL HIGH (ref 70–99)
Glucose-Capillary: 382 mg/dL — ABNORMAL HIGH (ref 70–99)
Glucose-Capillary: 438 mg/dL — ABNORMAL HIGH (ref 70–99)
Glucose-Capillary: 474 mg/dL — ABNORMAL HIGH (ref 70–99)
Glucose-Capillary: 567 mg/dL (ref 70–99)

## 2020-05-25 LAB — BRAIN NATRIURETIC PEPTIDE: B Natriuretic Peptide: 45.8 pg/mL (ref 0.0–100.0)

## 2020-05-25 LAB — HIV ANTIBODY (ROUTINE TESTING W REFLEX): HIV Screen 4th Generation wRfx: NONREACTIVE

## 2020-05-25 LAB — TROPONIN I (HIGH SENSITIVITY): Troponin I (High Sensitivity): 24 ng/L — ABNORMAL HIGH (ref <18)

## 2020-05-25 LAB — HEPATITIS B SURFACE ANTIGEN: Hepatitis B Surface Ag: NONREACTIVE

## 2020-05-25 LAB — PROCALCITONIN: Procalcitonin: 0.21 ng/mL

## 2020-05-25 LAB — OSMOLALITY: Osmolality: 332 mOsm/kg (ref 275–295)

## 2020-05-25 LAB — C-REACTIVE PROTEIN: CRP: 9.1 mg/dL — ABNORMAL HIGH (ref <1.0)

## 2020-05-25 LAB — FIBRINOGEN: Fibrinogen: 695 mg/dL — ABNORMAL HIGH (ref 210–475)

## 2020-05-25 LAB — FIBRIN DERIVATIVES D-DIMER (ARMC ONLY): Fibrin derivatives D-dimer (ARMC): 3941.94 ng/mL (FEU) — ABNORMAL HIGH (ref 0.00–499.00)

## 2020-05-25 LAB — TRIGLYCERIDES: Triglycerides: 197 mg/dL — ABNORMAL HIGH (ref <150)

## 2020-05-25 LAB — BETA-HYDROXYBUTYRIC ACID: Beta-Hydroxybutyric Acid: 1.62 mmol/L — ABNORMAL HIGH (ref 0.05–0.27)

## 2020-05-25 LAB — MAGNESIUM: Magnesium: 2.2 mg/dL (ref 1.7–2.4)

## 2020-05-25 LAB — LACTIC ACID, PLASMA: Lactic Acid, Venous: 3.3 mmol/L (ref 0.5–1.9)

## 2020-05-25 MED ORDER — TOPIRAMATE 100 MG PO TABS
100.0000 mg | ORAL_TABLET | Freq: Two times a day (BID) | ORAL | Status: DC
  Administered 2020-05-26 – 2020-06-06 (×24): 100 mg via ORAL
  Filled 2020-05-25: qty 1
  Filled 2020-05-25: qty 4
  Filled 2020-05-25 (×13): qty 1
  Filled 2020-05-25: qty 4
  Filled 2020-05-25 (×10): qty 1

## 2020-05-25 MED ORDER — ASPIRIN EC 81 MG PO TBEC
81.0000 mg | DELAYED_RELEASE_TABLET | Freq: Every day | ORAL | Status: DC
  Administered 2020-05-26 – 2020-06-05 (×11): 81 mg via ORAL
  Filled 2020-05-25 (×11): qty 1

## 2020-05-25 MED ORDER — SUMATRIPTAN SUCCINATE 50 MG PO TABS
100.0000 mg | ORAL_TABLET | ORAL | Status: DC | PRN
  Administered 2020-05-28 – 2020-05-29 (×3): 100 mg via ORAL
  Filled 2020-05-25 (×5): qty 2

## 2020-05-25 MED ORDER — SODIUM CHLORIDE 0.9 % IV SOLN: INTRAVENOUS | Status: DC

## 2020-05-25 MED ORDER — LACTATED RINGERS IV BOLUS
1000.0000 mL | Freq: Once | INTRAVENOUS | Status: AC
  Administered 2020-05-25: 1000 mL via INTRAVENOUS

## 2020-05-25 MED ORDER — GABAPENTIN 400 MG PO CAPS
800.0000 mg | ORAL_CAPSULE | Freq: Two times a day (BID) | ORAL | Status: DC
  Administered 2020-05-26 – 2020-06-06 (×23): 800 mg via ORAL
  Filled 2020-05-25 (×25): qty 2

## 2020-05-25 MED ORDER — IPRATROPIUM BROMIDE HFA 17 MCG/ACT IN AERS
2.0000 | INHALATION_SPRAY | RESPIRATORY_TRACT | Status: DC
  Administered 2020-05-26 – 2020-06-06 (×70): 2 via RESPIRATORY_TRACT
  Filled 2020-05-25 (×2): qty 12.9

## 2020-05-25 MED ORDER — METHYLPREDNISOLONE SODIUM SUCC 125 MG IJ SOLR
60.0000 mg | Freq: Two times a day (BID) | INTRAMUSCULAR | Status: DC
  Administered 2020-05-25 – 2020-06-04 (×20): 60 mg via INTRAVENOUS
  Filled 2020-05-25 (×20): qty 2

## 2020-05-25 MED ORDER — AMLODIPINE BESYLATE 5 MG PO TABS
5.0000 mg | ORAL_TABLET | Freq: Every day | ORAL | Status: DC
  Administered 2020-05-26 – 2020-06-05 (×11): 5 mg via ORAL
  Filled 2020-05-25 (×11): qty 1

## 2020-05-25 MED ORDER — ACETAMINOPHEN 325 MG PO TABS
650.0000 mg | ORAL_TABLET | Freq: Four times a day (QID) | ORAL | Status: DC | PRN
  Administered 2020-05-25 – 2020-06-06 (×7): 650 mg via ORAL
  Filled 2020-05-25 (×8): qty 2

## 2020-05-25 MED ORDER — ASCORBIC ACID 500 MG PO TABS
500.0000 mg | ORAL_TABLET | Freq: Every day | ORAL | Status: DC
  Administered 2020-05-26 – 2020-06-06 (×12): 500 mg via ORAL
  Filled 2020-05-25 (×12): qty 1

## 2020-05-25 MED ORDER — DEXTROSE 50 % IV SOLN
0.0000 mL | INTRAVENOUS | Status: DC | PRN
  Administered 2020-05-27: 50 mL via INTRAVENOUS
  Filled 2020-05-25: qty 50

## 2020-05-25 MED ORDER — BACLOFEN 10 MG PO TABS
5.0000 mg | ORAL_TABLET | Freq: Every day | ORAL | Status: DC
  Administered 2020-05-26 – 2020-06-05 (×12): 5 mg via ORAL
  Filled 2020-05-25 (×14): qty 0.5

## 2020-05-25 MED ORDER — ONDANSETRON HCL 4 MG/2ML IJ SOLN: 4.0000 mg | Freq: Three times a day (TID) | INTRAMUSCULAR | Status: DC | PRN

## 2020-05-25 MED ORDER — POTASSIUM CHLORIDE CRYS ER 20 MEQ PO TBCR
40.0000 meq | EXTENDED_RELEASE_TABLET | Freq: Once | ORAL | Status: AC
  Administered 2020-05-25: 40 meq via ORAL
  Filled 2020-05-25: qty 2

## 2020-05-25 MED ORDER — ENOXAPARIN SODIUM 40 MG/0.4ML ~~LOC~~ SOLN
40.0000 mg | SUBCUTANEOUS | Status: DC
  Administered 2020-05-26 – 2020-05-29 (×5): 40 mg via SUBCUTANEOUS
  Filled 2020-05-25 (×5): qty 0.4

## 2020-05-25 MED ORDER — ZINC SULFATE 220 (50 ZN) MG PO CAPS
220.0000 mg | ORAL_CAPSULE | Freq: Every day | ORAL | Status: DC
  Administered 2020-05-26 – 2020-06-06 (×12): 220 mg via ORAL
  Filled 2020-05-25 (×12): qty 1

## 2020-05-25 MED ORDER — INSULIN REGULAR(HUMAN) IN NACL 100-0.9 UT/100ML-% IV SOLN
INTRAVENOUS | Status: DC
  Administered 2020-05-25: 17 [IU]/h via INTRAVENOUS
  Filled 2020-05-25 (×2): qty 100

## 2020-05-25 MED ORDER — INSULIN ASPART 100 UNIT/ML ~~LOC~~ SOLN: 15.0000 [IU] | Freq: Once | SUBCUTANEOUS | Status: DC

## 2020-05-25 MED ORDER — PANTOPRAZOLE SODIUM 40 MG PO TBEC
40.0000 mg | DELAYED_RELEASE_TABLET | Freq: Every day | ORAL | Status: DC | PRN
  Administered 2020-05-27: 40 mg via ORAL

## 2020-05-25 MED ORDER — TIZANIDINE HCL 4 MG PO TABS
4.0000 mg | ORAL_TABLET | Freq: Three times a day (TID) | ORAL | Status: DC
  Administered 2020-05-26 – 2020-06-06 (×35): 4 mg via ORAL
  Filled 2020-05-25 (×40): qty 1

## 2020-05-25 MED ORDER — SODIUM CHLORIDE 0.9 % IV SOLN
100.0000 mg | Freq: Every day | INTRAVENOUS | Status: AC
  Administered 2020-05-26 – 2020-05-29 (×4): 100 mg via INTRAVENOUS
  Filled 2020-05-25 (×3): qty 100
  Filled 2020-05-25: qty 20

## 2020-05-25 MED ORDER — DEXAMETHASONE SODIUM PHOSPHATE 10 MG/ML IJ SOLN: 10.0000 mg | Freq: Once | INTRAMUSCULAR | Status: DC

## 2020-05-25 MED ORDER — SODIUM CHLORIDE 0.9 % IV SOLN
200.0000 mg | Freq: Once | INTRAVENOUS | Status: AC
  Administered 2020-05-25: 200 mg via INTRAVENOUS
  Filled 2020-05-25: qty 200

## 2020-05-25 MED ORDER — METHYLPREDNISOLONE SODIUM SUCC 40 MG IJ SOLR: 40.0000 mg | Freq: Two times a day (BID) | INTRAMUSCULAR | Status: DC

## 2020-05-25 MED ORDER — ALBUTEROL SULFATE HFA 108 (90 BASE) MCG/ACT IN AERS
2.0000 | INHALATION_SPRAY | RESPIRATORY_TRACT | Status: DC | PRN
  Filled 2020-05-25: qty 6.7

## 2020-05-25 MED ORDER — ATORVASTATIN CALCIUM 10 MG PO TABS
10.0000 mg | ORAL_TABLET | Freq: Every day | ORAL | Status: DC
  Administered 2020-05-26 – 2020-06-06 (×12): 10 mg via ORAL
  Filled 2020-05-25 (×12): qty 1

## 2020-05-25 MED ORDER — DEXTROSE IN LACTATED RINGERS 5 % IV SOLN: INTRAVENOUS | Status: DC

## 2020-05-25 MED ORDER — HYDRALAZINE HCL 20 MG/ML IJ SOLN
5.0000 mg | INTRAMUSCULAR | Status: DC | PRN
  Administered 2020-05-26 – 2020-05-27 (×2): 5 mg via INTRAVENOUS
  Filled 2020-05-25 (×2): qty 1

## 2020-05-25 NOTE — ED Notes (Addendum)
RN entered room to find patient had removed [place] for unknown amount of time. RN replaced [place] and titrate 02 to 6L. Pt sats remain between 84-86%. MD notified.

## 2020-05-25 NOTE — ED Triage Notes (Signed)
Pt lives in house with Covid+. Reports stated "feeling bad" a few days ago. EMS reports "HIGH" CBG reading

## 2020-05-25 NOTE — Consult Note (Signed)
NAME:  Gabriella Robinson, MRN:  [id number], DOB:  [birth year], LOS: 0 ADMISSION DATE:  05/25/2020, CONSULTATION DATE:  05/25/2020 REFERRING MD:  Hospitalist, CHIEF COMPLAINT:  Shortness of breath   Brief History   59yo female w/complaints of shortness of breath, cough, fever, chills and N/V/D x5days who was found to be in DKA and postive for COVID  History of present illness   59 year old female with a past medical history significant for multiple sclerosis, hypertension, hyperlipidemia, diabetes mellitus, migraine headaches, chronic pain with opioid dependence and fibromyalgia who presented to the ED from home with shortness of breath, cough, fever, chills and N/V/D. The patient reports her symptoms began approximately 5 days ago. She did have a positive exposure to Covid in her home.    On arrival to the ED, the patient's oxygen saturations were 84% on room air which improved to mid 90s on 4 L nasal cannula. However patient began to desat again into the mid 80s and  was started on high flow nasal cannula.  ED work-up revealed a positive Covid PCR, blood sugar 753, bicarb 19, anion gap 15, beta hydroxybutyric 1.62, urinalysis positive for ketones.  Chest x-ray revealed bilateral infiltrates.  Past Medical History  Chronic back pain Diabetes mellitus Hypertension History of blood clots Lumbar canal stenosis Migraines Multiple sclerosis Sleep apnea Opioid dependence  Significant Hospital Events   Admitted 05/25/2020  Consults:  PCCM    Procedures:    Significant Diagnostic Tests:  10/20 CXR: Moderate bilateral pulmonary infiltrates  Micro Data:  COVID PCR + FLU - Urinalysis - Blood cultures >>  Antimicrobials:       Interim history/subjective:  10/20: PCCM Consulted   Objective   Blood pressure (!) 178/88, pulse (!) 103, temperature 99.1 F (37.3 C), temperature source Oral, resp. rate 19, height 5\' 8"  (1.727 m), weight 90.7 kg, SpO2 (!) 84 %.       No intake or  output data in the 24 hours ending 05/25/20 1813 Filed Weights   05/25/20 1337  Weight: 90.7 kg    Examination: General: Alert and oriented x3, in mild distress HENT: Neck supple, no JVD, dry oral mucosa Lungs: Coarse and diminshed throughout Cardiovascular: Heart sounds S1S2, no murmur or gallops Abdomen: Soft, non-distended Extremities: Warm, pulses x4  Neurological: Alert and oriented x4, no focal deficits  Resolved Hospital Problem list     Assessment & Plan:  Acute hypoxemic respiratory failure 2/2 COVID Pneumonia --Continue HFNC and wean as tolerated to maintain oxygen saturation >92% --Remdesivir --Solumedrol --Vitamin D/Zinc --Encourage self proning --PRN duonebs  Sepsis --Likely 2/2 above --Tachycardic, tachypneic --Procalcitonin pending, will evaluate need for antibiotics once resulted --Blood cultures pending, UA negative  Type II Diabetes Hyperosmolar hyperglycemic state --Blood sugar 753, Anion Gap --Received 2L in ED --Insulin gtt --NS currently at 50cc/hr, transition to D51/2NS when BS <250 --BMP Q4hr --Home medications: Metformin and glipizide which are being held   Acute Kidney Injury --Cr 1.94 on admission w/no prior history of kidney dysfunction --Received 2L IVFs --Avoid nephrotoxins --Monitor BMP --Strict I&Os  Hypertension Hyperlipidemia --Home medications: HCTZ and Cozaar and held due to AKI --Continue Norvasc --PRN IV hydralazine --Continue home lipitor  Chronic Pain --Home medications: Gabapentin, Baclofen, topamax. Sumatriptan --All meds continued   Best practice:  Diet: NPO   Pain/Anxiety/Delirium protocol (if indicated): Home medication resumed VAP protocol (if indicated):  DVT prophylaxis: Lovenox GI prophylaxis: Not indicated Glucose control: Insulin gtt  Mobility: As tolerated Code Status: Full  Family Communication:  Disposition:   Labs   CBC: Recent Labs  Lab 05/25/20 1343  WBC 10.5  NEUTROABS 8.6*  HGB  12.6  HCT 38.0  MCV 85.8  PLT 470    Basic Metabolic Panel: Recent Labs  Lab 05/25/20 1343  NA 138  K 3.3*  CL 104  CO2 19*  GLUCOSE 753*  BUN 27*  CREATININE 1.94*  CALCIUM 9.1   GFR: Estimated Creatinine Clearance: 37.2 mL/min (A) (by C-G formula based on SCr of 1.94 mg/dL (H)). Recent Labs  Lab 05/25/20 1343  WBC 10.5    Liver Function Tests: No results for input(s): AST, ALT, ALKPHOS, BILITOT, PROT, ALBUMIN in the last 168 hours. No results for input(s): LIPASE, AMYLASE in the last 168 hours. No results for input(s): AMMONIA in the last 168 hours.  ABG    Component Value Date/Time   HCO3 18.5 (L) 05/25/2020 1343   ACIDBASEDEF 6.6 (H) 05/25/2020 1343   O2SAT 75.8 05/25/2020 1343     Coagulation Profile: No results for input(s): INR, PROTIME in the last 168 hours.  Cardiac Enzymes: No results for input(s): CKTOTAL, CKMB, CKMBINDEX, TROPONINI in the last 168 hours.  HbA1C: No results found for: HGBA1C  CBG: Recent Labs  Lab 05/25/20 1658 05/25/20 1737  GLUCAP 567* 474*    Review of Systems:   Complains of malaise, chills, nausea. Reports improvement in shortness of breath  Past Medical History  She,  has a past medical history of Chronic back pain, Diabetes ([place]), HBP (high blood pressure), History of blood clots, Lumbar canal stenosis (11/12/2013), Migraines, MS (multiple sclerosis) ([place]), Sinus complaint, and Sleep apnea.   Surgical History    Past Surgical History:  Procedure Laterality Date   ABDOMINAL HYSTERECTOMY     BACK SURGERY     ECTOPIC PREGNANCY SURGERY     EYE SURGERY Bilateral    FOOT SURGERY Bilateral    OOPHORECTOMY Left    pt has right ovary still     Social History   reports that she has never smoked. She has never used smokeless tobacco. She reports previous drug use. She reports that she does not drink alcohol.   Family History   Her family history includes Diabetes in her mother. There is no history of Breast  cancer.   Allergies No Known Allergies   Home Medications  Prior to Admission medications   Medication Sig Start Date End Date Taking? Authorizing Provider  aspirin 81 MG EC tablet Take 1 tablet (81 mg total) by mouth daily. Swallow whole. 01/09/17  Yes Earleen Newport, MD  atorvastatin (LIPITOR) 10 MG tablet Take 10 mg by mouth daily. 03/01/20  Yes [provider]  Baclofen 5 MG TABS Take 5 mg by mouth at bedtime.  04/27/20  Yes [provider]  EMGALITY 120 MG/ML SOSY Inject 120 mg into the skin every 28 (twenty-eight) days. 04/12/20  Yes [provider]  gabapentin (NEURONTIN) 800 MG tablet Take 800 mg by mouth 2 (two) times daily. 04/30/20  Yes [provider]  glipiZIDE (GLUCOTROL XL) 5 MG 24 hr tablet Take 5 mg by mouth daily with breakfast.  07/16/15  Yes [provider]  hydrochlorothiazide (HYDRODIURIL) 25 MG tablet Take 25 mg by mouth daily. 03/01/20  Yes [provider]  interferon beta-1a (REBIF) 22 MCG/0.5ML injection Inject 22 mcg into the skin 3 (three) times a week. Monday Wednesday and Friday   Yes [provider]  losartan (COZAAR) 50 MG tablet Take 50 mg by  mouth daily.  03/01/20  Yes [provider]  meloxicam (MOBIC) 7.5 MG tablet Take 7.5 mg by mouth 2 (two) times daily. 04/25/20  Yes [provider]  metFORMIN (GLUCOPHAGE) 1000 MG tablet Take 1,000 mg by mouth 2 (two) times daily. 03/04/20  Yes [provider]  tiZANidine (ZANAFLEX) 4 MG tablet Take 4 mg by mouth 3 (three) times daily. 04/27/20  Yes [provider]  TOPAMAX 100 MG tablet Take 100 mg by mouth 2 (two) times daily. 05/23/20  Yes [provider]  acetaminophen (TYLENOL) 500 MG tablet Take 1,000 mg by mouth every 4 (four) hours as needed for pain.    [provider]  esomeprazole (NEXIUM) 40 MG capsule Take 1 capsule (40 mg total) by mouth daily. Patient not taking: Reported on 05/25/2020 01/09/17  10/01/19  Earleen Newport, MD  ibuprofen (ADVIL,MOTRIN) 600 MG tablet Take 1 tablet (600 mg total) by mouth every 6 (six) hours as needed. Patient not taking: Reported on 05/25/2020 09/21/17   Melynda Ripple, MD  ondansetron (ZOFRAN) 4 MG tablet Take 4 mg by mouth every 8 (eight) hours as needed. 05/23/20   [provider]  phenazopyridine (PYRIDIUM) 200 MG tablet Take 1 tablet (200 mg total) by mouth 3 (three) times daily as needed for pain. Patient not taking: Reported on 05/25/2020 10/01/19   Karen Kitchens, NP  SUMAtriptan (IMITREX) 100 MG tablet Take 100 mg by mouth every 2 (two) hours as needed.  05/20/20   [provider]  sitaGLIPtin-metformin (JANUMET) 50-500 MG tablet Take 1 tablet by mouth 2 (two) times daily with a meal.  08/10/14 10/01/19  [provider]  traZODone (DESYREL) 50 MG tablet TAKE 1 TO 2 TABLETS BY MOUTH EVERY NIGHT FOR SLEEP 07/28/15 10/01/19  [provider]    Tonye Royalty ACNP-BC

## 2020-05-25 NOTE — ED Provider Notes (Signed)
[facility] Emergency Department Provider Note   ____________________________________________   I have reviewed the triage vital signs and the nursing notes.   HISTORY  Chief Complaint Feeling unwell  History limited by: Not Limited   HPI Gabriella Robinson is a 59 y.o. female who presents to the emergency department today because of concern for being sick. She started feeling bad roughly 5 days ago. Her symptoms include fevers, body aches, shortness of breath, cough, nausea and vomiting. She has had generalized weakness. Has had exposure to COVID in her household. The patient states that she has been taking her diabetic medication as prescribed. She has not been checking her blood sugars.    Records reviewed. Per medical record review patient has a history of diabetes, MS.   Past Medical History:  Diagnosis Date  . Chronic back pain   . Diabetes ([place])   . HBP (high blood pressure)   . History of blood clots   . Lumbar canal stenosis 11/12/2013  . Migraines   . MS (multiple sclerosis) ([place])   . Sinus complaint   . Sleep apnea     Patient Active Problem List   Diagnosis Date Noted  . Chronic sacroiliac joint pain (Left) 09/20/2015  . Lumbar spondylosis 09/20/2015  . Encounter for therapeutic drug level monitoring 09/19/2015  . Avitaminosis D 09/19/2015  . Lumbar facet syndrome (Location of Secondary source of pain) (Bilateral) (L>R) 09/19/2015  . Neurogenic pain 08/17/2015  . Fibromyalgia 08/17/2015  . Nocturnal muscle cramps (Bilateral lower extremities) 08/17/2015  . Chronic pain 07/21/2015  . Long term current use of opiate analgesic 07/21/2015  . Long term prescription opiate use 07/21/2015  . Opiate use 07/21/2015  . Opioid dependence, daily use ([place]) 07/21/2015  . Uncomplicated opioid dependence ([place]) 07/21/2015  . Adiposity 07/21/2015  . Chronic neck pain (Location of Primary Source of Pain) (Right) 07/21/2015  . Chronic cervical radicular  pain (Right) (C7/C8 Dermatome) 07/21/2015  . Chronic low back pain (Location of Secondary source of pain) (Left) 07/21/2015  . Neuropathic pain 07/21/2015  . Musculoskeletal pain 07/21/2015  . Myofascial pain 07/21/2015  . Diffuse myofascial pain syndrome 07/21/2015  . Restless leg syndrome 07/21/2015  . Carpal tunnel syndrome 06/09/2015  . Migraine without aura and responsive to treatment 04/21/2015  . Multiple sclerosis ([place]) 10/04/2014  . Hand paresthesia 10/04/2014  . Disordered sleep 10/04/2014  . Enthesopathy of hip (Left) 11/12/2013  . Neuropathy 11/12/2013    Past Surgical History:  Procedure Laterality Date  . ABDOMINAL HYSTERECTOMY    . BACK SURGERY    . ECTOPIC PREGNANCY SURGERY    . EYE SURGERY Bilateral   . FOOT SURGERY Bilateral   . OOPHORECTOMY Left    pt has right ovary still    Prior to Admission medications   Medication Sig Start Date End Date Taking? Authorizing Provider  acetaminophen (TYLENOL) 500 MG tablet Take 1,000 mg by mouth every 4 (four) hours as needed for pain.    [provider]  aspirin 81 MG EC tablet Take 1 tablet (81 mg total) by mouth daily. Swallow whole. 01/09/17   Earleen Newport, MD  Cholecalciferol (VITAMIN D3) 5000 UNITS TABS Take by mouth.    [provider]  esomeprazole (NEXIUM) 40 MG capsule Take 1 capsule (40 mg total) by mouth daily. 01/09/17 10/01/19  Earleen Newport, MD  glipiZIDE (GLUCOTROL XL) 5 MG 24 hr tablet TK 1 T PO QD 07/16/15   [provider]  ibuprofen (  ADVIL,MOTRIN) 600 MG tablet Take 1 tablet (600 mg total) by mouth every 6 (six) hours as needed. 09/21/17   Melynda Ripple, MD  interferon beta-1a (REBIF) 22 MCG/0.5ML injection Inject 22 mcg into the skin 3 (three) times a week. Monday Wednesday and Friday    [provider]  lovastatin (MEVACOR) 10 MG tablet Take 10 mg by mouth at bedtime.    [provider]  phenazopyridine (PYRIDIUM) 200 MG tablet Take 1 tablet (200  mg total) by mouth 3 (three) times daily as needed for pain. 10/01/19   Karen Kitchens, NP  topiramate (TOPAMAX) 50 MG tablet Take 50 mg by mouth 2 (two) times daily.  06/14/15   [provider]  sitaGLIPtin-metformin (JANUMET) 50-500 MG tablet Take 1 tablet by mouth 2 (two) times daily with a meal.  08/10/14 10/01/19  [provider]  traZODone (DESYREL) 50 MG tablet TAKE 1 TO 2 TABLETS BY MOUTH EVERY NIGHT FOR SLEEP 07/28/15 10/01/19  [provider]    Allergies Patient has no known allergies.  Family History  Problem Relation Age of Onset  . Diabetes Mother   . Breast cancer Neg Hx     Social History Social History   Tobacco Use  . Smoking status: Never Smoker  . Smokeless tobacco: Never Used  Vaping Use  . Vaping Use: Never used  Substance Use Topics  . Alcohol use: No  . Drug use: Not Currently    Review of Systems Constitutional: Positive for fever.  Eyes: No visual changes. ENT: No sore throat. Cardiovascular: Denies chest pain. Respiratory: Positive shortness of breath. Gastrointestinal: Positive for nausea and vomiting.    Genitourinary: Negative for dysuria. Musculoskeletal: Positive for body aches.  Skin: Negative for rash. Neurological: Positive for focal weakness or numbness.  ____________________________________________   PHYSICAL EXAM:  VITAL SIGNS: ED Triage Vitals  Enc Vitals Group     BP 05/25/20 1334 (!) 157/80     Pulse Rate 05/25/20 1334 (!) 110     Resp 05/25/20 1334 (!) 30     Temp 05/25/20 1334 99.1 F (37.3 C)     Temp Source 05/25/20 1334 Oral     SpO2 05/25/20 1334 (!) 84 %     Weight 05/25/20 1337 200 lb (90.7 kg)     Height 05/25/20 1337 5\' 8"  (1.727 m)     Head Circumference --      Peak Flow --      Pain Score 05/25/20 1337 8   Constitutional: Alert and oriented.  Eyes: Conjunctivae are normal.  ENT      Head: Normocephalic and atraumatic.      Nose: No congestion/rhinnorhea.      Mouth/Throat:  Mucous membranes are moist.      Neck: No stridor. Hematological/Lymphatic/Immunilogical: No cervical lymphadenopathy. Cardiovascular: Tachycardic, regular rhythm.  No murmurs, rubs, or gallops.  Respiratory: Slightly increased respiratory effort and rate.  Gastrointestinal: Soft and non tender. No rebound. No guarding.  Genitourinary: Deferred Musculoskeletal: Normal range of motion in all extremities. No lower extremity edema. Neurologic:  Normal speech and language. No gross focal neurologic deficits are appreciated.  Skin:  Skin is warm, dry and intact. No rash noted. Psychiatric: Mood and affect are normal. Speech and behavior are normal. Patient exhibits appropriate insight and judgment.  ____________________________________________    LABS (pertinent positives/negatives)  VBG pH 7.33 BMP na 138, k 3.3, glu 753, cr 1.94 CBC wbc 10.5, hgb 12.6, plt 384 Beta hydroxybutyric acid 1.62 COVID positive ____________________________________________  EKG  None ____________________________________________    RADIOLOGY  CXR Bilateral pulmonary infiltrates  ____________________________________________   PROCEDURES  Procedures  CRITICAL CARE Performed by: Nance Pear   Total critical care time: 35 minutes  Critical care time was exclusive of separately billable procedures and treating other patients.  Critical care was necessary to treat or prevent imminent or life-threatening deterioration.  Critical care was time spent personally by me on the following activities: development of treatment plan with patient and/or surrogate as well as nursing, discussions with consultants, evaluation of patient's response to treatment, examination of patient, obtaining history from patient or surrogate, ordering and performing treatments and interventions, ordering and review of laboratory studies, ordering and review of radiographic studies, pulse oximetry and re-evaluation of  patient's condition.  ____________________________________________   INITIAL IMPRESSION / ASSESSMENT AND PLAN / ED COURSE  Pertinent labs & imaging results that were available during my care of the patient were reviewed by me and considered in my medical decision making (see chart for details).   Patient presented to the emergency department today with signs and symptoms concerning for Covid.  Patient has known exposure.  Patient was also found to be significantly hyperglycemic.  Work-up however is not consistent with DKA.  Patient without anion gap or acidosis.  Will give patient IV fluids and insulin to start bringing down her glucose level.  Patient was given potassium given initial K of 3.3.  Additionally patient was ordered remdesivir and put on oxygen given hypoxia.  Hold off on steroids at this time given hyperglycemia.  Will plan on admission.  Discussed findings with patient.  ____________________________________________   FINAL CLINICAL IMPRESSION(S) / ED DIAGNOSES  Final diagnoses:  COVID-19  Hypoxia  Hyperglycemia     Note: This dictation was prepared with Dragon dictation. Any transcriptional errors that result from this process are unintentional     Nance Pear, MD 05/26/20 [phone]

## 2020-05-25 NOTE — H&P (Signed)
History and Physical    Gabriella Robinson:[id number] DOB: 1961/05/09 DOA: 05/25/2020  Referring MD/NP/PA:   PCP: Casilda Carls, MD   Patient coming from:  The patient is coming from home.  At baseline, pt is independent for most of ADL.        Chief Complaint: Shortness of breath, cough, fever, chills, nausea, vomiting, diarrhea.  HPI: Gabriella CAST is a 59 y.o. female with medical history significant of multiple sclerosis, hypertension, hyperlipidemia, diabetes mellitus, migraine headache, opioid dependence, fibromyalgia, neck pain, back pain, who presents with shortness breath, cough, fever and chills, nausea, vomiting, diarrhea.  Patient states that she has been sick for more than 5 days.  Her symptoms include cough, shortness breath, fever, chills, nausea, vomiting and some diarrhea.  Patient does not have abdominal pain.  She states that she had some chest discomfort earlier, which has resolved.  Currently no chest pain. No symptoms of UTI. She has had generalized weakness. She has had exposure to [place] in her household.  Initially her oxygen saturation 84% on room air which improved to mid 90s on 4 L oxygen, but later on desaturated again, oxygen saturation mid 80s on 6 L oxygen, HFNC started.   ED Course: pt was found to have positive Covid PCR, blood sugar 753, bicarbonate 19, anion gap 15, beta hydroxybutyric acid 1.62, urinalysis negative for UTI, but positive for ketone 5, WBC 10.5, temperature 99.1, blood pressure 157/80, heart rate 110, RR 30, Chest x-ray showed bilateral infiltration.  Patient is admitted to stepdown as inpatient.  Review of Systems:   General: has fevers, chills, no body weight gain, has poor appetite, has fatigue HEENT: no blurry vision, hearing changes or sore throat Respiratory: has dyspnea, coughing, no wheezing CV: no chest pain, no palpitations GI: has nausea, vomiting, diarrhea, no abdominal pain, constipation GU: no dysuria, burning on urination,  increased urinary frequency, hematuria  Ext: no leg edema Neuro: no unilateral weakness, numbness, or tingling, no vision change or hearing loss Skin: no rash, no skin tear. MSK: No muscle spasm, no deformity, no limitation of range of movement in spin Heme: No easy bruising.  Travel history: No recent long distant travel.  Allergy: No Known Allergies  Past Medical History:  Diagnosis Date  . Chronic back pain   . Diabetes ([place])   . HBP (high blood pressure)   . History of blood clots   . Lumbar canal stenosis 11/12/2013  . Migraines   . MS (multiple sclerosis) ([place])   . Sinus complaint   . Sleep apnea     Past Surgical History:  Procedure Laterality Date  . ABDOMINAL HYSTERECTOMY    . BACK SURGERY    . ECTOPIC PREGNANCY SURGERY    . EYE SURGERY Bilateral   . FOOT SURGERY Bilateral   . OOPHORECTOMY Left    pt has right ovary still    Social History:  reports that she has never smoked. She has never used smokeless tobacco. She reports previous drug use. She reports that she does not drink alcohol.  Family History:  Family History  Problem Relation Age of Onset  . Diabetes Mother   . Breast cancer Neg Hx      Prior to Admission medications   Medication Sig Start Date End Date Taking? Authorizing Provider  acetaminophen (TYLENOL) 500 MG tablet Take 1,000 mg by mouth every 4 (four) hours as needed for pain.    [provider]  aspirin 81 MG EC tablet Take 1  tablet (81 mg total) by mouth daily. Swallow whole. 01/09/17   Earleen Newport, MD  Cholecalciferol (VITAMIN D3) 5000 UNITS TABS Take by mouth.    [provider]  esomeprazole (NEXIUM) 40 MG capsule Take 1 capsule (40 mg total) by mouth daily. 01/09/17 10/01/19  Earleen Newport, MD  glipiZIDE (GLUCOTROL XL) 5 MG 24 hr tablet TK 1 T PO QD 07/16/15   [provider]  ibuprofen (ADVIL,MOTRIN) 600 MG tablet Take 1 tablet (600 mg total) by mouth every 6 (six) hours as needed. 09/21/17    Melynda Ripple, MD  interferon beta-1a (REBIF) 22 MCG/0.5ML injection Inject 22 mcg into the skin 3 (three) times a week. Monday Wednesday and Friday    [provider]  lovastatin (MEVACOR) 10 MG tablet Take 10 mg by mouth at bedtime.    [provider]  phenazopyridine (PYRIDIUM) 200 MG tablet Take 1 tablet (200 mg total) by mouth 3 (three) times daily as needed for pain. 10/01/19   Karen Kitchens, NP  topiramate (TOPAMAX) 50 MG tablet Take 50 mg by mouth 2 (two) times daily.  06/14/15   [provider]  sitaGLIPtin-metformin (JANUMET) 50-500 MG tablet Take 1 tablet by mouth 2 (two) times daily with a meal.  08/10/14 10/01/19  [provider]  traZODone (DESYREL) 50 MG tablet TAKE 1 TO 2 TABLETS BY MOUTH EVERY NIGHT FOR SLEEP 07/28/15 10/01/19  [provider]    Physical Exam: Vitals:   05/25/20 1550 05/25/20 1555 05/25/20 1600 05/25/20 1605  BP:    (!) 178/88  Pulse: (!) 110 (!) 107 (!) 105 (!) 103  Resp: (!) 33 (!) 21 (!) 24 19  Temp:      TempSrc:      SpO2: (!) 84% (!) 86% (!) 86% (!) 84%  Weight:      Height:       General: Not in acute distress HEENT:       Eyes: PERRL, EOMI, no scleral icterus.       ENT: No discharge from the ears and nose, no pharynx injection, no tonsillar enlargement.        Neck: No JVD, no bruit, no mass felt. Heme: No neck lymph node enlargement. Cardiac: S1/S2, RRR, No murmurs, No gallops or rubs. Respiratory: has coarse breathing sound bilaterally GI: Soft, nondistended, nontender, no rebound pain, no organomegaly, BS present. GU: No hematuria Ext: No pitting leg edema bilaterally. 1+DP/PT pulse bilaterally. Musculoskeletal: No joint deformities, No joint redness or warmth, no limitation of ROM in spin. Skin: No rashes.  Neuro: Alert, oriented X3, cranial nerves II-XII grossly intact, moves all extremities. Psych: Patient is not psychotic, no suicidal or hemocidal ideation.  Labs on Admission: I have  personally reviewed following labs and imaging studies  CBC: Recent Labs  Lab 05/25/20 1343  WBC 10.5  NEUTROABS 8.6*  HGB 12.6  HCT 38.0  MCV 85.8  PLT 161   Basic Metabolic Panel: Recent Labs  Lab 05/25/20 1343  NA 138  K 3.3*  CL 104  CO2 19*  GLUCOSE 753*  BUN 27*  CREATININE 1.94*  CALCIUM 9.1   GFR: Estimated Creatinine Clearance: 37.2 mL/min (A) (by C-G formula based on SCr of 1.94 mg/dL (H)). Liver Function Tests: No results for input(s): AST, ALT, ALKPHOS, BILITOT, PROT, ALBUMIN in the last 168 hours. No results for input(s): LIPASE, AMYLASE in the last 168 hours. No results for input(s): AMMONIA in the last 168 hours. Coagulation Profile: No results  for input(s): INR, PROTIME in the last 168 hours. Cardiac Enzymes: No results for input(s): CKTOTAL, CKMB, CKMBINDEX, TROPONINI in the last 168 hours. BNP (last 3 results) No results for input(s): PROBNP in the last 8760 hours. HbA1C: No results for input(s): HGBA1C in the last 72 hours. CBG: Recent Labs  Lab 05/25/20 1658 05/25/20 1737  GLUCAP 567* 474*   Lipid Profile: Recent Labs    05/25/20 1343  TRIG 197*   Thyroid Function Tests: No results for input(s): TSH, T4TOTAL, FREET4, T3FREE, THYROIDAB in the last 72 hours. Anemia Panel: No results for input(s): VITAMINB12, FOLATE, FERRITIN, TIBC, IRON, RETICCTPCT in the last 72 hours. Urine analysis:    Component Value Date/Time   COLORURINE YELLOW (A) 05/25/2020 1420   APPEARANCEUR CLEAR (A) 05/25/2020 1420   APPEARANCEUR Hazy 04/06/2012 1313   LABSPEC 1.028 05/25/2020 1420   LABSPEC 1.021 04/06/2012 1313   PHURINE 6.0 05/25/2020 1420   GLUCOSEU >=500 (A) 05/25/2020 1420   GLUCOSEU Negative 04/06/2012 1313   HGBUR SMALL (A) 05/25/2020 1420   BILIRUBINUR NEGATIVE 05/25/2020 1420   BILIRUBINUR Negative 04/06/2012 1313   KETONESUR 5 (A) 05/25/2020 1420   PROTEINUR 30 (A) 05/25/2020 1420   NITRITE NEGATIVE 05/25/2020 1420   LEUKOCYTESUR  NEGATIVE 05/25/2020 1420   LEUKOCYTESUR 1+ 04/06/2012 1313   Sepsis Labs: @LABRCNTIP (procalcitonin:4,lacticidven:4) ) Recent Results (from the past 240 hour(s))  Respiratory Panel by RT PCR (Flu A&B, Covid) - Nasopharyngeal Swab     Status: Abnormal   Collection Time: 05/25/20  1:43 PM   Specimen: Nasopharyngeal Swab  Result Value Ref Range Status   SARS Coronavirus 2 by RT PCR POSITIVE (A) NEGATIVE Final    Comment: RESULT CALLED TO, READ BACK BY AND VERIFIED WITH: RENO, REED AT 1459 ON 05/25/20 BY SS (NOTE) SARS-CoV-2 target nucleic acids are DETECTED.  SARS-CoV-2 RNA is generally detectable in upper respiratory specimens  during the acute phase of infection. Positive results are indicative of the presence of the identified virus, but do not rule out bacterial infection or co-infection with other pathogens not detected by the test. Clinical correlation with patient history and other diagnostic information is necessary to determine patient infection status. The expected result is Negative.  Fact Sheet for Patients:  PinkCheek.be  Fact Sheet for Healthcare Providers: GravelBags.it  This test is not yet approved or cleared by the [place] FDA and  has been authorized for detection and/or diagnosis of SARS-CoV-2 by FDA under an Emergency Use Authorization (EUA).  This EUA will remain in effect (meaning this test can b e used) for the duration of  the COVID-19 declaration under Section 564(b)(1) of the Act, 21 U.S.C. section 360bbb-3(b)(1), unless the authorization is terminated or revoked sooner.      Influenza A by PCR NEGATIVE NEGATIVE Final   Influenza B by PCR NEGATIVE NEGATIVE Final    Comment: (NOTE) The Xpert Xpress SARS-CoV-2/FLU/RSV assay is intended as an aid in  the diagnosis of influenza from Nasopharyngeal swab specimens and  should not be used as a sole basis for treatment. Nasal washings and    aspirates are unacceptable for Xpert Xpress SARS-CoV-2/FLU/RSV  testing.  Fact Sheet for Patients: PinkCheek.be  Fact Sheet for Healthcare Providers: GravelBags.it  This test is not yet approved or cleared by the [place] FDA and  has been authorized for detection and/or diagnosis of SARS-CoV-2 by  FDA under an Emergency Use Authorization (EUA). This EUA will remain  in effect (meaning this test can be used) for  the duration of the  Covid-19 declaration under Section 564(b)(1) of the Act, 21  U.S.C. section 360bbb-3(b)(1), unless the authorization is  terminated or revoked. Performed at [facility], [address] [place], [place] [address]      Radiological Exams on Admission: DG Chest Portable 1 View  Result Date: 05/25/2020 CLINICAL DATA:  Dyspnea, COVID exposure EXAM: PORTABLE CHEST 1 VIEW COMPARISON:  01/09/2017 FINDINGS: Bibasilar asymmetric airspace infiltrates are present, likely infectious or inflammatory in the acute setting. No pneumothorax or pleural effusion. Cardiac size within normal limits. Pulmonary vascularity normal. No acute bone abnormality. IMPRESSION: Moderate bilateral pulmonary infiltrates, likely infectious or inflammatory. Electronically Signed   By: Fidela Salisbury MD   On: 05/25/2020 14:15     EKG: Not done in ED, will get one.   Assessment/Plan Principal Problem:   Pneumonia due to COVID-19 virus Active Problems:   Multiple sclerosis (HCC)   HTN (hypertension)   HLD (hyperlipidemia)   Diabetes mellitus without complication ([place])   Hyperosmolar hyperglycemic state (HHS) ([place])   Hypokalemia   AKI (acute kidney injury) ([place])   Acute respiratory failure with hypoxia (HCC)   Sepsis ([place])   Acute respiratory failure with hypoxia due to pneumonia due to COVID-19 virus: Currently patient needs HFNC oxygen.  Chest x-ray showed a bilateral infiltration.  -will admit to SDU  bed as inpt -Remdesivir per pharm -Solumedrol 60 mg bid -vitamin C, zinc.  -Bronchodilators -PRN Mucinex for cough -f/u Blood culture -Gentle IV fluid -D-dimer, BNP,Trop, LFT, CRP, LDH, Procalcitonin, Ferritin, fibinogen, TG, Hep B SAg, HIV ab -Daily CRP, Ferritin, D-dimer, -Will ask the patient to maintain an awake prone position for 16+ hours a day, if possible, with a minimum of 2-3 hours at a time -Will attempt to maintain euvolemia to a net negative fluid status -Dr. Mortimer Fries of [facility] is consulted.   Sepsis due to Covid infection: Patient meets criteria for sepsis with tachycardia with heart rate 110, tachypnea with RR 30.  -will get Procalcitonin and trend lactic acid levels per sepsis protocol. -IVF: 2L of LR bolus in ED, then 50 cc/h of NS for HHS -->will not give more IVF bolus due to COVID-19 pneumonia.  Multiple sclerosis ([place]): pt is receiving interferon-Beta injection 3 times per week.  I discussed with the neurologist, Dr. Mendel Ryder by phone, who recommended to hold interferon beta -Continue home baclofen, tizanidine  HTN (hypertension): Blood pressure 157/80 -HCTZ and Cozaar due to AKI -Started amlodipine 5 mg daily -IV hydralazine as needed  HLD (hyperlipidemia) -Lipitor  Hyperosmolar hyperglycemic state (HHS): Mental status normal.  No coma.  Blood sugar 753, anion gap 15. - 2L of LR  Bolus in ED - BMP q4h - IVF: NS 50 cc/h; will switch to D5-1/2NS when CBG<250 - replete K as needed - Zofran prn nausea  - NPO   Diabetes mellitus without complication ([place]): No C5Y on record.  Patient is to take it Metformin and glipizide.  Currently has HHS with blood sugar 753 -Currently on insulin drip  Hypokalemia: K 3.3 -Patient was given 40 mEq of potassium chloride in Ed -Check magnesium level  AKI (acute kidney injury) ([place]): Likely due to dehydration and continuation of Cozaar, HCTZ, NSAIDs -Hold ibuprofen, Mobic, HCTZ, Cozaar -f/u by BMP -Avoid using renal toxic  medications -IV fluid as above      DVT ppx: SQ Lovenox Code Status: Full code Family Communication:  Yes, patient's daughter by phone Disposition Plan:  Anticipate discharge back to previous environment Consults  called:  none Admission status: SDU/inpation       Status is: Inpatient  Remains inpatient appropriate because:Inpatient level of care appropriate due to severity of illness.  Patient has some multiple comorbidities, now presents with pneumonia due to COVID-19 infection, acute respiratory failure with hypoxia, HHS with blood sugar 753, AKI, hypokalemia.  Her presentation is highly complicated.  She is at high risk of deteriorating.  Patient will need to be treated in hospital for at least 2 days.   Dispo: The patient is from: Home              Anticipated d/c is to: Home              Anticipated d/c date is: 2 days              Patient currently is not medically stable to d/c.          Date of Service 05/25/2020    Ivor Costa [facility]   If 7PM-7AM, please contact night-coverage [email or web address] 05/25/2020, 6:08 PM

## 2020-05-25 NOTE — ED Notes (Signed)
IV has not been working appropriately for IV insulin administration. New IV placed.

## 2020-05-25 NOTE — ED Notes (Signed)
Date and time results received: 05/25/20 7:30 PM  (use smartphrase ".now" to insert current time)  Test: Osmol Critical Value: 332  Name of Provider Notified: Blaine Hamper

## 2020-05-25 NOTE — ED Notes (Signed)
Date and time results received: 05/25/20 7:09 PM (use smartphrase ".now" to insert current time)  Test: Lactic Critical Value: 3.3  Name of Provider Notified: Blaine Hamper

## 2020-05-25 NOTE — ED Notes (Signed)
Lab at bedside to attempt to collect labs. Unsuccessful. Will attempt again after fluid bolus

## 2020-05-25 NOTE — ED Notes (Signed)
Pt requesting tylenol for a headache  

## 2020-05-25 NOTE — Progress Notes (Signed)
Remdesivir - Pharmacy Brief Note   O:  CXR: Moderate bilateral pulmonary infiltrates, likely infectious or inflammatory SpO2: 84% on RA   A/P:  Remdesivir 200 mg IVPB once followed by 100 mg IVPB daily x 4 days.   Sherilyn Banker, PharmD Pharmacy Resident  05/25/2020 2:44 PM

## 2020-05-26 ENCOUNTER — Other Ambulatory Visit: Payer: Self-pay

## 2020-05-26 DIAGNOSIS — U071 COVID-19: Secondary | ICD-10-CM | POA: Diagnosis not present

## 2020-05-26 DIAGNOSIS — E1100 Type 2 diabetes mellitus with hyperosmolarity without nonketotic hyperglycemic-hyperosmolar coma (NKHHC): Secondary | ICD-10-CM | POA: Diagnosis not present

## 2020-05-26 DIAGNOSIS — N179 Acute kidney failure, unspecified: Secondary | ICD-10-CM | POA: Diagnosis not present

## 2020-05-26 DIAGNOSIS — J9601 Acute respiratory failure with hypoxia: Secondary | ICD-10-CM | POA: Diagnosis not present

## 2020-05-26 LAB — CBC WITH DIFFERENTIAL/PLATELET
Abs Immature Granulocytes: 0.03 10*3/uL (ref 0.00–0.07)
Basophils Absolute: 0 10*3/uL (ref 0.0–0.1)
Basophils Relative: 0 %
Eosinophils Absolute: 0 10*3/uL (ref 0.0–0.5)
Eosinophils Relative: 0 %
HCT: 35.5 % — ABNORMAL LOW (ref 36.0–46.0)
Hemoglobin: 11.7 g/dL — ABNORMAL LOW (ref 12.0–15.0)
Immature Granulocytes: 0 %
Lymphocytes Relative: 20 %
Lymphs Abs: 1.5 10*3/uL (ref 0.7–4.0)
MCH: 28.3 pg (ref 26.0–34.0)
MCHC: 33 g/dL (ref 30.0–36.0)
MCV: 85.7 fL (ref 80.0–100.0)
Monocytes Absolute: 0.3 10*3/uL (ref 0.1–1.0)
Monocytes Relative: 5 %
Neutro Abs: 5.4 10*3/uL (ref 1.7–7.7)
Neutrophils Relative %: 75 %
Platelets: 410 10*3/uL — ABNORMAL HIGH (ref 150–400)
RBC: 4.14 MIL/uL (ref 3.87–5.11)
RDW: 13.4 % (ref 11.5–15.5)
Smear Review: NORMAL
WBC: 7.3 10*3/uL (ref 4.0–10.5)
nRBC: 0 % (ref 0.0–0.2)

## 2020-05-26 LAB — BASIC METABOLIC PANEL
Anion gap: 11 (ref 5–15)
Anion gap: 11 (ref 5–15)
Anion gap: 12 (ref 5–15)
BUN: 19 mg/dL (ref 6–20)
BUN: 19 mg/dL (ref 6–20)
BUN: 21 mg/dL — ABNORMAL HIGH (ref 6–20)
CO2: 21 mmol/L — ABNORMAL LOW (ref 22–32)
CO2: 22 mmol/L (ref 22–32)
CO2: 20 mmol/L — ABNORMAL LOW (ref 22–32)
Calcium: 9.4 mg/dL (ref 8.9–10.3)
Calcium: 9.2 mg/dL (ref 8.9–10.3)
Calcium: 9.2 mg/dL (ref 8.9–10.3)
Chloride: 115 mmol/L — ABNORMAL HIGH (ref 98–111)
Chloride: 112 mmol/L — ABNORMAL HIGH (ref 98–111)
Chloride: 109 mmol/L (ref 98–111)
Creatinine, Ser: 1.07 mg/dL — ABNORMAL HIGH (ref 0.44–1.00)
Creatinine, Ser: 0.99 mg/dL (ref 0.44–1.00)
Creatinine, Ser: 1.01 mg/dL — ABNORMAL HIGH (ref 0.44–1.00)
GFR, Estimated: 57 mL/min — ABNORMAL LOW (ref >60)
GFR, Estimated: >60 mL/min (ref >60)
GFR, Estimated: >60 mL/min (ref >60)
Glucose, Bld: 262 mg/dL — ABNORMAL HIGH (ref 70–99)
Glucose, Bld: 253 mg/dL — ABNORMAL HIGH (ref 70–99)
Glucose, Bld: 262 mg/dL — ABNORMAL HIGH (ref 70–99)
Potassium: 3.1 mmol/L — ABNORMAL LOW (ref 3.5–5.1)
Potassium: 3 mmol/L — ABNORMAL LOW (ref 3.5–5.1)
Potassium: 4.8 mmol/L (ref 3.5–5.1)
Sodium: 147 mmol/L — ABNORMAL HIGH (ref 135–145)
Sodium: 145 mmol/L (ref 135–145)
Sodium: 141 mmol/L (ref 135–145)

## 2020-05-26 LAB — COMPREHENSIVE METABOLIC PANEL
ALT: 21 U/L (ref 0–44)
AST: 24 U/L (ref 15–41)
Albumin: 3.1 g/dL — ABNORMAL LOW (ref 3.5–5.0)
Alkaline Phosphatase: 77 U/L (ref 38–126)
Anion gap: 9 (ref 5–15)
BUN: 19 mg/dL (ref 6–20)
CO2: 23 mmol/L (ref 22–32)
Calcium: 9.3 mg/dL (ref 8.9–10.3)
Chloride: 113 mmol/L — ABNORMAL HIGH (ref 98–111)
Creatinine, Ser: 0.91 mg/dL (ref 0.44–1.00)
GFR, Estimated: >60 mL/min (ref >60)
Glucose, Bld: 199 mg/dL — ABNORMAL HIGH (ref 70–99)
Potassium: 2.8 mmol/L — ABNORMAL LOW (ref 3.5–5.1)
Sodium: 145 mmol/L (ref 135–145)
Total Bilirubin: 0.5 mg/dL (ref 0.3–1.2)
Total Protein: 7.1 g/dL (ref 6.5–8.1)

## 2020-05-26 LAB — GLUCOSE, CAPILLARY
Glucose-Capillary: 234 mg/dL — ABNORMAL HIGH (ref 70–99)
Glucose-Capillary: 235 mg/dL — ABNORMAL HIGH (ref 70–99)
Glucose-Capillary: 251 mg/dL — ABNORMAL HIGH (ref 70–99)
Glucose-Capillary: 165 mg/dL — ABNORMAL HIGH (ref 70–99)
Glucose-Capillary: 151 mg/dL — ABNORMAL HIGH (ref 70–99)
Glucose-Capillary: 139 mg/dL — ABNORMAL HIGH (ref 70–99)
Glucose-Capillary: 145 mg/dL — ABNORMAL HIGH (ref 70–99)
Glucose-Capillary: 431 mg/dL — ABNORMAL HIGH (ref 70–99)
Glucose-Capillary: 479 mg/dL — ABNORMAL HIGH (ref 70–99)
Glucose-Capillary: 268 mg/dL — ABNORMAL HIGH (ref 70–99)

## 2020-05-26 LAB — FERRITIN: Ferritin: 185 ng/mL (ref 11–307)

## 2020-05-26 LAB — LACTIC ACID, PLASMA: Lactic Acid, Venous: 1.8 mmol/L (ref 0.5–1.9)

## 2020-05-26 LAB — PROCALCITONIN: Procalcitonin: 0.14 ng/mL

## 2020-05-26 LAB — FIBRIN DERIVATIVES D-DIMER (ARMC ONLY): Fibrin derivatives D-dimer (ARMC): 3274.76 ng/mL (FEU) — ABNORMAL HIGH (ref 0.00–499.00)

## 2020-05-26 LAB — TROPONIN I (HIGH SENSITIVITY): Troponin I (High Sensitivity): 13 ng/L (ref <18)

## 2020-05-26 LAB — MAGNESIUM: Magnesium: 2.2 mg/dL (ref 1.7–2.4)

## 2020-05-26 MED ORDER — INSULIN ASPART 100 UNIT/ML ~~LOC~~ SOLN
6.0000 [IU] | Freq: Three times a day (TID) | SUBCUTANEOUS | Status: DC
  Administered 2020-05-26 – 2020-05-27 (×4): 6 [IU] via SUBCUTANEOUS
  Filled 2020-05-26 (×4): qty 1

## 2020-05-26 MED ORDER — POTASSIUM CHLORIDE CRYS ER 20 MEQ PO TBCR
40.0000 meq | EXTENDED_RELEASE_TABLET | ORAL | Status: AC
  Administered 2020-05-26 (×2): 40 meq via ORAL
  Filled 2020-05-26 (×2): qty 2

## 2020-05-26 MED ORDER — LACTATED RINGERS IV SOLN: INTRAVENOUS | Status: DC

## 2020-05-26 MED ORDER — INSULIN GLARGINE 100 UNIT/ML ~~LOC~~ SOLN
10.0000 [IU] | Freq: Two times a day (BID) | SUBCUTANEOUS | Status: DC
  Administered 2020-05-26 – 2020-05-27 (×2): 10 [IU] via SUBCUTANEOUS
  Filled 2020-05-26 (×6): qty 0.1

## 2020-05-26 MED ORDER — POTASSIUM CHLORIDE CRYS ER 20 MEQ PO TBCR
40.0000 meq | EXTENDED_RELEASE_TABLET | ORAL | Status: DC
Start: 2020-05-26 — End: 2020-05-26

## 2020-05-26 MED ORDER — GABAPENTIN 600 MG PO TABS
ORAL_TABLET | ORAL | Status: AC
  Filled 2020-05-26: qty 1

## 2020-05-26 MED ORDER — GABAPENTIN 300 MG PO CAPS
ORAL_CAPSULE | ORAL | Status: AC
  Administered 2020-05-26: 800 mg via ORAL
  Filled 2020-05-26: qty 1

## 2020-05-26 MED ORDER — INSULIN GLARGINE 100 UNIT/ML ~~LOC~~ SOLN
10.0000 [IU] | SUBCUTANEOUS | Status: DC
  Administered 2020-05-26: 10 [IU] via SUBCUTANEOUS
  Filled 2020-05-26: qty 0.1

## 2020-05-26 MED ORDER — INSULIN GLARGINE 100 UNIT/ML ~~LOC~~ SOLN: 10.0000 [IU] | SUBCUTANEOUS | Status: DC

## 2020-05-26 MED ORDER — INSULIN GLARGINE 100 UNIT/ML ~~LOC~~ SOLN
10.0000 [IU] | SUBCUTANEOUS | Status: AC
  Administered 2020-05-26: 10 [IU] via SUBCUTANEOUS
  Filled 2020-05-26: qty 0.1

## 2020-05-26 MED ORDER — INSULIN ASPART 100 UNIT/ML ~~LOC~~ SOLN
0.0000 [IU] | SUBCUTANEOUS | Status: DC
  Administered 2020-05-26: 20 [IU] via SUBCUTANEOUS
  Administered 2020-05-26: 11 [IU] via SUBCUTANEOUS
  Administered 2020-05-27: 20 [IU] via SUBCUTANEOUS
  Administered 2020-05-27 (×2): 11 [IU] via SUBCUTANEOUS
  Administered 2020-05-27: 4 [IU] via SUBCUTANEOUS
  Administered 2020-05-27: 15 [IU] via SUBCUTANEOUS
  Administered 2020-05-28: 7 [IU] via SUBCUTANEOUS
  Administered 2020-05-28: 4 [IU] via SUBCUTANEOUS
  Administered 2020-05-28 (×2): 15 [IU] via SUBCUTANEOUS
  Administered 2020-05-28: 11 [IU] via SUBCUTANEOUS
  Administered 2020-05-29: 7 [IU] via SUBCUTANEOUS
  Administered 2020-05-29 (×4): 4 [IU] via SUBCUTANEOUS
  Administered 2020-05-29: 7 [IU] via SUBCUTANEOUS
  Administered 2020-05-30: 11 [IU] via SUBCUTANEOUS
  Administered 2020-05-30: 4 [IU] via SUBCUTANEOUS
  Administered 2020-05-30: 7 [IU] via SUBCUTANEOUS
  Administered 2020-05-30: 5 [IU] via SUBCUTANEOUS
  Administered 2020-05-30: 11 [IU] via SUBCUTANEOUS
  Administered 2020-05-31: 7 [IU] via SUBCUTANEOUS
  Administered 2020-05-31: 4 [IU] via SUBCUTANEOUS
  Administered 2020-05-31: 11 [IU] via SUBCUTANEOUS
  Administered 2020-05-31: 7 [IU] via SUBCUTANEOUS
  Administered 2020-06-01 (×2): 4 [IU] via SUBCUTANEOUS
  Administered 2020-06-01: 11 [IU] via SUBCUTANEOUS
  Administered 2020-06-01: 4 [IU] via SUBCUTANEOUS
  Filled 2020-05-26 (×30): qty 1

## 2020-05-26 MED ORDER — INSULIN ASPART 100 UNIT/ML ~~LOC~~ SOLN
0.0000 [IU] | Freq: Three times a day (TID) | SUBCUTANEOUS | Status: DC
  Administered 2020-05-26: 3 [IU] via SUBCUTANEOUS
  Filled 2020-05-26: qty 1

## 2020-05-26 NOTE — ED Notes (Signed)
Blood glucose 431. Per protocol, STAT lab verification is indicated. Messaged Dr Arbutus Ped to notify.

## 2020-05-26 NOTE — Progress Notes (Signed)
Note patient transitioned to SQ Insulin from the IV Insulin drip this AM  Lantus 10 units administered at 5:47am and Novolog SSi started around 10:30am.  BMET from 10:47am showed Anion Gap down to 12 and Co2 level at 20.  CBG at 12:10= 431 mg/dl--Stat lab glucose pending  MD- Since patient is getting steroids, may consider the following:  1. Recheck CBG and cover with the Current Novolog SSi that is ordered  2. Increase Lantus to 10 units BID (0.2 units/kg)--Make sure pt receives another dose of 10 units Lantus early this afternoon and then plan for the next Lantus dose for bedtime tonight  3. Increase the frequency of the Novolog SSi to Q4 hours while patient remains on IV Steroids  4. Start Novolog Meal Coverage: Novolog 6 units TID with meals  (Please add the following Hold Parameters: Hold if pt eats <50% of meal, Hold if pt NPO)     --Will follow patient during hospitalization--  Wyn Quaker RN, MSN, CDE Diabetes Coordinator Inpatient Glycemic Control Team Team Pager: [phone] (8a-5p)

## 2020-05-26 NOTE — ED Notes (Signed)
Ester on 2A given report

## 2020-05-26 NOTE — Progress Notes (Signed)
Critical Care Update Note  59yo unvaccinated female w/multiple comorbidities admitted by the hospitalist service w/COVID pneumonia and HHS (blood sugars >700 on admission). PCCM consulted 10/20.   Patient seen and examined this morning in the ED. She is alert and oriented x3 and reports feeling better today. Overall looks better. She is on 50% HFNC w/oxygen saturation of 99%. Denies any shortness of breath. Insulin gtt weaned off and now on scheduled insulin regimen per Diabetes team. PCCM will continue to follow peripherally in the event patients respiratory status were to worsen.    Tonye Royalty ACNP-BC

## 2020-05-26 NOTE — Progress Notes (Signed)
PROGRESS NOTE    Gabriella Robinson   OAC:[id number]  DOB: 1961-02-03  PCP: Casilda Carls, MD    DOA: 05/25/2020 LOS: 1   Brief Narrative   Gabriella Robinson is a 59 y.o. female with medical history of multiple sclerosis on interferon-beta, hypertension, hyperlipidemia, type 2 diabetes mellitus, migraine headache, opioid dependence, fibromyalgia, neck pain, back pain, who presented to the ED on 05/25/20 with shortness breath, cough, fever and chills, nausea, vomiting, diarrhea x 5 days after close household contact with Covid-19.  Evaluation in the ED - positive Covid-19 PCR. Initial oxygen sat 84% on room air, improved to 90's on 4 L/min.  However, by morning of 10/21, patient requiring heated HFNC at 40 L/min.  Chest xray showed multifocal infiltrates.  Pt had tachycardia, tachypnea as well, meeting criteria for sepsis due to Covid-19 pneumonia.    Initially in HHS with blood glucose of 753.  Started on insulin infusion.       Assessment & Plan   Principal Problem:   Pneumonia due to COVID-19 virus Active Problems:   Multiple sclerosis ([place])   HTN (hypertension)   HLD (hyperlipidemia)   Diabetes mellitus without complication ([place])   Hyperosmolar hyperglycemic state (HHS) ([place])   Hypokalemia   AKI (acute kidney injury) ([place])   Acute respiratory failure with hypoxia (HCC)   Sepsis ([place])    Acute respiratory failure with hypoxia due to  Multifocal pneumonia due to COVID-19 -  present on admission Patient is requiring 40 L/min oxygen by heated HFNC.  Chest x-ray showed a bilateral infiltrates consistent with PNA.  Covid-19 PCR posititve, and known household contact.  Initial CRP 9.1.   10/21: Discussed baricitinib as another potential therapy for her worsening Covid-19 pneumonia if she has no contraindications, but she declines it at this time.   --Remdesevir (10/20 >>) --Continue IV Solumedrol 60 mg bid --Procal on admission 0.14, will defer antibiotics --vitamin C,  zinc --Bronchodilators, PRN Mucinex for cough --f/u Blood culture --Stop IV fluids --follow inflammatory markers, CMP, CBC --prone or lateral decubitus positioning as much as tolerated --PCCM consulted on admission   Severe Sepsis due to Covid infection - POA, criteria for sepsis including tachycardia, tachypnea, and known Covid-19 infection. Normal lactic acid.  Acute kidney injury reflect end organ dysfunction.  Patient treated in the ED with IV fluids and started on above therapies for Covid-19 infection.  --Monitor closely due to high risk of clinical deterioration.  --Mgmt as above. --Stopping fluids this evening and monitor  Acute kidney injury - Likely due to dehydration and continuation of Cozaar, HCTZ, NSAIDs. --Hold ibuprofen, Mobic, HCTZ, Cozaar --daily BMP to monitor --Avoid nephrotoxins and hypotension --stop IV fluids and monitor   Hyperosmolar hyperglycemic state (HHS) - POA with glucose 753. Due to infection and possibly noncompliance with medication during illness.  Fluid resuscitated in the ED.  Initially on insulin drip, transitioned to subcutaneous insulin this AM (10/21). Hyperglycemia at this point exacerbated by high dose steroids for hypoxia with Covid-19 PNA. --stop IV fluids --insulin as below --resumed on diet  Type 2 diabetes mellitus - No prior A1c on record.  Takes Metformin and glipizide at home.  Presented in HHS with blood sugar 753.  Remains hyperglycemic due to steroids.   --Diabetes coordinator following --Lantus 10 units BID, with 10 units "now" this afternoon --Novolog 6 units TID WC + resistant sliding scale --Hbg A1c is pending  Hypokalemia - K 3.3 on admission, replaced in ED.  Monitor K and Mg, replace  as needed    Multiple sclerosis - on interferon-Beta injection 3 times per week.  Dr. Blaine Hamper, admitting hospitalist, discussed with patient's neurologist, Dr. Mendel Ryder by phone, recommended to hold interferon beta.   --Continue home  baclofen, tizanidine  Hypertension - blood pressure 157/80 on admission.  BP has been very labile today (10/21).   --Home HCTZ and losartan are held due to AKI --Started on amlodipine on admission with other BP meds held --Hydralazine PRN  Hyperlipidemia - continue Lipitor   Obesity: Body mass index is 30.41 kg/m.  Complicates overall care and prognosis.  DVT prophylaxis: enoxaparin (LOVENOX) injection 40 mg Start: 05/25/20 2200   Diet:  Diet Orders (From admission, onward)    Start     Ordered   05/26/20 0417  Diet heart healthy/carb modified Room service appropriate? Yes; Fluid consistency: Thin  Diet effective now       Question Answer Comment  Diet-HS Snack? Nothing   Room service appropriate? Yes   Fluid consistency: Thin      05/26/20 0416            Code Status: Full Code    Subjective 05/26/20    Pt seen this AM in ED holding for a bed.  On heated HFNC.  Says she's feeling okay, about the same.  Is hungry and requesting breakfast.  We discussed baricitinib as another potential therapy for her worsening Covid-19 pneumonia if not contraindications, but she declines it.     Disposition Plan & Communication   Status is: Inpatient  Remains inpatient appropriate because:Inpatient level of care appropriate due to severity of illness   Dispo: The patient is from: Home              Anticipated d/c is to: Home              Anticipated d/c date is: 3 days              Patient currently is not medically stable to d/c.     Family Communication: spoke with daughter, [name], this afternoon by phone 10/21.    Consults, Procedures, Significant Events   Consultants:   none  Procedures:   none  Antimicrobials:  Anti-infectives (From admission, onward)   Start     Dose/Rate Route Frequency Ordered Stop   05/26/20 1000  remdesivir 100 mg in sodium chloride 0.9 % 100 mL IVPB       "Followed by" Linked Group Details   100 mg 200 mL/hr over 30 Minutes  Intravenous Daily 05/25/20 1441 05/30/20 0959   05/25/20 1700  remdesivir 200 mg in sodium chloride 0.9% 250 mL IVPB       "Followed by" Linked Group Details   200 mg 580 mL/hr over 30 Minutes Intravenous Once 05/25/20 1441 05/25/20 1630         Objective   Vitals:   05/26/20 1430 05/26/20 1500 05/26/20 1530 05/26/20 1631  BP: 121/81 125/80 136/83 (!) 150/99  Pulse:   (!) 101 98  Resp: (!) 23 18 19 20   Temp:    98.3 F (36.8 C)  TempSrc:    Oral  SpO2:   92% 91%  Weight:      Height:        Intake/Output Summary (Last 24 hours) at 05/26/2020 1643 Last data filed at 05/26/2020 0832 Gross per 24 hour  Intake 1457.87 ml  Output 800 ml  Net 657.87 ml   Filed Weights   05/25/20 1337  Weight: 90.7 kg  Physical Exam:  General exam: awake, alert, no acute distress, obese Respiratory system: CTAB with diminished bases, no wheezes or rhonchi, normal respiratory effort. Cardiovascular system: normal S1/S2, RRR, no pedal edema.   Gastrointestinal system: soft, NT, ND, +bowel sounds. Central nervous system: A&O x3. no gross focal neurologic deficits, normal speech Extremities: moves all, no edema, normal tone Psychiatry: normal mood, congruent affect, judgement and insight appear normal  Labs   Data Reviewed: I have personally reviewed following labs and imaging studies  CBC: Recent Labs  Lab 05/25/20 1343 05/26/20 0431  WBC 10.5 7.3  NEUTROABS 8.6* 5.4  HGB 12.6 11.7*  HCT 38.0 35.5*  MCV 85.8 85.7  PLT 384 650*   Basic Metabolic Panel: Recent Labs  Lab 05/25/20 1807 05/25/20 1807 05/25/20 2006 05/26/20 0041 05/26/20 0311 05/26/20 0431 05/26/20 1047  NA 143   < > 147* 147* 145 145 141  K 3.2*   < > 3.7 3.1* 3.0* 2.8* 4.8  CL 108   < > 115* 115* 112* 113* 109  CO2 20*   < > 19* 21* 22 23 20*  GLUCOSE 476*   < > 271* 262* 253* 199* 262*  BUN 22*   < > 18 19 19 19  21*  CREATININE 1.51*   < > 1.18* 1.07* 0.99 0.91 1.01*  CALCIUM 9.3   < > 8.7* 9.4 9.2  9.3 9.2  MG 2.2  --   --   --   --  2.2  --    < > = values in this interval not displayed.   GFR: Estimated Creatinine Clearance: 71.5 mL/min (A) (by C-G formula based on SCr of 1.01 mg/dL (H)). Liver Function Tests: Recent Labs  Lab 05/25/20 1807 05/26/20 0431  AST 30 24  ALT 23 21  ALKPHOS 80 77  BILITOT 0.9 0.5  PROT 8.0 7.1  ALBUMIN 3.6 3.1*   No results for input(s): LIPASE, AMYLASE in the last 168 hours. No results for input(s): AMMONIA in the last 168 hours. Coagulation Profile: No results for input(s): INR, PROTIME in the last 168 hours. Cardiac Enzymes: No results for input(s): CKTOTAL, CKMB, CKMBINDEX, TROPONINI in the last 168 hours. BNP (last 3 results) No results for input(s): PROBNP in the last 8760 hours. HbA1C: No results for input(s): HGBA1C in the last 72 hours. CBG: Recent Labs  Lab 05/26/20 0545 05/26/20 0642 05/26/20 0831 05/26/20 1210 05/26/20 1633  GLUCAP 151* 139* 145* 431* 479*   Lipid Profile: Recent Labs    05/25/20 1343  TRIG 197*   Thyroid Function Tests: No results for input(s): TSH, T4TOTAL, FREET4, T3FREE, THYROIDAB in the last 72 hours. Anemia Panel: Recent Labs    05/25/20 1807 05/26/20 0431  FERRITIN 245 185   Sepsis Labs: Recent Labs  Lab 05/25/20 1807 05/26/20 0041 05/26/20 0431  PROCALCITON 0.21  --  0.14  LATICACIDVEN 3.3* 1.8  --     Recent Results (from the past 240 hour(s))  Respiratory Panel by RT PCR (Flu A&B, Covid) - Nasopharyngeal Swab     Status: Abnormal   Collection Time: 05/25/20  1:43 PM   Specimen: Nasopharyngeal Swab  Result Value Ref Range Status   SARS Coronavirus 2 by RT PCR POSITIVE (A) NEGATIVE Final    Comment: RESULT CALLED TO, READ BACK BY AND VERIFIED WITH: RENO, REED AT 1459 ON 05/25/20 BY SS (NOTE) SARS-CoV-2 target nucleic acids are DETECTED.  SARS-CoV-2 RNA is generally detectable in upper respiratory specimens  during the acute phase of  infection. Positive results are  indicative of the presence of the identified virus, but do not rule out bacterial infection or co-infection with other pathogens not detected by the test. Clinical correlation with patient history and other diagnostic information is necessary to determine patient infection status. The expected result is Negative.  Fact Sheet for Patients:  PinkCheek.be  Fact Sheet for Healthcare Providers: GravelBags.it  This test is not yet approved or cleared by the [place] FDA and  has been authorized for detection and/or diagnosis of SARS-CoV-2 by FDA under an Emergency Use Authorization (EUA).  This EUA will remain in effect (meaning this test can b e used) for the duration of  the COVID-19 declaration under Section 564(b)(1) of the Act, 21 U.S.C. section 360bbb-3(b)(1), unless the authorization is terminated or revoked sooner.      Influenza A by PCR NEGATIVE NEGATIVE Final   Influenza B by PCR NEGATIVE NEGATIVE Final    Comment: (NOTE) The Xpert Xpress SARS-CoV-2/FLU/RSV assay is intended as an aid in  the diagnosis of influenza from Nasopharyngeal swab specimens and  should not be used as a sole basis for treatment. Nasal washings and  aspirates are unacceptable for Xpert Xpress SARS-CoV-2/FLU/RSV  testing.  Fact Sheet for Patients: PinkCheek.be  Fact Sheet for Healthcare Providers: GravelBags.it  This test is not yet approved or cleared by the [place] FDA and  has been authorized for detection and/or diagnosis of SARS-CoV-2 by  FDA under an Emergency Use Authorization (EUA). This EUA will remain  in effect (meaning this test can be used) for the duration of the  Covid-19 declaration under Section 564(b)(1) of the Act, 21  U.S.C. section 360bbb-3(b)(1), unless the authorization is  terminated or revoked. Performed at [facility], [address] [place], [place] [address]   Culture, blood (Routine X 2) w Reflex to ID Panel     Status: None (Preliminary result)   Collection Time: 05/26/20 12:43 AM   Specimen: BLOOD  Result Value Ref Range Status   Specimen Description BLOOD BLOOD LEFT ARM  Final   Special Requests   Final    BOTTLES DRAWN AEROBIC AND ANAEROBIC Blood Culture results may not be optimal due to an excessive volume of blood received in culture bottles   Culture   Final    NO GROWTH < 12 HOURS Performed at [facility], [address], [place], [place] [address]    Report Status PENDING  Incomplete  Culture, blood (Routine X 2) w Reflex to ID Panel     Status: None (Preliminary result)   Collection Time: 05/26/20 12:43 AM   Specimen: BLOOD  Result Value Ref Range Status   Specimen Description BLOOD BLOOD RIGHT ARM  Final   Special Requests   Final    BOTTLES DRAWN AEROBIC AND ANAEROBIC Blood Culture results may not be optimal due to an excessive volume of blood received in culture bottles   Culture   Final    NO GROWTH < 12 HOURS Performed at [facility], [address] [place], [place] [address]    Report Status PENDING  Incomplete      Imaging Studies   DG Chest Portable 1 View  Result Date: 05/25/2020 CLINICAL DATA:  Dyspnea, COVID exposure EXAM: PORTABLE CHEST 1 VIEW COMPARISON:  01/09/2017 FINDINGS: Bibasilar asymmetric airspace infiltrates are present, likely infectious or inflammatory in the acute setting. No pneumothorax or pleural effusion. Cardiac size within normal limits. Pulmonary vascularity normal. No acute bone abnormality.  IMPRESSION: Moderate bilateral pulmonary infiltrates, likely infectious or inflammatory. Electronically Signed   By: Fidela Salisbury MD   On: 05/25/2020 14:15     Medications   Scheduled Meds: . amLODipine  5 mg Oral Daily  . vitamin C  500 mg Oral Daily  . aspirin EC  81 mg Oral Daily  . atorvastatin  10 mg Oral Daily  . baclofen  5 mg Oral QHS   . enoxaparin (LOVENOX) injection  40 mg Subcutaneous Q24H  . gabapentin  800 mg Oral BID  . insulin aspart  0-20 Units Subcutaneous Q4H  . insulin aspart  6 Units Subcutaneous TID WC  . insulin glargine  10 Units Subcutaneous BID  . ipratropium  2 puff Inhalation Q4H  . methylPREDNISolone (SOLU-MEDROL) injection  60 mg Intravenous Q12H  . tiZANidine  4 mg Oral TID  . topiramate  100 mg Oral BID  . zinc sulfate  220 mg Oral Daily   Continuous Infusions: . sodium chloride Stopped (05/26/20 0237)  . dextrose 5% lactated ringers Stopped (05/26/20 0832)  . insulin Stopped (05/26/20 1115)  . lactated ringers 100 mL/hr at 05/26/20 0836  . remdesivir 100 mg in NS 100 mL Stopped (05/26/20 1200)       LOS: 1 day    Time spent: 35 minutes with > 50% spent in coordination of care and direct patient contact.     Ezekiel Slocumb, DO [facility]  05/26/2020, 4:43 PM    If 7PM-7AM, please contact night-coverage. How to contact the [facility] Attending or Consulting provider [place] or covering provider during after hours [place], for this patient?    1. Check the care team in [facility] and look for a) attending/consulting TR[facility] provider listed and b) the [facility] team listed 2. Log into [email or web address] and use [place]'s universal password to access. If you do not have the password, please contact the hospital operator. 3. Locate the [facility] provider you are looking for under [facility] and page to a number that you can be directly reached. 4. If you still have difficulty reaching the provider, please page the [facility] (Director on Call) for the Hospitalists listed on amion for assistance.

## 2020-05-26 NOTE — Hospital Course (Addendum)
Gabriella Robinson is a 59 y.o. female with medical history of multiple sclerosis on interferon-beta, hypertension, hyperlipidemia, type 2 diabetes mellitus, migraine headache, opioid dependence, fibromyalgia, neck pain, back pain, who presented to the ED on 05/25/20 with shortness breath, cough, fever and chills, nausea, vomiting, diarrhea x 5 days after close household contact with Covid-19.  Evaluation in the ED - positive Covid-19 PCR. Initial oxygen sat 84% on room air, improved to 90's on 4 L/min.  However, by morning of 10/21, patient requiring heated HFNC at 40 L/min.  Chest xray showed multifocal infiltrates.  Pt had tachycardia, tachypnea as well, meeting criteria for sepsis due to Covid-19 pneumonia.    Initially in HHS with blood glucose of 753.  Started on insulin infusion.  Transitioned to subcutaneous insulin the next morning.    10/21-22: continues to require 40 L/min heated HF.  Hyperglycemic on high dose IV steroids, insulin increased.

## 2020-05-26 NOTE — Progress Notes (Signed)
Inpatient Diabetes Program Recommendations  AACE/ADA: New Consensus Statement on Inpatient Glycemic Control (2015)  Target Ranges:  Prepandial:   less than 140 mg/dL      Peak postprandial:   less than 180 mg/dL (1-2 hours)      Critically ill patients:  140 - 180 mg/dL   Results for Gabriella Robinson, Gabriella Robinson (MRN [id number]) as of 05/26/2020 07:13  Ref. Range 05/25/2020 13:43  Glucose Latest Ref Range: 70 - 99 mg/dL 753 [facility])   Results for Gabriella Robinson, Gabriella Robinson (MRN [id number]) as of 05/26/2020 07:13  Ref. Range 05/25/2020 16:58 05/25/2020 17:37 05/25/2020 18:19 05/25/2020 18:52 05/25/2020 19:56 05/25/2020 20:56 05/25/2020 22:24 05/25/2020 23:38 05/26/2020 00:55 05/26/2020 02:07 05/26/2020 03:14 05/26/2020 04:39 05/26/2020 05:45 05/26/2020 06:42  Glucose-Capillary Latest Ref Range: 70 - 99 mg/dL 567 (HH)  IV Insulin Drip Started 474 (H)  IV Insulin Drip 438 (H)  IV Insulin Drip 382 (H)  IV Insulin Drip 258 (H)  IV Insulin Drip 263 (H)  IV Insulin Drip 309 (H)  IV Insulin Drip 312 (H)  IV Insulin Drip 234 (H)  IV Insulin Drip 235 (H)  IV Insulin Drip 251 (H)  IV Insulin Drip 165 (H)  IV Insulin Drip 151 (H)  IV Insulin Drip +  10 units LANTUS 139 (H)  IV Insulin Drip    Admit with: DKA/ COVID+  History: DM, MS  Home DM Meds: Metformin 1000 mg BID       Glipizide 5 mg Daily  Current Orders: Lantus 10 units Q24 hours      Novolog Resistant Correction Scale/ SSI (0-20 units) TID AC + HS     Patient currently receiving Solumedrol 60 mg BID  Current A1c pending  Transitioning to SQ Insulin this AM    --Will follow patient during hospitalization--  Wyn Quaker RN, MSN, CDE Diabetes Coordinator Inpatient Glycemic Control Team Team Pager: [phone] (8a-5p)

## 2020-05-26 NOTE — ED Notes (Signed)
Pt has been repositioned and new oxygen saturation probe placed. Pt continues to be restless and SOB with hypoxia. Respiratory made aware after pt trialed on 5L oxygen via [place].

## 2020-05-26 NOTE — ED Notes (Signed)
Pt given meal tray.

## 2020-05-27 DIAGNOSIS — U071 COVID-19: Secondary | ICD-10-CM | POA: Diagnosis not present

## 2020-05-27 DIAGNOSIS — E876 Hypokalemia: Secondary | ICD-10-CM | POA: Diagnosis not present

## 2020-05-27 DIAGNOSIS — E119 Type 2 diabetes mellitus without complications: Secondary | ICD-10-CM | POA: Diagnosis not present

## 2020-05-27 DIAGNOSIS — J9601 Acute respiratory failure with hypoxia: Secondary | ICD-10-CM | POA: Diagnosis not present

## 2020-05-27 LAB — COMPREHENSIVE METABOLIC PANEL
ALT: 20 U/L (ref 0–44)
AST: 24 U/L (ref 15–41)
Albumin: 2.8 g/dL — ABNORMAL LOW (ref 3.5–5.0)
Alkaline Phosphatase: 65 U/L (ref 38–126)
Anion gap: 10 (ref 5–15)
BUN: 23 mg/dL — ABNORMAL HIGH (ref 6–20)
CO2: 23 mmol/L (ref 22–32)
Calcium: 9.1 mg/dL (ref 8.9–10.3)
Chloride: 107 mmol/L (ref 98–111)
Creatinine, Ser: 0.74 mg/dL (ref 0.44–1.00)
GFR, Estimated: >60 mL/min (ref >60)
Glucose, Bld: 188 mg/dL — ABNORMAL HIGH (ref 70–99)
Potassium: 3.1 mmol/L — ABNORMAL LOW (ref 3.5–5.1)
Sodium: 140 mmol/L (ref 135–145)
Total Bilirubin: 0.9 mg/dL (ref 0.3–1.2)
Total Protein: 6.5 g/dL (ref 6.5–8.1)

## 2020-05-27 LAB — FIBRIN DERIVATIVES D-DIMER (ARMC ONLY): Fibrin derivatives D-dimer (ARMC): 3443.63 ng/mL (FEU) — ABNORMAL HIGH (ref 0.00–499.00)

## 2020-05-27 LAB — CBC WITH DIFFERENTIAL/PLATELET
Abs Immature Granulocytes: 0.05 10*3/uL (ref 0.00–0.07)
Basophils Absolute: 0 10*3/uL (ref 0.0–0.1)
Basophils Relative: 0 %
Eosinophils Absolute: 0.1 10*3/uL (ref 0.0–0.5)
Eosinophils Relative: 1 %
HCT: 33.3 % — ABNORMAL LOW (ref 36.0–46.0)
Hemoglobin: 11.2 g/dL — ABNORMAL LOW (ref 12.0–15.0)
Immature Granulocytes: 1 %
Lymphocytes Relative: 22 %
Lymphs Abs: 2.1 10*3/uL (ref 0.7–4.0)
MCH: 29.1 pg (ref 26.0–34.0)
MCHC: 33.6 g/dL (ref 30.0–36.0)
MCV: 86.5 fL (ref 80.0–100.0)
Monocytes Absolute: 0.6 10*3/uL (ref 0.1–1.0)
Monocytes Relative: 6 %
Neutro Abs: 6.9 10*3/uL (ref 1.7–7.7)
Neutrophils Relative %: 70 %
Platelets: 422 10*3/uL — ABNORMAL HIGH (ref 150–400)
RBC: 3.85 MIL/uL — ABNORMAL LOW (ref 3.87–5.11)
RDW: 14 % (ref 11.5–15.5)
WBC: 9.7 10*3/uL (ref 4.0–10.5)
nRBC: 0 % (ref 0.0–0.2)

## 2020-05-27 LAB — MAGNESIUM: Magnesium: 1.9 mg/dL (ref 1.7–2.4)

## 2020-05-27 LAB — C-REACTIVE PROTEIN: CRP: 2.8 mg/dL — ABNORMAL HIGH (ref <1.0)

## 2020-05-27 LAB — GLUCOSE, CAPILLARY
Glucose-Capillary: 170 mg/dL — ABNORMAL HIGH (ref 70–99)
Glucose-Capillary: 184 mg/dL — ABNORMAL HIGH (ref 70–99)
Glucose-Capillary: 257 mg/dL — ABNORMAL HIGH (ref 70–99)
Glucose-Capillary: 297 mg/dL — ABNORMAL HIGH (ref 70–99)
Glucose-Capillary: 320 mg/dL — ABNORMAL HIGH (ref 70–99)
Glucose-Capillary: 373 mg/dL — ABNORMAL HIGH (ref 70–99)
Glucose-Capillary: 58 mg/dL — ABNORMAL LOW (ref 70–99)

## 2020-05-27 LAB — FERRITIN: Ferritin: 134 ng/mL (ref 11–307)

## 2020-05-27 LAB — PROCALCITONIN: Procalcitonin: <0.1 ng/mL

## 2020-05-27 LAB — HEMOGLOBIN A1C
Hgb A1c MFr Bld: 11 % — ABNORMAL HIGH (ref 4.8–5.6)
Mean Plasma Glucose: 269 mg/dL

## 2020-05-27 MED ORDER — INSULIN GLARGINE 100 UNIT/ML ~~LOC~~ SOLN
15.0000 [IU] | Freq: Two times a day (BID) | SUBCUTANEOUS | Status: DC
  Administered 2020-05-27 – 2020-05-30 (×6): 15 [IU] via SUBCUTANEOUS
  Filled 2020-05-27 (×7): qty 0.15

## 2020-05-27 MED ORDER — INSULIN ASPART 100 UNIT/ML ~~LOC~~ SOLN: 8.0000 [IU] | Freq: Three times a day (TID) | SUBCUTANEOUS | Status: DC

## 2020-05-27 MED ORDER — LOSARTAN POTASSIUM 50 MG PO TABS
50.0000 mg | ORAL_TABLET | Freq: Every day | ORAL | Status: DC
  Administered 2020-05-28 – 2020-06-06 (×10): 50 mg via ORAL
  Filled 2020-05-27 (×10): qty 1

## 2020-05-27 MED ORDER — GUAIFENESIN-DM 100-10 MG/5ML PO SYRP
5.0000 mL | ORAL_SOLUTION | ORAL | Status: DC | PRN
  Administered 2020-05-27: 5 mL via ORAL
  Filled 2020-05-27: qty 5

## 2020-05-27 MED ORDER — DM-GUAIFENESIN ER 30-600 MG PO TB12
1.0000 | ORAL_TABLET | Freq: Two times a day (BID) | ORAL | Status: DC
  Administered 2020-05-27 – 2020-06-06 (×20): 1 via ORAL
  Filled 2020-05-27 (×20): qty 1

## 2020-05-27 MED ORDER — POTASSIUM CHLORIDE CRYS ER 20 MEQ PO TBCR
40.0000 meq | EXTENDED_RELEASE_TABLET | ORAL | Status: AC
  Administered 2020-05-27 (×2): 40 meq via ORAL
  Filled 2020-05-27 (×2): qty 2

## 2020-05-27 NOTE — Care Management Important Message (Signed)
Important Message  Patient Details  Name: Gabriella Robinson MRN: [id number] Date of Birth: 06-May-1961   Medicare Important Message Given:  N/A - LOS <3 / Initial given by admissions  Initial Medicare IM reviewed with Gayla Medicus, daughter by Meredith Mody, Patient Access Associate on 05/27/2020 at 10:52am.    Dannette Barbara 05/27/2020, 12:21 PM

## 2020-05-27 NOTE — Progress Notes (Addendum)
PROGRESS NOTE    Gabriella Robinson   BJS:[id number]  DOB: [birth year]  PCP: Casilda Carls, MD    DOA: 05/25/2020 LOS: 2   Brief Narrative   Gabriella Robinson is a 59 y.o. female with medical history of multiple sclerosis on interferon-beta, hypertension, hyperlipidemia, type 2 diabetes mellitus, migraine headache, opioid dependence, fibromyalgia, neck pain, back pain, who presented to the ED on 05/25/20 with shortness breath, cough, fever and chills, nausea, vomiting, diarrhea x 5 days after close household contact with Covid-19.  Evaluation in the ED - positive Covid-19 PCR. Initial oxygen sat 84% on room air, improved to 90's on 4 L/min.  However, by morning of 10/21, patient requiring heated HFNC at 40 L/min.  Chest xray showed multifocal infiltrates.  Pt had tachycardia, tachypnea as well, meeting criteria for sepsis due to Covid-19 pneumonia.    Initially in HHS with blood glucose of 753.  Started on insulin infusion.       Assessment & Plan   Principal Problem:   Pneumonia due to COVID-19 virus Active Problems:   Hyperosmolar hyperglycemic state (HHS) ([place])   Acute respiratory failure with hypoxia (HCC)   Severe sepsis (HCC)   Diabetes mellitus without complication (HCC)   Hypokalemia   AKI (acute kidney injury) ([place])   HTN (hypertension)   HLD (hyperlipidemia)   Multiple sclerosis (HCC)    Acute respiratory failure with hypoxia due to  Multifocal pneumonia due to COVID-19 -  present on admission Patient is requiring 40 L/min oxygen by heated HFNC.  Chest x-ray showed a bilateral infiltrates consistent with PNA.  Covid-19 PCR posititve, and known household contact.  Initial CRP 9.1.   10/21: Discussed baricitinib as another potential therapy for her worsening Covid-19 pneumonia if she has no contraindications, but she declines it at this time.   10/22: pt feeling worse today, headache, persistent cough, hot.  Had a fever of 100.4 this morning.  CRP improved  9.1>>2.8. --Remdesevir (10/20 >>) --Continue IV Solumedrol 60 mg bid --Procal on admission 0.14, will defer antibiotics --vitamin C, zinc --Bronchodilators, Antitussives --f/u Blood culture --follow inflammatory markers, CMP, CBC --prone or lateral decubitus positioning as much as tolerated   Severe Sepsis due to Covid infection - POA, criteria for sepsis including tachycardia, tachypnea, and known Covid-19 infection. Normal lactic acid.  Acute kidney injury reflect end organ dysfunction.  Patient treated in the ED with IV fluids and started on above therapies for Covid-19 infection.  --Monitor closely due to high risk of clinical deterioration.  --Mgmt as above. --Stopping fluids this evening and monitor  Lactic acidosis - present on admission, due to sepsis. Resolved.  Acute kidney injury - Likely due to dehydration and continuation of Cozaar, HCTZ, NSAIDs.  Resolved with IV hydration. --Hold ibuprofen, Mobic --Resume Cozaar, monitor BP and resume HCTZ tomorrow if indicated --daily BMP to monitor --Avoid nephrotoxins and hypotension --off IV fluids and stable Cr  Hyperosmolar hyperglycemic state (HHS) - POA with glucose 753. Due to infection and possibly noncompliance with medication during illness.  Fluid resuscitated in the ED.  Initially on insulin drip, transitioned to subcutaneous insulin this AM (10/21). Hyperglycemia at this point exacerbated by high dose steroids for hypoxia with Covid-19 PNA. --insulin as below --resumed on diet   Steroid-induced hyperglycemia -  Type 2 diabetes mellitus - Uncontrolled, HbgA1c is 11%.  Takes Metformin and glipizide at home.  Presented in HHS with blood sugar 753.  Remains hyperglycemic due to steroids.   --Diabetes coordinator following --increase Lantus  to 15 units BID --increase mealtime Novolog to 8 units TID WC  --continue resistant sliding scale --Hbg A1c is pending   Hypokalemia - K 3.3 on admission, replaced in ED.  Monitor K  and Mg, replace as needed     Multiple sclerosis - on interferon-Beta injection 3 times per week.  Dr. Blaine Hamper, admitting hospitalist, discussed with patient's neurologist, Dr. Mendel Ryder by phone, recommended to hold interferon beta.   --Continue home baclofen, tizanidine   Hypertension - blood pressure 157/80 on admission.  BP has been very labile today (10/21).   --Resume losartan since AKI resolved --Continue holding HCTZ for now given low K.  Can stop amlodipine when HCTZ resumed. --Started on amlodipine on admission with other BP meds held --Hydralazine PRN   Hyperlipidemia - continue Lipitor    Obesity: Body mass index is 31.85 kg/m.  Complicates overall care and prognosis.  DVT prophylaxis: enoxaparin (LOVENOX) injection 40 mg Start: 05/25/20 2200   Diet:  Diet Orders (From admission, onward)     Start     Ordered   05/26/20 0417  Diet heart healthy/carb modified Room service appropriate? Yes; Fluid consistency: Thin  Diet effective now       Question Answer Comment  Diet-HS Snack? Nothing   Room service appropriate? Yes   Fluid consistency: Thin      05/26/20 0416              Code Status: Full Code    Subjective 05/27/20    Pt seen this AM at bedside.  She reports feeling worse today.  Has a headache, productive cough has been persistent.  Feels hot and did spike a fever earlier this AM.     Disposition Plan & Communication   Status is: Inpatient  Remains inpatient appropriate because:Inpatient level of care appropriate due to severity of illness   Dispo: The patient is from: Home              Anticipated d/c is to: Home              Anticipated d/c date is: 3 days              Patient currently is not medically stable to d/c.     Family Communication: spoke with daughter, [name], by phone 10/21.   Will attempt to call this afternoon.   Consults, Procedures, Significant Events   Consultants:  none  Procedures:  none  Antimicrobials:   Anti-infectives (From admission, onward)    Start     Dose/Rate Route Frequency Ordered Stop   05/26/20 1000  remdesivir 100 mg in sodium chloride 0.9 % 100 mL IVPB       "Followed by" Linked Group Details   100 mg 200 mL/hr over 30 Minutes Intravenous Daily 05/25/20 1441 05/30/20 0959   05/25/20 1700  remdesivir 200 mg in sodium chloride 0.9% 250 mL IVPB       "Followed by" Linked Group Details   200 mg 580 mL/hr over 30 Minutes Intravenous Once 05/25/20 1441 05/25/20 1630          Objective   Vitals:   05/27/20 0853 05/27/20 0902 05/27/20 1220 05/27/20 1646  BP: (!) 155/92  130/81 (!) 146/80  Pulse: (!) 102  88 98  Resp: 18  18 20   Temp: (!) 100.4 F (38 C)  97.9 F (36.6 C) 99.1 F (37.3 C)  TempSrc: Oral     SpO2: 94% 94% 94% 100%  Weight:  Height:        Intake/Output Summary (Last 24 hours) at 05/27/2020 1848 Last data filed at 05/27/2020 1805 Gross per 24 hour  Intake 100 ml  Output 2050 ml  Net -1950 ml   Filed Weights   05/25/20 1337 05/26/20 1631  Weight: 90.7 kg 95 kg    Physical Exam:  General exam: awake, alert, ill-appearing, no acute distress, obese Respiratory system: rhonchi, no wheezes, mildly increased respiratory effort, on heated HFNC at 40 L/min. Cardiovascular system: normal S1/S2, RRR, no pedal edema.   Central nervous system: A&O x3. no gross focal neurologic deficits, normal speech Extremities: moves all, no edema, normal tone Psychiatry: normal mood, congruent affect, judgement and insight appear normal  Labs   Data Reviewed: I have personally reviewed following labs and imaging studies  CBC: Recent Labs  Lab 05/25/20 1343 05/26/20 0431 05/27/20 0622  WBC 10.5 7.3 9.7  NEUTROABS 8.6* 5.4 6.9  HGB 12.6 11.7* 11.2*  HCT 38.0 35.5* 33.3*  MCV 85.8 85.7 86.5  PLT 384 410* 188*   Basic Metabolic Panel: Recent Labs  Lab 05/25/20 1807 05/25/20 2006 05/26/20 0041 05/26/20 0311 05/26/20 0431 05/26/20 1047  05/27/20 0622  NA 143   < > 147* 145 145 141 140  K 3.2*   < > 3.1* 3.0* 2.8* 4.8 3.1*  CL 108   < > 115* 112* 113* 109 107  CO2 20*   < > 21* 22 23 20* 23  GLUCOSE 476*   < > 262* 253* 199* 262* 188*  BUN 22*   < > 19 19 19  21* 23*  CREATININE 1.51*   < > 1.07* 0.99 0.91 1.01* 0.74  CALCIUM 9.3   < > 9.4 9.2 9.3 9.2 9.1  MG 2.2  --   --   --  2.2  --  1.9   < > = values in this interval not displayed.   GFR: Estimated Creatinine Clearance: 92.3 mL/min (by C-G formula based on SCr of 0.74 mg/dL). Liver Function Tests: Recent Labs  Lab 05/25/20 1807 05/26/20 0431 05/27/20 0622  AST 30 24 24   ALT 23 21 20   ALKPHOS 80 77 65  BILITOT 0.9 0.5 0.9  PROT 8.0 7.1 6.5  ALBUMIN 3.6 3.1* 2.8*   No results for input(s): LIPASE, AMYLASE in the last 168 hours. No results for input(s): AMMONIA in the last 168 hours. Coagulation Profile: No results for input(s): INR, PROTIME in the last 168 hours. Cardiac Enzymes: No results for input(s): CKTOTAL, CKMB, CKMBINDEX, TROPONINI in the last 168 hours. BNP (last 3 results) No results for input(s): PROBNP in the last 8760 hours. HbA1C: Recent Labs    05/25/20 1807  HGBA1C 11.0*   CBG: Recent Labs  Lab 05/27/20 0512 05/27/20 0547 05/27/20 0850 05/27/20 1220 05/27/20 1645  GLUCAP 58* 184* 297* 373* 320*   Lipid Profile: Recent Labs    05/25/20 1343  TRIG 197*   Thyroid Function Tests: No results for input(s): TSH, T4TOTAL, FREET4, T3FREE, THYROIDAB in the last 72 hours. Anemia Panel: Recent Labs    05/26/20 0431 05/27/20 0622  FERRITIN 185 134   Sepsis Labs: Recent Labs  Lab 05/25/20 1807 05/26/20 0041 05/26/20 0431 05/27/20 0622  PROCALCITON 0.21  --  0.14 <0.10  LATICACIDVEN 3.3* 1.8  --   --     Recent Results (from the past 240 hour(s))  Respiratory Panel by RT PCR (Flu A&B, Covid) - Nasopharyngeal Swab     Status: Abnormal  Collection Time: 05/25/20  1:43 PM   Specimen: Nasopharyngeal Swab  Result Value  Ref Range Status   SARS Coronavirus 2 by RT PCR POSITIVE (A) NEGATIVE Final    Comment: RESULT CALLED TO, READ BACK BY AND VERIFIED WITH: RENO, REED AT 1459 ON 05/25/20 BY SS (NOTE) SARS-CoV-2 target nucleic acids are DETECTED.  SARS-CoV-2 RNA is generally detectable in upper respiratory specimens  during the acute phase of infection. Positive results are indicative of the presence of the identified virus, but do not rule out bacterial infection or co-infection with other pathogens not detected by the test. Clinical correlation with patient history and other diagnostic information is necessary to determine patient infection status. The expected result is Negative.  Fact Sheet for Patients:  PinkCheek.be  Fact Sheet for Healthcare Providers: GravelBags.it  This test is not yet approved or cleared by the [place] FDA and  has been authorized for detection and/or diagnosis of SARS-CoV-2 by FDA under an Emergency Use Authorization (EUA).  This EUA will remain in effect (meaning this test can b e used) for the duration of  the COVID-19 declaration under Section 564(b)(1) of the Act, 21 U.S.C. section 360bbb-3(b)(1), unless the authorization is terminated or revoked sooner.      Influenza A by PCR NEGATIVE NEGATIVE Final   Influenza B by PCR NEGATIVE NEGATIVE Final    Comment: (NOTE) The Xpert Xpress SARS-CoV-2/FLU/RSV assay is intended as an aid in  the diagnosis of influenza from Nasopharyngeal swab specimens and  should not be used as a sole basis for treatment. Nasal washings and  aspirates are unacceptable for Xpert Xpress SARS-CoV-2/FLU/RSV  testing.  Fact Sheet for Patients: PinkCheek.be  Fact Sheet for Healthcare Providers: GravelBags.it  This test is not yet approved or cleared by the [place] FDA and  has been authorized for detection and/or  diagnosis of SARS-CoV-2 by  FDA under an Emergency Use Authorization (EUA). This EUA will remain  in effect (meaning this test can be used) for the duration of the  Covid-19 declaration under Section 564(b)(1) of the Act, 21  U.S.C. section 360bbb-3(b)(1), unless the authorization is  terminated or revoked. Performed at [facility], [address] [place], [place] [address]   Culture, blood (Routine X 2) w Reflex to ID Panel     Status: None (Preliminary result)   Collection Time: 05/26/20 12:43 AM   Specimen: BLOOD  Result Value Ref Range Status   Specimen Description BLOOD BLOOD LEFT ARM  Final   Special Requests   Final    BOTTLES DRAWN AEROBIC AND ANAEROBIC Blood Culture results may not be optimal due to an excessive volume of blood received in culture bottles   Culture   Final    NO GROWTH 1 DAY Performed at [facility], [address], [place], [place] [address]    Report Status PENDING  Incomplete  Culture, blood (Routine X 2) w Reflex to ID Panel     Status: None (Preliminary result)   Collection Time: 05/26/20 12:43 AM   Specimen: BLOOD  Result Value Ref Range Status   Specimen Description BLOOD BLOOD RIGHT ARM  Final   Special Requests   Final    BOTTLES DRAWN AEROBIC AND ANAEROBIC Blood Culture results may not be optimal due to an excessive volume of blood received in culture bottles   Culture   Final    NO GROWTH 1 DAY Performed at [facility], [address] [place],  [address]  Report Status PENDING  Incomplete      Imaging Studies   No results found.   Medications   Scheduled Meds:  amLODipine  5 mg Oral Daily   vitamin C  500 mg Oral Daily   aspirin EC  81 mg Oral Daily   atorvastatin  10 mg Oral Daily   baclofen  5 mg Oral QHS   dextromethorphan-guaiFENesin  1 tablet Oral BID   enoxaparin (LOVENOX) injection  40 mg Subcutaneous Q24H   gabapentin  800 mg Oral BID   insulin aspart  0-20 Units Subcutaneous  Q4H   [START ON 05/28/2020] insulin aspart  8 Units Subcutaneous TID WC   insulin glargine  15 Units Subcutaneous BID   ipratropium  2 puff Inhalation Q4H   methylPREDNISolone (SOLU-MEDROL) injection  60 mg Intravenous Q12H   potassium chloride  40 mEq Oral Q4H   tiZANidine  4 mg Oral TID   topiramate  100 mg Oral BID   zinc sulfate  220 mg Oral Daily   Continuous Infusions:  remdesivir 100 mg in NS 100 mL Stopped (05/27/20 1020)       LOS: 2 days    Time spent: 25 minutes with > 50% spent in coordination of care and direct patient contact.     Ezekiel Slocumb, DO [facility]  05/27/2020, 6:48 PM    If 7PM-7AM, please contact night-coverage. How to contact the [facility] Attending or Consulting provider [place] or covering provider during after hours [place], for this patient?    Check the care team in [facility] and look for a) attending/consulting [facility] provider listed and b) the [facility] team listed Log into [email or web address] and use [place]'s universal password to access. If you do not have the password, please contact the hospital operator. Locate the [facility] provider you are looking for under [facility] and page to a number that you can be directly reached. If you still have difficulty reaching the provider, please page the [facility] (Director on Call) for the Hospitalists listed on amion for assistance.

## 2020-05-27 NOTE — Progress Notes (Signed)
Inpatient Diabetes Program Recommendations  AACE/ADA: New Consensus Statement on Inpatient Glycemic Control (2015)  Target Ranges:  Prepandial:   less than 140 mg/dL      Peak postprandial:   less than 180 mg/dL (1-2 hours)      Critically ill patients:  140 - 180 mg/dL   Results for Gabriella Robinson, Gabriella Robinson (MRN [id number]) as of 05/27/2020 12:37  Ref. Range 05/26/2020 08:31 05/26/2020 12:10 05/26/2020 16:33 05/26/2020 21:25 05/27/2020 00:49  Glucose-Capillary Latest Ref Range: 70 - 99 mg/dL  10 units LANTUS @5 :47am 145 (H)  3 units NOVOLOG @10 :30am 431 (H)    10 units LANTUS @2 :03pm 479 (H)  26 units NOVOLOG  268 (H)  11 units NOVOLOG +  10 units LANTUS 170 (H)  4 units NOVOLOG    Results for Gabriella Robinson, Gabriella Robinson (MRN [id number]) as of 05/27/2020 12:37  Ref. Range 05/27/2020 05:12 05/27/2020 05:47 05/27/2020 08:50 05/27/2020 12:20  Glucose-Capillary Latest Ref Range: 70 - 99 mg/dL 58 (L) 184 (H) 297 (H)  17 units NOVOLOG +  10 units LANTUS  373 (H)    Admit with: DKA/ COVID+  History: DM, MS  Home DM Meds: Metformin 1000 mg BID                             Glipizide 5 mg Daily  Current Orders: Lantus 10 units BID                            Novolog Resistant Correction Scale/ SSI (0-20 units) Q4 hours      Novolog 6 units TID with meals    Patient currently receiving Solumedrol 60 mg BID    MD- Suspect Hypoglycemia this AM likely due to the large doses of Novolog pt received late yesterday.  CBGs up to 297/ 373 today  While patient remains on IV Steroids, Please consider:  1. Increase Lantus to 15 units BID (0.3 units/kg)  2. Increase Novolog Meal Coverage to 8 units TID with meals    --Will follow patient during hospitalization--  Wyn Quaker RN, MSN, CDE Diabetes Coordinator Inpatient Glycemic Control Team Team Pager: [phone] (8a-5p)

## 2020-05-27 NOTE — Progress Notes (Signed)
BP 130/81 (BP Location: Right Arm)   Pulse 88   Temp 97.9 F (36.6 C)   Resp 18   Ht 5\' 8"  (1.727 m)   Wt 95 kg   SpO2 94%   BMI 31.85 kg/m   FiO2 (%):  [60 %] 60 %   VS reviewed Keep o2 sats >88%  No further recs Will follow along peripherally

## 2020-05-28 DIAGNOSIS — U071 COVID-19: Secondary | ICD-10-CM | POA: Diagnosis not present

## 2020-05-28 DIAGNOSIS — A419 Sepsis, unspecified organism: Secondary | ICD-10-CM | POA: Diagnosis not present

## 2020-05-28 DIAGNOSIS — R6520 Severe sepsis without septic shock: Secondary | ICD-10-CM

## 2020-05-28 DIAGNOSIS — E119 Type 2 diabetes mellitus without complications: Secondary | ICD-10-CM | POA: Diagnosis not present

## 2020-05-28 DIAGNOSIS — J9601 Acute respiratory failure with hypoxia: Secondary | ICD-10-CM | POA: Diagnosis not present

## 2020-05-28 LAB — CBC WITH DIFFERENTIAL/PLATELET
Abs Immature Granulocytes: 0.07 10*3/uL (ref 0.00–0.07)
Basophils Absolute: 0 10*3/uL (ref 0.0–0.1)
Basophils Relative: 0 %
Eosinophils Absolute: 0 10*3/uL (ref 0.0–0.5)
Eosinophils Relative: 0 %
HCT: 37.4 % (ref 36.0–46.0)
Hemoglobin: 12.5 g/dL (ref 12.0–15.0)
Immature Granulocytes: 1 %
Lymphocytes Relative: 21 %
Lymphs Abs: 1.7 10*3/uL (ref 0.7–4.0)
MCH: 28.2 pg (ref 26.0–34.0)
MCHC: 33.4 g/dL (ref 30.0–36.0)
MCV: 84.4 fL (ref 80.0–100.0)
Monocytes Absolute: 0.6 10*3/uL (ref 0.1–1.0)
Monocytes Relative: 7 %
Neutro Abs: 6 10*3/uL (ref 1.7–7.7)
Neutrophils Relative %: 71 %
Platelets: 444 10*3/uL — ABNORMAL HIGH (ref 150–400)
RBC: 4.43 MIL/uL (ref 3.87–5.11)
RDW: 13.2 % (ref 11.5–15.5)
WBC: 8.3 10*3/uL (ref 4.0–10.5)
nRBC: 0.2 % (ref 0.0–0.2)

## 2020-05-28 LAB — COMPREHENSIVE METABOLIC PANEL
ALT: 20 U/L (ref 0–44)
AST: 19 U/L (ref 15–41)
Albumin: 3 g/dL — ABNORMAL LOW (ref 3.5–5.0)
Alkaline Phosphatase: 75 U/L (ref 38–126)
Anion gap: 7 (ref 5–15)
BUN: 21 mg/dL — ABNORMAL HIGH (ref 6–20)
CO2: 25 mmol/L (ref 22–32)
Calcium: 9.4 mg/dL (ref 8.9–10.3)
Chloride: 106 mmol/L (ref 98–111)
Creatinine, Ser: 0.63 mg/dL (ref 0.44–1.00)
GFR, Estimated: >60 mL/min (ref >60)
Glucose, Bld: 321 mg/dL — ABNORMAL HIGH (ref 70–99)
Potassium: 3.8 mmol/L (ref 3.5–5.1)
Sodium: 138 mmol/L (ref 135–145)
Total Bilirubin: 0.7 mg/dL (ref 0.3–1.2)
Total Protein: 6.9 g/dL (ref 6.5–8.1)

## 2020-05-28 LAB — GLUCOSE, CAPILLARY
Glucose-Capillary: 180 mg/dL — ABNORMAL HIGH (ref 70–99)
Glucose-Capillary: 230 mg/dL — ABNORMAL HIGH (ref 70–99)
Glucose-Capillary: 253 mg/dL — ABNORMAL HIGH (ref 70–99)
Glucose-Capillary: 256 mg/dL — ABNORMAL HIGH (ref 70–99)
Glucose-Capillary: 311 mg/dL — ABNORMAL HIGH (ref 70–99)
Glucose-Capillary: 317 mg/dL — ABNORMAL HIGH (ref 70–99)

## 2020-05-28 LAB — FIBRIN DERIVATIVES D-DIMER (ARMC ONLY): Fibrin derivatives D-dimer (ARMC): 5505.76 ng/mL (FEU) — ABNORMAL HIGH (ref 0.00–499.00)

## 2020-05-28 LAB — MAGNESIUM: Magnesium: 2.2 mg/dL (ref 1.7–2.4)

## 2020-05-28 LAB — FERRITIN: Ferritin: 116 ng/mL (ref 11–307)

## 2020-05-28 LAB — C-REACTIVE PROTEIN: CRP: 4.8 mg/dL — ABNORMAL HIGH (ref <1.0)

## 2020-05-28 MED ORDER — TRAZODONE HCL 100 MG PO TABS
100.0000 mg | ORAL_TABLET | Freq: Every day | ORAL | Status: DC
  Administered 2020-05-28 – 2020-06-05 (×9): 100 mg via ORAL
  Filled 2020-05-28 (×9): qty 1

## 2020-05-28 MED ORDER — BUTALBITAL-APAP-CAFFEINE 50-325-40 MG PO TABS
1.0000 | ORAL_TABLET | Freq: Four times a day (QID) | ORAL | Status: DC | PRN
  Administered 2020-05-28 – 2020-05-31 (×4): 1 via ORAL
  Filled 2020-05-28 (×4): qty 1

## 2020-05-28 MED ORDER — INSULIN ASPART 100 UNIT/ML ~~LOC~~ SOLN
20.0000 [IU] | Freq: Three times a day (TID) | SUBCUTANEOUS | Status: DC
  Administered 2020-05-28 – 2020-06-05 (×23): 20 [IU] via SUBCUTANEOUS
  Filled 2020-05-28 (×21): qty 1

## 2020-05-28 MED ORDER — INSULIN ASPART 100 UNIT/ML ~~LOC~~ SOLN
10.0000 [IU] | Freq: Three times a day (TID) | SUBCUTANEOUS | Status: DC
  Administered 2020-05-28 (×2): 10 [IU] via SUBCUTANEOUS
  Filled 2020-05-28: qty 1

## 2020-05-28 NOTE — Progress Notes (Signed)
Inpatient Diabetes Program Recommendations  AACE/ADA: New Consensus Statement on Inpatient Glycemic Control (2015)  Target Ranges:  Prepandial:   less than 140 mg/dL      Peak postprandial:   less than 180 mg/dL (1-2 hours)      Critically ill patients:  140 - 180 mg/dL   Results for Gabriella Robinson, Gabriella Robinson (MRN [id number]) as of 05/28/2020 13:27  Ref. Range 05/28/2020 00:11 05/28/2020 04:45 05/28/2020 09:18 05/28/2020 13:19  Glucose-Capillary Latest Ref Range: 70 - 99 mg/dL 253 (H) 311 (H)  15 units NOVOLOG  256 (H)  21 units NOVOLOG @10 :35am +  15 units LANTUS 317 (H)    Home DM Meds:Metformin 1000 mg BID Glipizide 5 mg Daily  Current Orders:Lantus 15 units BID Novolog Resistant Correction Scale/ SSI (0-20 units) Q4 hours                            Novolog 10 units TID with meals   Patient currently receiving Solumedrol 60 mg BID    MD- Note Lantus increased last PM and Novolog Meal Coverage increased last PM and again this AM.  CBGs remain >250 since 12am.  If we do not see any improvement with pt's CBGs despite upward insulin adjustments, may consider the following:  1. Increase Lantus further to 20 units BID  (0.4 units/kg)  2. Increase Novolog Meal Coverage to 15 units TID with meals    --Will follow patient during hospitalization--  Wyn Quaker RN, MSN, CDE Diabetes Coordinator Inpatient Glycemic Control Team Team Pager: [phone] (8a-5p)

## 2020-05-28 NOTE — Progress Notes (Signed)
PROGRESS NOTE    Gabriella Robinson   QJJ:[id number]  DOB: 1961/01/21  PCP: Casilda Carls, MD    DOA: 05/25/2020 LOS: 3   Brief Narrative   Gabriella Robinson is a 59 y.o. female with medical history of multiple sclerosis on interferon-beta, hypertension, hyperlipidemia, type 2 diabetes mellitus, migraine headache, opioid dependence, fibromyalgia, neck pain, back pain, who presented to the ED on 05/25/20 with shortness breath, cough, fever and chills, nausea, vomiting, diarrhea x 5 days after close household contact with Covid-19.  Evaluation in the ED - positive Covid-19 PCR. Initial oxygen sat 84% on room air, improved to 90's on 4 L/min.  However, by morning of 10/21, patient requiring heated HFNC at 40 L/min.  Chest xray showed multifocal infiltrates.  Pt had tachycardia, tachypnea as well, meeting criteria for sepsis due to Covid-19 pneumonia.    Initially in HHS with blood glucose of 753.  Started on insulin infusion.  Transitioned to subcutaneous insulin the next morning.    10/21-22: continues to require 40 L/min heated HF.  Hyperglycemic on high dose IV steroids, insulin increased.       Assessment & Plan   Principal Problem:   Pneumonia due to COVID-19 virus Active Problems:   Hyperosmolar hyperglycemic state (HHS) ([place])   Acute respiratory failure with hypoxia (HCC)   Severe sepsis (HCC)   Diabetes mellitus without complication (HCC)   Hypokalemia   AKI (acute kidney injury) ([place])   HTN (hypertension)   HLD (hyperlipidemia)   Multiple sclerosis (HCC)    Acute respiratory failure with hypoxia due to  Multifocal pneumonia due to COVID-19 -  present on admission Patient is requiring 40 L/min oxygen by heated HFNC.  Chest x-ray showed a bilateral infiltrates consistent with PNA.  Covid-19 PCR posititve, and known household contact.  Initial CRP 9.1.   10/21: Discussed baricitinib as another potential therapy for her worsening Covid-19 pneumonia if she has no  contraindications, but she declines it at this time.   10/22: pt feeling worse today, headache, persistent cough, hot.  Had a fever of 100.4 this morning.  CRP improved 9.1>>2.8. 10/23: still feels worse, headache, nausea, remains on 40 L/min heated HFNC --Remdesevir (10/20 >>) --Pt declines baricitinib --Continue IV Solumedrol 60 mg bid --Procal on admission 0.14, will defer antibiotics --vitamin C, zinc --Bronchodilators, Antitussives --f/u Blood culture --follow inflammatory markers, CMP, CBC --prone or lateral decubitus positioning as much as tolerated   Severe Sepsis due to Covid infection - POA, criteria for sepsis including tachycardia, tachypnea, and known Covid-19 infection. Normal lactic acid.  Acute kidney injury reflect end organ dysfunction.  Patient treated in the ED with IV fluids and started on above therapies for Covid-19 infection.  --Monitor closely due to high risk of clinical deterioration.  --Mgmt as above. --Stopping fluids this evening and monitor  Headache - PRN Tylenol or Fioricet. Monitor.  Lactic acidosis - present on admission, due to sepsis. Resolved.  Acute kidney injury - Likely due to dehydration and continuation of Cozaar, HCTZ, NSAIDs.  Resolved with IV hydration. --Hold ibuprofen, Mobic --Resume Cozaar, monitor BP and resume HCTZ tomorrow if indicated --daily BMP to monitor --Avoid nephrotoxins and hypotension --off IV fluids and stable Cr  Hyperosmolar hyperglycemic state (HHS) - POA with glucose 753. Due to infection and possibly noncompliance with medication during illness.  Fluid resuscitated in the ED.  Initially on insulin drip, transitioned to subcutaneous insulin this AM (10/21). Hyperglycemia at this point exacerbated by high dose steroids for hypoxia with  Covid-19 PNA. --insulin as below --resumed on diet   Steroid-induced hyperglycemia -  Type 2 diabetes mellitus - Uncontrolled, HbgA1c is 11%.  Takes Metformin and glipizide at home.   Presented in HHS with blood sugar 753.  Remains hyperglycemic due to steroids.   --Diabetes coordinator following --increase Lantus to 15 units BID --increase mealtime Novolog to 22 units TID WC  --continue resistant sliding scale --Hbg A1c is pending   Hypokalemia - Resolved.  K 3.3 on admission, replaced in ED.  Monitor K and Mg, replace as needed.     Multiple sclerosis - on interferon-Beta injection 3 times per week.  Dr. Blaine Hamper, admitting hospitalist, discussed with patient's neurologist, Dr. Mendel Ryder by phone, recommended to hold interferon beta.   --Continue home baclofen, tizanidine   Hypertension - blood pressure 157/80 on admission.  BP has been very labile today (10/21).   --Resume losartan since AKI resolved --Continue holding HCTZ for now given low K.  Can stop amlodipine when HCTZ resumed. --Started on amlodipine on admission with other BP meds held --Hydralazine PRN   Hyperlipidemia - continue Lipitor  Insomnia - continue home trazodone    Obesity: Body mass index is 35.79 kg/m.  Complicates overall care and prognosis.  DVT prophylaxis: enoxaparin (LOVENOX) injection 40 mg Start: 05/25/20 2200   Diet:  Diet Orders (From admission, onward)    Start     Ordered   05/26/20 0417  Diet heart healthy/carb modified Room service appropriate? Yes; Fluid consistency: Thin  Diet effective now       Question Answer Comment  Diet-HS Snack? Nothing   Room service appropriate? Yes   Fluid consistency: Thin      05/26/20 0416            Code Status: Full Code    Subjective 05/28/20    Pt seen this AM at bedside.  She says she feels even worse again today.  Has a terrible headache.  Says headaches come on around bedtime, keep her from sleeping.  Was terrible this AM, tylenol helped some.  Says not sleeping at all.  Reports she was nauseated earlier this AM, was able to eat a little of her breakfast, no vomiting, better now.   Disposition Plan & Communication   Status  is: Inpatient  Remains inpatient appropriate because:Inpatient level of care appropriate due to severity of illness   Dispo: The patient is from: Home              Anticipated d/c is to: Home              Anticipated d/c date is: 3 days              Patient currently is not medically stable to d/c.   Family Communication: spoke with daughter, [name], by phone 10/21.   Attempted to call this afternoon, but no answer.  Will attempt to call tomorrow.      Consults, Procedures, Significant Events   Consultants:   none  Procedures:   none  Antimicrobials:  Anti-infectives (From admission, onward)   Start     Dose/Rate Route Frequency Ordered Stop   05/26/20 1000  remdesivir 100 mg in sodium chloride 0.9 % 100 mL IVPB       "Followed by" Linked Group Details   100 mg 200 mL/hr over 30 Minutes Intravenous Daily 05/25/20 1441 05/30/20 0959   05/25/20 1700  remdesivir 200 mg in sodium chloride 0.9% 250 mL IVPB       "  Followed by" Linked Group Details   200 mg 580 mL/hr over 30 Minutes Intravenous Once 05/25/20 1441 05/25/20 1630         Objective   Vitals:   05/28/20 0533 05/28/20 0600 05/28/20 0942 05/28/20 1135  BP:      Pulse:      Resp:      Temp:      TempSrc:      SpO2: 94% 98% 93% 93%  Weight:      Height:        Intake/Output Summary (Last 24 hours) at 05/28/2020 1629 Last data filed at 05/28/2020 0511 Gross per 24 hour  Intake --  Output 1700 ml  Net -1700 ml   Filed Weights   05/25/20 1337 05/26/20 1631 05/28/20 0502  Weight: 90.7 kg 95 kg 106.8 kg    Physical Exam:  General exam: sleeping but wakes to voice, ill-appearing, no acute distress, obese Respiratory system: decreased breath sounds, no wheezes, normal respiratory effort, on heated HFNC at 40 L/min. Cardiovascular system: normal S1/S2, RRR, no pedal edema.   Gastrointestinal: soft, non-tender Central nervous system: A&O x3. no gross focal neurologic deficits, normal speech Psychiatry:  depressed mood, congruent affect, judgement and insight appear normal  Labs   Data Reviewed: I have personally reviewed following labs and imaging studies  CBC: Recent Labs  Lab 05/25/20 1343 05/26/20 0431 05/27/20 0622 05/28/20 0517  WBC 10.5 7.3 9.7 8.3  NEUTROABS 8.6* 5.4 6.9 6.0  HGB 12.6 11.7* 11.2* 12.5  HCT 38.0 35.5* 33.3* 37.4  MCV 85.8 85.7 86.5 84.4  PLT 384 410* 422* 702*   Basic Metabolic Panel: Recent Labs  Lab 05/25/20 1807 05/25/20 2006 05/26/20 0311 05/26/20 0431 05/26/20 1047 05/27/20 0622 05/28/20 0517  NA 143   < > 145 145 141 140 138  K 3.2*   < > 3.0* 2.8* 4.8 3.1* 3.8  CL 108   < > 112* 113* 109 107 106  CO2 20*   < > 22 23 20* 23 25  GLUCOSE 476*   < > 253* 199* 262* 188* 321*  BUN 22*   < > 19 19 21* 23* 21*  CREATININE 1.51*   < > 0.99 0.91 1.01* 0.74 0.63  CALCIUM 9.3   < > 9.2 9.3 9.2 9.1 9.4  MG 2.2  --   --  2.2  --  1.9 2.2   < > = values in this interval not displayed.   GFR: Estimated Creatinine Clearance: 98.1 mL/min (by C-G formula based on SCr of 0.63 mg/dL). Liver Function Tests: Recent Labs  Lab 05/25/20 1807 05/26/20 0431 05/27/20 0622 05/28/20 0517  AST 30 24 24 19   ALT 23 21 20 20   ALKPHOS 80 77 65 75  BILITOT 0.9 0.5 0.9 0.7  PROT 8.0 7.1 6.5 6.9  ALBUMIN 3.6 3.1* 2.8* 3.0*   No results for input(s): LIPASE, AMYLASE in the last 168 hours. No results for input(s): AMMONIA in the last 168 hours. Coagulation Profile: No results for input(s): INR, PROTIME in the last 168 hours. Cardiac Enzymes: No results for input(s): CKTOTAL, CKMB, CKMBINDEX, TROPONINI in the last 168 hours. BNP (last 3 results) No results for input(s): PROBNP in the last 8760 hours. HbA1C: Recent Labs    05/25/20 1807  HGBA1C 11.0*   CBG: Recent Labs  Lab 05/27/20 2159 05/28/20 0011 05/28/20 0445 05/28/20 0918 05/28/20 1319  GLUCAP 257* 253* 311* 256* 317*   Lipid Profile: No results for input(s): CHOL, HDL,  LDLCALC, TRIG,  CHOLHDL, LDLDIRECT in the last 72 hours. Thyroid Function Tests: No results for input(s): TSH, T4TOTAL, FREET4, T3FREE, THYROIDAB in the last 72 hours. Anemia Panel: Recent Labs    05/27/20 0622 05/28/20 0517  FERRITIN 134 116   Sepsis Labs: Recent Labs  Lab 05/25/20 1807 05/26/20 0041 05/26/20 0431 05/27/20 0622  PROCALCITON 0.21  --  0.14 <0.10  LATICACIDVEN 3.3* 1.8  --   --     Recent Results (from the past 240 hour(s))  Respiratory Panel by RT PCR (Flu A&B, Covid) - Nasopharyngeal Swab     Status: Abnormal   Collection Time: 05/25/20  1:43 PM   Specimen: Nasopharyngeal Swab  Result Value Ref Range Status   SARS Coronavirus 2 by RT PCR POSITIVE (A) NEGATIVE Final    Comment: RESULT CALLED TO, READ BACK BY AND VERIFIED WITH: RENO, REED AT 1459 ON 05/25/20 BY SS (NOTE) SARS-CoV-2 target nucleic acids are DETECTED.  SARS-CoV-2 RNA is generally detectable in upper respiratory specimens  during the acute phase of infection. Positive results are indicative of the presence of the identified virus, but do not rule out bacterial infection or co-infection with other pathogens not detected by the test. Clinical correlation with patient history and other diagnostic information is necessary to determine patient infection status. The expected result is Negative.  Fact Sheet for Patients:  PinkCheek.be  Fact Sheet for Healthcare Providers: GravelBags.it  This test is not yet approved or cleared by the [place] FDA and  has been authorized for detection and/or diagnosis of SARS-CoV-2 by FDA under an Emergency Use Authorization (EUA).  This EUA will remain in effect (meaning this test can b e used) for the duration of  the COVID-19 declaration under Section 564(b)(1) of the Act, 21 U.S.C. section 360bbb-3(b)(1), unless the authorization is terminated or revoked sooner.      Influenza A by PCR NEGATIVE NEGATIVE  Final   Influenza B by PCR NEGATIVE NEGATIVE Final    Comment: (NOTE) The Xpert Xpress SARS-CoV-2/FLU/RSV assay is intended as an aid in  the diagnosis of influenza from Nasopharyngeal swab specimens and  should not be used as a sole basis for treatment. Nasal washings and  aspirates are unacceptable for Xpert Xpress SARS-CoV-2/FLU/RSV  testing.  Fact Sheet for Patients: PinkCheek.be  Fact Sheet for Healthcare Providers: GravelBags.it  This test is not yet approved or cleared by the [place] FDA and  has been authorized for detection and/or diagnosis of SARS-CoV-2 by  FDA under an Emergency Use Authorization (EUA). This EUA will remain  in effect (meaning this test can be used) for the duration of the  Covid-19 declaration under Section 564(b)(1) of the Act, 21  U.S.C. section 360bbb-3(b)(1), unless the authorization is  terminated or revoked. Performed at [facility], [address] [place], [place] [address]   Culture, blood (Routine X 2) w Reflex to ID Panel     Status: None (Preliminary result)   Collection Time: 05/26/20 12:43 AM   Specimen: BLOOD  Result Value Ref Range Status   Specimen Description BLOOD BLOOD LEFT ARM  Final   Special Requests   Final    BOTTLES DRAWN AEROBIC AND ANAEROBIC Blood Culture results may not be optimal due to an excessive volume of blood received in culture bottles   Culture   Final    NO GROWTH 2 DAYS Performed at [facility], [address] [place], [place] [address]    Report Status PENDING  Incomplete  Culture, blood (Routine X 2) w Reflex to ID Panel     Status: None (Preliminary result)   Collection Time: 05/26/20 12:43 AM   Specimen: BLOOD  Result Value Ref Range Status   Specimen Description BLOOD BLOOD RIGHT ARM  Final   Special Requests   Final    BOTTLES DRAWN AEROBIC AND ANAEROBIC Blood Culture results may not be optimal due to an excessive  volume of blood received in culture bottles   Culture   Final    NO GROWTH 2 DAYS Performed at [facility], [address] [place], [place] [address]    Report Status PENDING  Incomplete      Imaging Studies   No results found.   Medications   Scheduled Meds: . amLODipine  5 mg Oral Daily  . vitamin C  500 mg Oral Daily  . aspirin EC  81 mg Oral Daily  . atorvastatin  10 mg Oral Daily  . baclofen  5 mg Oral QHS  . dextromethorphan-guaiFENesin  1 tablet Oral BID  . enoxaparin (LOVENOX) injection  40 mg Subcutaneous Q24H  . gabapentin  800 mg Oral BID  . insulin aspart  0-20 Units Subcutaneous Q4H  . insulin aspart  10 Units Subcutaneous TID WC  . insulin glargine  15 Units Subcutaneous BID  . ipratropium  2 puff Inhalation Q4H  . losartan  50 mg Oral Daily  . methylPREDNISolone (SOLU-MEDROL) injection  60 mg Intravenous Q12H  . tiZANidine  4 mg Oral TID  . topiramate  100 mg Oral BID  . traZODone  100 mg Oral QHS  . zinc sulfate  220 mg Oral Daily   Continuous Infusions: . remdesivir 100 mg in NS 100 mL 100 mg (05/28/20 1048)       LOS: 3 days    Time spent: 25 minutes with > 50% spent in coordination of care and direct patient contact.     Ezekiel Slocumb, DO [facility]  05/28/2020, 4:29 PM    If 7PM-7AM, please contact night-coverage. How to contact the [facility] Attending or Consulting provider Door or covering provider during after hours [place], for this patient?    1. Check the care team in [facility] and look for a) attending/consulting TR[facility] provider listed and b) the [facility] team listed 2. Log into [email or web address] and use [place]'s universal password to access. If you do not have the password, please contact the hospital operator. 3. Locate the [facility] provider you are looking for under [facility] and page to a number that you can be directly reached. 4. If you still have difficulty reaching the provider, please page the [facility] (Director on  Call) for the Hospitalists listed on amion for assistance.

## 2020-05-29 DIAGNOSIS — J1282 Pneumonia due to coronavirus disease 2019: Secondary | ICD-10-CM | POA: Diagnosis not present

## 2020-05-29 DIAGNOSIS — J9601 Acute respiratory failure with hypoxia: Secondary | ICD-10-CM | POA: Diagnosis not present

## 2020-05-29 DIAGNOSIS — A419 Sepsis, unspecified organism: Secondary | ICD-10-CM | POA: Diagnosis not present

## 2020-05-29 DIAGNOSIS — U071 COVID-19: Secondary | ICD-10-CM | POA: Diagnosis not present

## 2020-05-29 LAB — MAGNESIUM: Magnesium: 2.2 mg/dL (ref 1.7–2.4)

## 2020-05-29 LAB — COMPREHENSIVE METABOLIC PANEL
ALT: 19 U/L (ref 0–44)
AST: 20 U/L (ref 15–41)
Albumin: 3 g/dL — ABNORMAL LOW (ref 3.5–5.0)
Alkaline Phosphatase: 72 U/L (ref 38–126)
Anion gap: 10 (ref 5–15)
BUN: 21 mg/dL — ABNORMAL HIGH (ref 6–20)
CO2: 22 mmol/L (ref 22–32)
Calcium: 9.5 mg/dL (ref 8.9–10.3)
Chloride: 107 mmol/L (ref 98–111)
Creatinine, Ser: 0.75 mg/dL (ref 0.44–1.00)
GFR, Estimated: >60 mL/min (ref >60)
Glucose, Bld: 145 mg/dL — ABNORMAL HIGH (ref 70–99)
Potassium: 3.6 mmol/L (ref 3.5–5.1)
Sodium: 139 mmol/L (ref 135–145)
Total Bilirubin: 0.6 mg/dL (ref 0.3–1.2)
Total Protein: 7.3 g/dL (ref 6.5–8.1)

## 2020-05-29 LAB — FIBRIN DERIVATIVES D-DIMER (ARMC ONLY): Fibrin derivatives D-dimer (ARMC): 4947.36 ng/mL (FEU) — ABNORMAL HIGH (ref 0.00–499.00)

## 2020-05-29 LAB — CBC WITH DIFFERENTIAL/PLATELET
Abs Immature Granulocytes: 0.07 10*3/uL (ref 0.00–0.07)
Basophils Absolute: 0 10*3/uL (ref 0.0–0.1)
Basophils Relative: 0 %
Eosinophils Absolute: 0 10*3/uL (ref 0.0–0.5)
Eosinophils Relative: 0 %
HCT: 41.5 % (ref 36.0–46.0)
Hemoglobin: 13.5 g/dL (ref 12.0–15.0)
Immature Granulocytes: 1 %
Lymphocytes Relative: 18 %
Lymphs Abs: 1.7 10*3/uL (ref 0.7–4.0)
MCH: 28 pg (ref 26.0–34.0)
MCHC: 32.5 g/dL (ref 30.0–36.0)
MCV: 86.1 fL (ref 80.0–100.0)
Monocytes Absolute: 0.6 10*3/uL (ref 0.1–1.0)
Monocytes Relative: 6 %
Neutro Abs: 7.3 10*3/uL (ref 1.7–7.7)
Neutrophils Relative %: 75 %
Platelets: 553 10*3/uL — ABNORMAL HIGH (ref 150–400)
RBC: 4.82 MIL/uL (ref 3.87–5.11)
RDW: 13.5 % (ref 11.5–15.5)
WBC: 9.7 10*3/uL (ref 4.0–10.5)
nRBC: 0 % (ref 0.0–0.2)

## 2020-05-29 LAB — C-REACTIVE PROTEIN: CRP: 1.6 mg/dL — ABNORMAL HIGH (ref <1.0)

## 2020-05-29 LAB — GLUCOSE, CAPILLARY
Glucose-Capillary: 138 mg/dL — ABNORMAL HIGH (ref 70–99)
Glucose-Capillary: 162 mg/dL — ABNORMAL HIGH (ref 70–99)
Glucose-Capillary: 165 mg/dL — ABNORMAL HIGH (ref 70–99)
Glucose-Capillary: 191 mg/dL — ABNORMAL HIGH (ref 70–99)
Glucose-Capillary: 206 mg/dL — ABNORMAL HIGH (ref 70–99)
Glucose-Capillary: 225 mg/dL — ABNORMAL HIGH (ref 70–99)

## 2020-05-29 LAB — FERRITIN: Ferritin: 113 ng/mL (ref 11–307)

## 2020-05-29 MED ORDER — ENSURE ENLIVE PO LIQD
237.0000 mL | Freq: Every evening | ORAL | Status: DC
  Administered 2020-05-29 – 2020-05-30 (×2): 237 mL via ORAL

## 2020-05-29 MED ORDER — INFLUENZA VAC SPLIT QUAD 0.5 ML IM SUSY: 0.5000 mL | PREFILLED_SYRINGE | INTRAMUSCULAR | Status: DC

## 2020-05-29 NOTE — Progress Notes (Signed)
PROGRESS NOTE    Gabriella Robinson   EXN:[id number]  DOB: Feb 05, 1961  PCP: Casilda Carls, MD    DOA: 05/25/2020 LOS: 4   Brief Narrative   Gabriella Robinson is a 59 y.o. female with medical history of multiple sclerosis on interferon-beta, hypertension, hyperlipidemia, type 2 diabetes mellitus, migraine headache, opioid dependence, fibromyalgia, neck pain, back pain, who presented to the ED on 05/25/20 with shortness breath, cough, fever and chills, nausea, vomiting, diarrhea x 5 days after close household contact with Covid-19.  Evaluation in the ED - positive Covid-19 PCR. Initial oxygen sat 84% on room air, improved to 90's on 4 L/min.  However, by morning of 10/21, patient requiring heated HFNC at 40 L/min.  Chest xray showed multifocal infiltrates.  Pt had tachycardia, tachypnea as well, meeting criteria for sepsis due to Covid-19 pneumonia.    Initially in HHS with blood glucose of 753.  Started on insulin infusion.  Transitioned to subcutaneous insulin the next morning.    10/21-22: continues to require 40 L/min heated HF.  Hyperglycemic on high dose IV steroids, insulin increased.       Assessment & Plan   Principal Problem:   Pneumonia due to COVID-19 virus Active Problems:   Hyperosmolar hyperglycemic state (HHS) ([place])   Acute respiratory failure with hypoxia (HCC)   Severe sepsis (HCC)   Diabetes mellitus without complication (HCC)   Hypokalemia   AKI (acute kidney injury) ([employer])   HTN (hypertension)   HLD (hyperlipidemia)   Multiple sclerosis (HCC)    Acute respiratory failure with hypoxia due to  Multifocal pneumonia due to COVID-19 -  present on admission Patient is requiring 40 L/min oxygen by heated HFNC.  Chest x-ray showed a bilateral infiltrates consistent with PNA.  Covid-19 PCR posititve, and known household contact.  Initial CRP 9.1.   10/21: Discussed baricitinib as another potential therapy for her worsening Covid-19 pneumonia if she has no  contraindications, but she declines it at this time.   10/22: pt feeling worse today, headache, persistent cough, hot.  Had a fever of 100.4 this morning.  CRP improved 9.1>>2.8. 10/23: still feels worse, headache, nausea, remains on 40 L/min heated HFNC 10/24: O2 requirement up slightly to 45 L/min, 80% FiO2 --Remdesevir (10/20 >>) --Pt declines baricitinib --Continue IV Solumedrol 60 mg bid --Procal on admission 0.14, will defer antibiotics --vitamin C, zinc --Bronchodilators, Antitussives --f/u Blood culture --follow inflammatory markers, CMP, CBC --prone or lateral decubitus positioning as much as tolerated --check BNP and procal with AM labs   Severe Sepsis due to Covid infection - POA, criteria for sepsis including tachycardia, tachypnea, and known Covid-19 infection. Normal lactic acid.  Acute kidney injury reflects end organ dysfunction.  Patient treated in the ED with IV fluids and started on above therapies for Covid-19 infection.  --Monitor closely due to high risk of clinical deterioration.  --Mgmt as above. --Stopping fluids this evening and monitor  Headache - PRN Tylenol or Fioricet. Monitor.  Lactic acidosis - present on admission, due to sepsis. Resolved.  Acute kidney injury - Likely due to dehydration and continuation of Cozaar, HCTZ, NSAIDs.  Resolved with IV hydration. --Hold ibuprofen, Mobic --Resume Cozaar, monitor BP and resume HCTZ tomorrow if indicated --daily BMP to monitor --Avoid nephrotoxins and hypotension --off IV fluids and stable Cr  Hyperosmolar hyperglycemic state (HHS) - POA with glucose 753. Due to infection and possibly noncompliance with medication during illness.  Fluid resuscitated in the ED.  Initially on insulin drip, transitioned to  subcutaneous insulin this AM (10/21). Hyperglycemia at this point exacerbated by high dose steroids for hypoxia with Covid-19 PNA. --insulin as below --resumed on diet   Steroid-induced hyperglycemia -    Type 2 diabetes mellitus - Uncontrolled, HbgA1c is 11%.  Takes Metformin and glipizide at home.  Presented in HHS with blood sugar 753.  Remains hyperglycemic due to steroids.   --Diabetes coordinator following --increase Lantus to 15 units BID --increase mealtime Novolog to 22 units TID WC  --continue resistant sliding scale --Hbg A1c is pending   Hypokalemia - Resolved.  K 3.3 on admission, replaced in ED.  Monitor K and Mg, replace as needed.     Multiple sclerosis - on interferon-Beta injection 3 times per week.  Dr. Blaine Hamper, admitting hospitalist, discussed with patient's neurologist, Dr. Mendel Ryder by phone, recommended to hold interferon beta.   --Continue home baclofen, tizanidine   Hypertension - blood pressure 157/80 on admission.  BP has been very labile today (10/21).   --Resume losartan since AKI resolved --Continue holding HCTZ for now given low K.  Can stop amlodipine when HCTZ resumed. --Started on amlodipine on admission with other BP meds held --Hydralazine PRN   Hyperlipidemia - continue Lipitor  Insomnia - continue home trazodone    Obesity: Body mass index is 38.47 kg/m.  Complicates overall care and prognosis.  DVT prophylaxis: enoxaparin (LOVENOX) injection 40 mg Start: 05/25/20 2200   Diet:  Diet Orders (From admission, onward)    Start     Ordered   05/26/20 0417  Diet heart healthy/carb modified Room service appropriate? Yes; Fluid consistency: Thin  Diet effective now       Question Answer Comment  Diet-HS Snack? Nothing   Room service appropriate? Yes   Fluid consistency: Thin      05/26/20 0416            Code Status: Full Code    Subjective 05/29/20    Pt seen this AM at bedside.  States feeling worse again today.  Had another bad headache this AM.  She asks if she can get snack or sandwich in the evening, as she isn't used to eating dinner so early, and is starving with bad headache by morning. Not short of breath, but still having bad  coughing fits.  Denies chest pain with cough or inspirations.   Disposition Plan & Communication   Status is: Inpatient  Remains inpatient appropriate because:Inpatient level of care appropriate due to severity of illness   Dispo: The patient is from: Home              Anticipated d/c is to: Home              Anticipated d/c date is: 3 days              Patient currently is not medically stable to d/c.   Family Communication: spoke with daughter, [name], by phone this afternoon 10/24.       Consults, Procedures, Significant Events   Consultants:   none  Procedures:   none  Antimicrobials:  Anti-infectives (From admission, onward)   Start     Dose/Rate Route Frequency Ordered Stop   05/26/20 1000  remdesivir 100 mg in sodium chloride 0.9 % 100 mL IVPB       "Followed by" Linked Group Details   100 mg 200 mL/hr over 30 Minutes Intravenous Daily 05/25/20 1441 05/29/20 1020   05/25/20 1700  remdesivir 200 mg in sodium chloride 0.9% 250 mL  IVPB       "Followed by" Linked Group Details   200 mg 580 mL/hr over 30 Minutes Intravenous Once 05/25/20 1441 05/25/20 1630         Objective   Vitals:   05/29/20 0755 05/29/20 0845 05/29/20 1244 05/29/20 1300  BP:  (!) 156/94  105/76  Pulse:  89  95  Resp:  17  20  Temp:  98.5 F (36.9 C)  98.6 F (37 C)  TempSrc:  Oral  Oral  SpO2: 91% 93% 92% 95%  Weight:      Height:        Intake/Output Summary (Last 24 hours) at 05/29/2020 1537 Last data filed at 05/29/2020 1300 Gross per 24 hour  Intake 600 ml  Output 2050 ml  Net -1450 ml   Filed Weights   05/26/20 1631 05/28/20 0502 05/29/20 0458  Weight: 95 kg 106.8 kg 114.8 kg    Physical Exam:  General exam: sleeping but woke up to voice, ill-appearing, no acute distress, obese Respiratory system: referred upper airway sounds, no wheezes, normal respiratory effort, on heated HFNC at 45 L/min. Cardiovascular system: normal S1/S2, RRR, no pedal edema.     Gastrointestinal: soft, non-tender Central nervous system: A&O x3. no gross focal neurologic deficits, normal speech Psychiatry: depressed mood, congruent affect, judgement and insight appear normal  Labs   Data Reviewed: I have personally reviewed following labs and imaging studies  CBC: Recent Labs  Lab 05/25/20 1343 05/26/20 0431 05/27/20 0622 05/28/20 0517 05/29/20 0646  WBC 10.5 7.3 9.7 8.3 9.7  NEUTROABS 8.6* 5.4 6.9 6.0 7.3  HGB 12.6 11.7* 11.2* 12.5 13.5  HCT 38.0 35.5* 33.3* 37.4 41.5  MCV 85.8 85.7 86.5 84.4 86.1  PLT 384 410* 422* 444* 161*   Basic Metabolic Panel: Recent Labs  Lab 05/25/20 1807 05/25/20 2006 05/26/20 0431 05/26/20 1047 05/27/20 0622 05/28/20 0517 05/29/20 0646  NA 143   < > 145 141 140 138 139  K 3.2*   < > 2.8* 4.8 3.1* 3.8 3.6  CL 108   < > 113* 109 107 106 107  CO2 20*   < > 23 20* 23 25 22   GLUCOSE 476*   < > 199* 262* 188* 321* 145*  BUN 22*   < > 19 21* 23* 21* 21*  CREATININE 1.51*   < > 0.91 1.01* 0.74 0.63 0.75  CALCIUM 9.3   < > 9.3 9.2 9.1 9.4 9.5  MG 2.2  --  2.2  --  1.9 2.2 2.2   < > = values in this interval not displayed.   GFR: Estimated Creatinine Clearance: 102 mL/min (by C-G formula based on SCr of 0.75 mg/dL). Liver Function Tests: Recent Labs  Lab 05/25/20 1807 05/26/20 0431 05/27/20 0622 05/28/20 0517 05/29/20 0646  AST 30 24 24 19 20   ALT 23 21 20 20 19   ALKPHOS 80 77 65 75 72  BILITOT 0.9 0.5 0.9 0.7 0.6  PROT 8.0 7.1 6.5 6.9 7.3  ALBUMIN 3.6 3.1* 2.8* 3.0* 3.0*   No results for input(s): LIPASE, AMYLASE in the last 168 hours. No results for input(s): AMMONIA in the last 168 hours. Coagulation Profile: No results for input(s): INR, PROTIME in the last 168 hours. Cardiac Enzymes: No results for input(s): CKTOTAL, CKMB, CKMBINDEX, TROPONINI in the last 168 hours. BNP (last 3 results) No results for input(s): PROBNP in the last 8760 hours. HbA1C: No results for input(s): HGBA1C in the last 72  hours. CBG:  Recent Labs  Lab 05/28/20 2043 05/29/20 0020 05/29/20 0456 05/29/20 0844 05/29/20 1314  GLUCAP 180* 191* 165* 162* 206*   Lipid Profile: No results for input(s): CHOL, HDL, LDLCALC, TRIG, CHOLHDL, LDLDIRECT in the last 72 hours. Thyroid Function Tests: No results for input(s): TSH, T4TOTAL, FREET4, T3FREE, THYROIDAB in the last 72 hours. Anemia Panel: Recent Labs    05/28/20 0517 05/29/20 0646  FERRITIN 116 113   Sepsis Labs: Recent Labs  Lab 05/25/20 1807 05/26/20 0041 05/26/20 0431 05/27/20 0622  PROCALCITON 0.21  --  0.14 <0.10  LATICACIDVEN 3.3* 1.8  --   --     Recent Results (from the past 240 hour(s))  Respiratory Panel by RT PCR (Flu A&B, Covid) - Nasopharyngeal Swab     Status: Abnormal   Collection Time: 05/25/20  1:43 PM   Specimen: Nasopharyngeal Swab  Result Value Ref Range Status   SARS Coronavirus 2 by RT PCR POSITIVE (A) NEGATIVE Final    Comment: RESULT CALLED TO, READ BACK BY AND VERIFIED WITH: RENO, REED AT 1459 ON 05/25/20 BY SS (NOTE) SARS-CoV-2 target nucleic acids are DETECTED.  SARS-CoV-2 RNA is generally detectable in upper respiratory specimens  during the acute phase of infection. Positive results are indicative of the presence of the identified virus, but do not rule out bacterial infection or co-infection with other pathogens not detected by the test. Clinical correlation with patient history and other diagnostic information is necessary to determine patient infection status. The expected result is Negative.  Fact Sheet for Patients:  PinkCheek.be  Fact Sheet for Healthcare Providers: GravelBags.it  This test is not yet approved or cleared by the [place] FDA and  has been authorized for detection and/or diagnosis of SARS-CoV-2 by FDA under an Emergency Use Authorization (EUA).  This EUA will remain in effect (meaning this test can b e used) for the  duration of  the COVID-19 declaration under Section 564(b)(1) of the Act, 21 U.S.C. section 360bbb-3(b)(1), unless the authorization is terminated or revoked sooner.      Influenza A by PCR NEGATIVE NEGATIVE Final   Influenza B by PCR NEGATIVE NEGATIVE Final    Comment: (NOTE) The Xpert Xpress SARS-CoV-2/FLU/RSV assay is intended as an aid in  the diagnosis of influenza from Nasopharyngeal swab specimens and  should not be used as a sole basis for treatment. Nasal washings and  aspirates are unacceptable for Xpert Xpress SARS-CoV-2/FLU/RSV  testing.  Fact Sheet for Patients: PinkCheek.be  Fact Sheet for Healthcare Providers: GravelBags.it  This test is not yet approved or cleared by the [place] FDA and  has been authorized for detection and/or diagnosis of SARS-CoV-2 by  FDA under an Emergency Use Authorization (EUA). This EUA will remain  in effect (meaning this test can be used) for the duration of the  Covid-19 declaration under Section 564(b)(1) of the Act, 21  U.S.C. section 360bbb-3(b)(1), unless the authorization is  terminated or revoked. Performed at [facility], [address] [place], [place] [address]   Culture, blood (Routine X 2) w Reflex to ID Panel     Status: None (Preliminary result)   Collection Time: 05/26/20 12:43 AM   Specimen: BLOOD  Result Value Ref Range Status   Specimen Description BLOOD BLOOD LEFT ARM  Final   Special Requests   Final    BOTTLES DRAWN AEROBIC AND ANAEROBIC Blood Culture results may not be optimal due to an excessive volume of blood received in culture bottles   Culture  Final    NO GROWTH 3 DAYS Performed at [facility], [address] [place], [place] [address]    Report Status PENDING  Incomplete  Culture, blood (Routine X 2) w Reflex to ID Panel     Status: None (Preliminary result)   Collection Time: 05/26/20 12:43 AM   Specimen: BLOOD    Result Value Ref Range Status   Specimen Description BLOOD BLOOD RIGHT ARM  Final   Special Requests   Final    BOTTLES DRAWN AEROBIC AND ANAEROBIC Blood Culture results may not be optimal due to an excessive volume of blood received in culture bottles   Culture   Final    NO GROWTH 3 DAYS Performed at [facility], [address], [place], [place] [address]    Report Status PENDING  Incomplete      Imaging Studies   No results found.   Medications   Scheduled Meds: . amLODipine  5 mg Oral Daily  . vitamin C  500 mg Oral Daily  . aspirin EC  81 mg Oral Daily  . atorvastatin  10 mg Oral Daily  . baclofen  5 mg Oral QHS  . dextromethorphan-guaiFENesin  1 tablet Oral BID  . enoxaparin (LOVENOX) injection  40 mg Subcutaneous Q24H  . gabapentin  800 mg Oral BID  . [START ON 05/30/2020] influenza vac split quadrivalent PF  0.5 mL Intramuscular Tomorrow-1000  . insulin aspart  0-20 Units Subcutaneous Q4H  . insulin aspart  20 Units Subcutaneous TID WC  . insulin glargine  15 Units Subcutaneous BID  . ipratropium  2 puff Inhalation Q4H  . losartan  50 mg Oral Daily  . methylPREDNISolone (SOLU-MEDROL) injection  60 mg Intravenous Q12H  . tiZANidine  4 mg Oral TID  . topiramate  100 mg Oral BID  . traZODone  100 mg Oral QHS  . zinc sulfate  220 mg Oral Daily   Continuous Infusions:      LOS: 4 days    Time spent: 25 minutes with > 50% spent in coordination of care and direct patient contact.     Ezekiel Slocumb, DO [facility]  05/29/2020, 3:37 PM    If 7PM-7AM, please contact night-coverage. How to contact the [facility] Attending or Consulting provider [place] or covering provider during after hours [place], for this patient?    1. Check the care team in [facility] and look for a) attending/consulting TRH provider listed and b) the [facility] team listed 2. Log into [email or web address] and use [place]'s universal password to access. If you do not have the password,  please contact the hospital operator. 3. Locate the [facility] provider you are looking for under [facility] and page to a number that you can be directly reached. 4. If you still have difficulty reaching the provider, please page the [facility] (Director on Call) for the Hospitalists listed on amion for assistance.

## 2020-05-29 NOTE — Evaluation (Signed)
Physical Therapy Evaluation Patient Details Name: Gabriella Robinson MRN: [id number] DOB: 07-17-1961 Today's Date: 05/29/2020   History of Present Illness  Patient presents with COVID, MS, HTN, HLD, DM, Hypersosmolar hyperglycemic state, hypokalemia, AKI, sepsis, fibromyalgia, opiod dependence. At baseline is ind with most ADLs. At this time is now on high flow nasal cannula 45 L/min with Fi02 80%. Patient was independent prior to admission living with family.    Clinical Impression  Patient is a very pleasant 59 year old female who presents with diffuse weakness and limited tolerance to mobility secondary to COVID. Patient is on high flow nasal cannula 45 L/min with Fi02 80% and in bed upon PT arrival. Prior to admission patient lived in an apartment with 30 steps to enter, no elevator, with her daughters and grandson. She reports being ind with mobility and ADLs at baseline and does not use an AD. Patient refused to transition to EOB however was agreeable to roll and perform supine interventions. Her RLE is diffusely weaker than her LLE and she requires rest breaks with cueing for breathing to bring spO2 >90 due to dropping to 87% with movements of RLE. Patient is eager to get stronger despite refusal of sitting EOB. Is very fatigued by end of session and left in bed with all needs met. Patient will benefit from skilled physical therapy while in the hospital to increase strength, mobility, and capacity for functional mobility. Upon discharge patient will benefit from placement in SNF due to need for additional therapy, high level of PLOF, and impairments described above.     Follow Up Recommendations SNF    Equipment Recommendations  Rolling walker with 5" wheels    Recommendations for Other Services       Precautions / Restrictions Precautions Precautions: Fall Restrictions Weight Bearing Restrictions: No      Mobility  Bed Mobility Overal bed mobility: Needs Assistance Bed Mobility:  Rolling Rolling: Min assist         General bed mobility comments: patient fatigues quickly rolling R and L, requires cueing for sequencing. Patient declined supine to sit transfer.    Transfers                 General transfer comment: Patient refused  Ambulation/Gait             General Gait Details: not safe at this time  Stairs            Wheelchair Mobility    Modified Rankin (Stroke Patients Only)       Balance Overall balance assessment: Needs assistance     Sitting balance - Comments: unable to test this session due to patient refusing to sit EOB       Standing balance comment: unable to test this session                             Pertinent Vitals/Pain Pain Assessment: No/denies pain    Home Living Family/patient expects to be discharged to:: Private residence Living Arrangements: Children Available Help at Discharge: Family Type of Home: Apartment Home Access: Stairs to enter Entrance Stairs-Rails: Right Entrance Stairs-Number of Steps: 30 Home Layout: One level Home Equipment: None Additional Comments: Patient lives at home with her two daugthers and grandson. No equipment at home per patient report.    Prior Function Level of Independence: Independent         Comments: Patient reports she is ind at baseline. Has  MS and is on disability but does require any AD's or assistance with ADLs     Hand Dominance        Extremity/Trunk Assessment   Upper Extremity Assessment Upper Extremity Assessment: Overall WFL for tasks assessed;Generalized weakness (grossly 4-/5)    Lower Extremity Assessment Lower Extremity Assessment: Generalized weakness (unable to test EOB, RLE grossly 3-/5 LLE grossly 4/5)       Communication   Communication: No difficulties  Cognition Arousal/Alertness: Awake/alert Behavior During Therapy: WFL for tasks assessed/performed Overall Cognitive Status: Within Functional Limits for  tasks assessed                                 General Comments: Patient is A and O x 4      General Comments      Exercises Other Exercises Other Exercises: supine strengthening interventions BLE: more challening on RLE, rest breaks with breathing education between due to SP02 dropping <90: ankle pumps 10x, heel slides 10x , hip abduction 10x, SLR 5x   Assessment/Plan    PT Assessment Patient needs continued PT services  PT Problem List Decreased strength;Decreased activity tolerance;Decreased balance;Decreased mobility;Cardiopulmonary status limiting activity;Obesity       PT Treatment Interventions DME instruction;Gait training;Stair training;Functional mobility training;Neuromuscular re-education;Balance training;Therapeutic exercise;Therapeutic activities;Patient/family education;Wheelchair mobility training    PT Goals (Current goals can be found in the Care Plan section)  Acute Rehab PT Goals Patient Stated Goal: to get stronger PT Goal Formulation: With patient Time For Goal Achievement: 06/12/20 Potential to Achieve Goals: Fair    Frequency Min 2X/week   Barriers to discharge Inaccessible home environment patient will benefit from SNF placement    Co-evaluation               AM-PAC PT "6 Clicks" Mobility  Outcome Measure Help needed turning from your back to your side while in a flat bed without using bedrails?: A Little Help needed moving from lying on your back to sitting on the side of a flat bed without using bedrails?: A Lot Help needed moving to and from a bed to a chair (including a wheelchair)?: Total Help needed standing up from a chair using your arms (e.g., wheelchair or bedside chair)?: Total Help needed to walk in hospital room?: Total Help needed climbing 3-5 steps with a railing? : Total 6 Click Score: 9    End of Session   Activity Tolerance: Patient limited by fatigue Patient left: in bed;with call bell/phone within  reach;with bed alarm set;with family/visitor present Nurse Communication: Mobility status PT Visit Diagnosis: Unsteadiness on feet (R26.81);Other abnormalities of gait and mobility (R26.89);Muscle weakness (generalized) (M62.81);Difficulty in walking, not elsewhere classified (R26.2)    Time: 8457-3344 PT Time Calculation (min) (ACUTE ONLY): 24 min   Charges:   PT Evaluation $PT Eval Moderate Complexity: 1 Mod PT Treatments $Therapeutic Exercise: 8-22 mins       Janna Arch, PT, DPT   05/29/2020, 3:56 PM

## 2020-05-30 ENCOUNTER — Encounter: Payer: Self-pay | Admitting: Internal Medicine

## 2020-05-30 DIAGNOSIS — J1282 Pneumonia due to coronavirus disease 2019: Secondary | ICD-10-CM | POA: Diagnosis not present

## 2020-05-30 DIAGNOSIS — U071 COVID-19: Secondary | ICD-10-CM | POA: Diagnosis not present

## 2020-05-30 DIAGNOSIS — J9601 Acute respiratory failure with hypoxia: Secondary | ICD-10-CM | POA: Diagnosis not present

## 2020-05-30 LAB — CBC WITH DIFFERENTIAL/PLATELET
Abs Immature Granulocytes: 0.08 10*3/uL — ABNORMAL HIGH (ref 0.00–0.07)
Basophils Absolute: 0 10*3/uL (ref 0.0–0.1)
Basophils Relative: 0 %
Eosinophils Absolute: 0 10*3/uL (ref 0.0–0.5)
Eosinophils Relative: 0 %
HCT: 36.8 % (ref 36.0–46.0)
Hemoglobin: 12.1 g/dL (ref 12.0–15.0)
Immature Granulocytes: 1 %
Lymphocytes Relative: 14 %
Lymphs Abs: 1.5 10*3/uL (ref 0.7–4.0)
MCH: 28.3 pg (ref 26.0–34.0)
MCHC: 32.9 g/dL (ref 30.0–36.0)
MCV: 86 fL (ref 80.0–100.0)
Monocytes Absolute: 0.6 10*3/uL (ref 0.1–1.0)
Monocytes Relative: 5 %
Neutro Abs: 8.6 10*3/uL — ABNORMAL HIGH (ref 1.7–7.7)
Neutrophils Relative %: 80 %
Platelets: 583 10*3/uL — ABNORMAL HIGH (ref 150–400)
RBC: 4.28 MIL/uL (ref 3.87–5.11)
RDW: 13.3 % (ref 11.5–15.5)
WBC: 10.7 10*3/uL — ABNORMAL HIGH (ref 4.0–10.5)
nRBC: 0.2 % (ref 0.0–0.2)

## 2020-05-30 LAB — GLUCOSE, CAPILLARY
Glucose-Capillary: 172 mg/dL — ABNORMAL HIGH (ref 70–99)
Glucose-Capillary: 205 mg/dL — ABNORMAL HIGH (ref 70–99)
Glucose-Capillary: 208 mg/dL — ABNORMAL HIGH (ref 70–99)
Glucose-Capillary: 279 mg/dL — ABNORMAL HIGH (ref 70–99)
Glucose-Capillary: 295 mg/dL — ABNORMAL HIGH (ref 70–99)

## 2020-05-30 LAB — COMPREHENSIVE METABOLIC PANEL
ALT: 26 U/L (ref 0–44)
AST: 24 U/L (ref 15–41)
Albumin: 2.7 g/dL — ABNORMAL LOW (ref 3.5–5.0)
Alkaline Phosphatase: 69 U/L (ref 38–126)
Anion gap: 8 (ref 5–15)
BUN: 24 mg/dL — ABNORMAL HIGH (ref 6–20)
CO2: 24 mmol/L (ref 22–32)
Calcium: 9.1 mg/dL (ref 8.9–10.3)
Chloride: 104 mmol/L (ref 98–111)
Creatinine, Ser: 0.65 mg/dL (ref 0.44–1.00)
GFR, Estimated: >60 mL/min (ref >60)
Glucose, Bld: 207 mg/dL — ABNORMAL HIGH (ref 70–99)
Potassium: 3.6 mmol/L (ref 3.5–5.1)
Sodium: 136 mmol/L (ref 135–145)
Total Bilirubin: 0.7 mg/dL (ref 0.3–1.2)
Total Protein: 6.7 g/dL (ref 6.5–8.1)

## 2020-05-30 LAB — BRAIN NATRIURETIC PEPTIDE: B Natriuretic Peptide: 28.2 pg/mL (ref 0.0–100.0)

## 2020-05-30 LAB — FERRITIN: Ferritin: 91 ng/mL (ref 11–307)

## 2020-05-30 LAB — MAGNESIUM: Magnesium: 2.4 mg/dL (ref 1.7–2.4)

## 2020-05-30 LAB — C-REACTIVE PROTEIN: CRP: 0.9 mg/dL (ref <1.0)

## 2020-05-30 LAB — FIBRIN DERIVATIVES D-DIMER (ARMC ONLY): Fibrin derivatives D-dimer (ARMC): 3299.52 ng/mL (FEU) — ABNORMAL HIGH (ref 0.00–499.00)

## 2020-05-30 LAB — PROCALCITONIN: Procalcitonin: <0.1 ng/mL

## 2020-05-30 MED ORDER — INSULIN GLARGINE 100 UNIT/ML ~~LOC~~ SOLN
25.0000 [IU] | Freq: Two times a day (BID) | SUBCUTANEOUS | Status: DC
  Administered 2020-05-30 – 2020-06-02 (×6): 25 [IU] via SUBCUTANEOUS
  Filled 2020-05-30 (×9): qty 0.25

## 2020-05-30 MED ORDER — ENOXAPARIN SODIUM 60 MG/0.6ML ~~LOC~~ SOLN
0.5000 mg/kg | SUBCUTANEOUS | Status: DC
  Administered 2020-05-30 – 2020-06-01 (×2): 52.5 mg via SUBCUTANEOUS
  Filled 2020-05-30 (×4): qty 0.6

## 2020-05-30 MED ORDER — SODIUM CHLORIDE 0.9 % IV SOLN
100.0000 mg | Freq: Every day | INTRAVENOUS | Status: AC
  Administered 2020-05-30 – 2020-06-03 (×5): 100 mg via INTRAVENOUS
  Filled 2020-05-30: qty 100
  Filled 2020-05-30 (×2): qty 20
  Filled 2020-05-30: qty 100
  Filled 2020-05-30 (×2): qty 20

## 2020-05-30 NOTE — Progress Notes (Addendum)
Inpatient Diabetes Program Recommendations  AACE/ADA: New Consensus Statement on Inpatient Glycemic Control (2015)  Target Ranges:  Prepandial:   less than 140 mg/dL      Peak postprandial:   less than 180 mg/dL (1-2 hours)      Critically ill patients:  140 - 180 mg/dL   Lab Results  Component Value Date   GLUCAP 279 (H) 05/30/2020   HGBA1C 11.0 (H) 05/25/2020    Review of Glycemic Control Results for Gabriella Robinson, Gabriella Robinson (MRN [id number]) as of 05/30/2020 07:55  Ref. Range 05/29/2020 13:14 05/29/2020 17:47 05/29/2020 21:33 05/30/2020 00:16 05/30/2020 04:13  Glucose-Capillary Latest Ref Range: 70 - 99 mg/dL 206 (H) 138 (H) 225 (H) 295 (H) 279 (H)   Diabetes history: DM 2 Outpatient Diabetes medications:  Glucotrol XL 5 mg daily, Metformin 1000 mg bid Current orders for Inpatient glycemic control:  Novolog resistant q 4 hours Lantus 15 units bid Novolog 20 units bid Solumedrol 60 mg IV q 12 hours Inpatient Diabetes Program Recommendations:    Please consider increasing Lantus to 25 units bid.  Based on A1C patient will need insulin at d/c.  Will attempt to speak with patient today if appropriate.  Thanks,  Adah Perl, RN, BC-ADM Inpatient Diabetes Coordinator Pager [phone] (8a-5p)

## 2020-05-30 NOTE — Progress Notes (Addendum)
PROGRESS NOTE    Gabriella Robinson   XHB:[id number]  DOB: 07-17-1961  PCP: Casilda Carls, MD    DOA: 05/25/2020 LOS: 5   Brief Narrative   Gabriella Robinson is a 59 y.o. female with medical history of multiple sclerosis on interferon-beta, hypertension, hyperlipidemia, type 2 diabetes mellitus, migraine headache, opioid dependence, fibromyalgia, neck pain, back pain, who presented to the ED on 05/25/20 with shortness breath, cough, fever and chills, nausea, vomiting, diarrhea x 5 days after close household contact with Covid-19.  Evaluation in the ED - positive Covid-19 PCR. Initial oxygen sat 84% on room air, improved to 90's on 4 L/min.  However, by morning of 10/21, patient requiring heated HFNC at 40 L/min.  Chest xray showed multifocal infiltrates.  Pt had tachycardia, tachypnea as well, meeting criteria for sepsis due to Covid-19 pneumonia.    Initially in HHS with blood glucose of 753.  Started on insulin infusion.  Transitioned to subcutaneous insulin the next morning.    10/21-22: continues to require 40 L/min heated HF.  Hyperglycemic on high dose IV steroids, insulin increased.       Assessment & Plan   Principal Problem:   Pneumonia due to COVID-19 virus Active Problems:   Hyperosmolar hyperglycemic state (HHS) ([place])   Acute respiratory failure with hypoxia (HCC)   Severe sepsis (HCC)   Diabetes mellitus without complication (HCC)   Hypokalemia   AKI (acute kidney injury) ([place])   HTN (hypertension)   HLD (hyperlipidemia)   Multiple sclerosis (HCC)    Acute respiratory failure with hypoxia due to  Multifocal pneumonia due to COVID-19 -  present on admission Patient is requiring 40 L/min oxygen by heated HFNC.  Chest x-ray showed a bilateral infiltrates consistent with PNA.  Covid-19 PCR posititve, and known household contact.  Initial CRP 9.1.   10/21: Discussed baricitinib as another potential therapy for her worsening Covid-19 pneumonia if she has no  contraindications, but she declines it at this time.   10/22: pt feeling worse today, headache, persistent cough, hot.  Had a fever of 100.4 this morning.  CRP improved 9.1>>2.8. 10/23: still feels worse, headache, nausea, remains on 40 L/min heated HFNC 10/24: O2 requirement up slightly to 45 L/min, 80% FiO2 10/25: on 40 L/min heated HFNC, feeling slightly better.  Discussed studies of interferon use in Covid, offered to resume for her MS and for Covid, but she declines at this time. --Will extend Remdesevir to 10 days, given minimal improvement thus far (10/20 >>) --Pt declines baricitinib --Continue IV Solumedrol 60 mg bid --Procal on admission 0.14, will defer antibiotics --vitamin C, zinc --Bronchodilators, Antitussives --f/u Blood culture --follow inflammatory markers, CMP, CBC --prone or lateral decubitus positioning as much as tolerated --check BNP and procal with AM labs   Severe Sepsis due to Covid infection - POA, criteria for sepsis including tachycardia, tachypnea, and known Covid-19 infection. Normal lactic acid.  Acute kidney injury reflects end organ dysfunction.  Patient treated in the ED with IV fluids and started on above therapies for Covid-19 infection.  --Monitor closely due to high risk of clinical deterioration.  --Mgmt as above. --Stopping fluids this evening and monitor  Headache - PRN Tylenol or Fioricet. Monitor.  Lactic acidosis - present on admission, due to sepsis. Resolved.  Acute kidney injury - Likely due to dehydration and continuation of Cozaar, HCTZ, NSAIDs.  Resolved with IV hydration. --Hold ibuprofen, Mobic --Resume Cozaar, monitor BP and resume HCTZ tomorrow if indicated --daily BMP to monitor --Avoid  nephrotoxins and hypotension --off IV fluids and stable Cr  Hyperosmolar hyperglycemic state (HHS) - POA with glucose 753. Due to infection and possibly noncompliance with medication during illness.  Fluid resuscitated in the ED.  Initially on  insulin drip, transitioned to subcutaneous insulin this AM (10/21). Hyperglycemia at this point exacerbated by high dose steroids for hypoxia with Covid-19 PNA. --insulin as below --resumed on diet   Steroid-induced hyperglycemia -  Type 2 diabetes mellitus - Uncontrolled, HbgA1c is 11%.  Takes Metformin and glipizide at home.  Presented in HHS with blood sugar 753.  Remains hyperglycemic due to steroids.   --Diabetes coordinator following --increase Lantus to 25 units BID --increase mealtime Novolog to 22 units TID WC  --continue resistant sliding scale --Hbg A1c is pending   Hypokalemia - Resolved.  K 3.3 on admission, replaced in ED.  Monitor K and Mg, replace as needed.     Multiple sclerosis - on interferon-Beta injection 3 times per week.  Dr. Blaine Hamper, admitting hospitalist, discussed with patient's neurologist, Dr. Mendel Ryder by phone, recommended to hold interferon beta.   --Continue home baclofen, tizanidine   Hypertension - blood pressure 157/80 on admission.  BP has been very labile today (10/21).   --Resume losartan since AKI resolved --Continue holding HCTZ for now given low K.  Can stop amlodipine when HCTZ resumed. --Started on amlodipine on admission with other BP meds held --Hydralazine PRN   Hyperlipidemia - continue Lipitor  Insomnia - continue home trazodone    Obesity: Body mass index is 35.64 kg/m.  Complicates overall care and prognosis.  DVT prophylaxis:    Diet:  Diet Orders (From admission, onward)    Start     Ordered   05/26/20 0417  Diet heart healthy/carb modified Room service appropriate? Yes; Fluid consistency: Thin  Diet effective now       Question Answer Comment  Diet-HS Snack? Nothing   Room service appropriate? Yes   Fluid consistency: Thin      05/26/20 0416            Code Status: Full Code    Subjective 05/30/20    Pt seen up in chair this AM.  Pt states feeling very slightly better today.  Says the recliner very hard and  uncomfortable, wants to go back to the bed.  Still having headaches.  States her right leg weakness is her baseline with MS.  We discussed research studies of use of interferon-beta in Covid-19 (she takes for MS and its held).  She declines to have it restarted at this time.    Disposition Plan & Communication   Status is: Inpatient  Remains inpatient appropriate because:Inpatient level of care appropriate due to severity of illness   Dispo: The patient is from: Home              Anticipated d/c is to: Home              Anticipated d/c date is: 3 days              Patient currently is not medically stable to d/c.   Family Communication: spoke with daughter, [name], by phone 10/24.       Consults, Procedures, Significant Events   Consultants:   none  Procedures:   none  Antimicrobials:  Anti-infectives (From admission, onward)   Start     Dose/Rate Route Frequency Ordered Stop   05/26/20 1000  remdesivir 100 mg in sodium chloride 0.9 % 100 mL IVPB       "  Followed by" Linked Group Details   100 mg 200 mL/hr over 30 Minutes Intravenous Daily 05/25/20 1441 05/29/20 1020   05/25/20 1700  remdesivir 200 mg in sodium chloride 0.9% 250 mL IVPB       "Followed by" Linked Group Details   200 mg 580 mL/hr over 30 Minutes Intravenous Once 05/25/20 1441 05/25/20 1630         Objective   Vitals:   05/30/20 0431 05/30/20 0542 05/30/20 0800 05/30/20 1210  BP: 136/82  (!) 155/76   Pulse: 84  86   Resp: 18  17   Temp:   98.4 F (36.9 C)   TempSrc:   Axillary   SpO2: 96%  90% 94%  Weight:  106.3 kg    Height:        Intake/Output Summary (Last 24 hours) at 05/30/2020 1549 Last data filed at 05/30/2020 0033 Gross per 24 hour  Intake 480 ml  Output 1275 ml  Net -795 ml   Filed Weights   05/28/20 0502 05/29/20 0458 05/30/20 0542  Weight: 106.8 kg 114.8 kg 106.3 kg    Physical Exam:  General exam: sleeping but woke up to voice, ill-appearing, no acute distress,  obese Respiratory system: referred upper airway sounds, no wheezes, normal respiratory effort, on heated HFNC at 45 L/min. Cardiovascular system: normal S1/S2, RRR, no pedal edema.   Gastrointestinal: soft, non-tender Central nervous system: A&O x3. no gross focal neurologic deficits, normal speech Psychiatry: depressed mood, congruent affect, judgement and insight appear normal  Labs   Data Reviewed: I have personally reviewed following labs and imaging studies  CBC: Recent Labs  Lab 05/26/20 0431 05/27/20 0622 05/28/20 0517 05/29/20 0646 05/30/20 0734  WBC 7.3 9.7 8.3 9.7 10.7*  NEUTROABS 5.4 6.9 6.0 7.3 8.6*  HGB 11.7* 11.2* 12.5 13.5 12.1  HCT 35.5* 33.3* 37.4 41.5 36.8  MCV 85.7 86.5 84.4 86.1 86.0  PLT 410* 422* 444* 553* 433*   Basic Metabolic Panel: Recent Labs  Lab 05/26/20 0431 05/26/20 0431 05/26/20 1047 05/27/20 0622 05/28/20 0517 05/29/20 0646 05/30/20 0734  NA 145   < > 141 140 138 139 136  K 2.8*   < > 4.8 3.1* 3.8 3.6 3.6  CL 113*   < > 109 107 106 107 104  CO2 23   < > 20* 23 25 22 24   GLUCOSE 199*   < > 262* 188* 321* 145* 207*  BUN 19   < > 21* 23* 21* 21* 24*  CREATININE 0.91   < > 1.01* 0.74 0.63 0.75 0.65  CALCIUM 9.3   < > 9.2 9.1 9.4 9.5 9.1  MG 2.2  --   --  1.9 2.2 2.2 2.4   < > = values in this interval not displayed.   GFR: Estimated Creatinine Clearance: 97.9 mL/min (by C-G formula based on SCr of 0.65 mg/dL). Liver Function Tests: Recent Labs  Lab 05/26/20 0431 05/27/20 0622 05/28/20 0517 05/29/20 0646 05/30/20 0734  AST 24 24 19 20 24   ALT 21 20 20 19 26   ALKPHOS 77 65 75 72 69  BILITOT 0.5 0.9 0.7 0.6 0.7  PROT 7.1 6.5 6.9 7.3 6.7  ALBUMIN 3.1* 2.8* 3.0* 3.0* 2.7*   No results for input(s): LIPASE, AMYLASE in the last 168 hours. No results for input(s): AMMONIA in the last 168 hours. Coagulation Profile: No results for input(s): INR, PROTIME in the last 168 hours. Cardiac Enzymes: No results for input(s): CKTOTAL,  CKMB, CKMBINDEX, TROPONINI  in the last 168 hours. BNP (last 3 results) No results for input(s): PROBNP in the last 8760 hours. HbA1C: No results for input(s): HGBA1C in the last 72 hours. CBG: Recent Labs  Lab 05/29/20 1747 05/29/20 2133 05/30/20 0016 05/30/20 0413 05/30/20 0854  GLUCAP 138* 225* 295* 279* 205*   Lipid Profile: No results for input(s): CHOL, HDL, LDLCALC, TRIG, CHOLHDL, LDLDIRECT in the last 72 hours. Thyroid Function Tests: No results for input(s): TSH, T4TOTAL, FREET4, T3FREE, THYROIDAB in the last 72 hours. Anemia Panel: Recent Labs    05/29/20 0646 05/30/20 0734  FERRITIN 113 91   Sepsis Labs: Recent Labs  Lab 05/25/20 1807 05/26/20 0041 05/26/20 0431 05/27/20 0622 05/30/20 0734  PROCALCITON 0.21  --  0.14 <0.10 <0.10  LATICACIDVEN 3.3* 1.8  --   --   --     Recent Results (from the past 240 hour(s))  Respiratory Panel by RT PCR (Flu A&B, Covid) - Nasopharyngeal Swab     Status: Abnormal   Collection Time: 05/25/20  1:43 PM   Specimen: Nasopharyngeal Swab  Result Value Ref Range Status   SARS Coronavirus 2 by RT PCR POSITIVE (A) NEGATIVE Final    Comment: RESULT CALLED TO, READ BACK BY AND VERIFIED WITH: RENO, REED AT 1459 ON 05/25/20 BY SS (NOTE) SARS-CoV-2 target nucleic acids are DETECTED.  SARS-CoV-2 RNA is generally detectable in upper respiratory specimens  during the acute phase of infection. Positive results are indicative of the presence of the identified virus, but do not rule out bacterial infection or co-infection with other pathogens not detected by the test. Clinical correlation with patient history and other diagnostic information is necessary to determine patient infection status. The expected result is Negative.  Fact Sheet for Patients:  PinkCheek.be  Fact Sheet for Healthcare Providers: GravelBags.it  This test is not yet approved or cleared by the [place] FDA and  has been authorized for detection and/or diagnosis of SARS-CoV-2 by FDA under an Emergency Use Authorization (EUA).  This EUA will remain in effect (meaning this test can b e used) for the duration of  the COVID-19 declaration under Section 564(b)(1) of the Act, 21 U.S.C. section 360bbb-3(b)(1), unless the authorization is terminated or revoked sooner.      Influenza A by PCR NEGATIVE NEGATIVE Final   Influenza B by PCR NEGATIVE NEGATIVE Final    Comment: (NOTE) The Xpert Xpress SARS-CoV-2/FLU/RSV assay is intended as an aid in  the diagnosis of influenza from Nasopharyngeal swab specimens and  should not be used as a sole basis for treatment. Nasal washings and  aspirates are unacceptable for Xpert Xpress SARS-CoV-2/FLU/RSV  testing.  Fact Sheet for Patients: PinkCheek.be  Fact Sheet for Healthcare Providers: GravelBags.it  This test is not yet approved or cleared by the [place] FDA and  has been authorized for detection and/or diagnosis of SARS-CoV-2 by  FDA under an Emergency Use Authorization (EUA). This EUA will remain  in effect (meaning this test can be used) for the duration of the  Covid-19 declaration under Section 564(b)(1) of the Act, 21  U.S.C. section 360bbb-3(b)(1), unless the authorization is  terminated or revoked. Performed at [facility], [address] [place], [place] [address]   Culture, blood (Routine X 2) w Reflex to ID Panel     Status: None (Preliminary result)   Collection Time: 05/26/20 12:43 AM   Specimen: BLOOD  Result Value Ref Range Status   Specimen Description BLOOD BLOOD LEFT ARM  Final   Special Requests   Final    BOTTLES DRAWN AEROBIC AND ANAEROBIC Blood Culture results may not be optimal due to an excessive volume of blood received in culture bottles   Culture   Final    NO GROWTH 4 DAYS Performed at [facility], [address],  [place], [place] [address]    Report Status PENDING  Incomplete  Culture, blood (Routine X 2) w Reflex to ID Panel     Status: None (Preliminary result)   Collection Time: 05/26/20 12:43 AM   Specimen: BLOOD  Result Value Ref Range Status   Specimen Description BLOOD BLOOD RIGHT ARM  Final   Special Requests   Final    BOTTLES DRAWN AEROBIC AND ANAEROBIC Blood Culture results may not be optimal due to an excessive volume of blood received in culture bottles   Culture   Final    NO GROWTH 4 DAYS Performed at [facility], [address] [place], [place] [address]    Report Status PENDING  Incomplete      Imaging Studies   No results found.   Medications   Scheduled Meds: . amLODipine  5 mg Oral Daily  . vitamin C  500 mg Oral Daily  . aspirin EC  81 mg Oral Daily  . atorvastatin  10 mg Oral Daily  . baclofen  5 mg Oral QHS  . dextromethorphan-guaiFENesin  1 tablet Oral BID  . enoxaparin (LOVENOX) injection  0.5 mg/kg Subcutaneous Q24H  . feeding supplement  237 mL Oral QPM  . gabapentin  800 mg Oral BID  . influenza vac split quadrivalent PF  0.5 mL Intramuscular Tomorrow-1000  . insulin aspart  0-20 Units Subcutaneous Q4H  . insulin aspart  20 Units Subcutaneous TID WC  . insulin glargine  25 Units Subcutaneous BID  . ipratropium  2 puff Inhalation Q4H  . losartan  50 mg Oral Daily  . methylPREDNISolone (SOLU-MEDROL) injection  60 mg Intravenous Q12H  . tiZANidine  4 mg Oral TID  . topiramate  100 mg Oral BID  . traZODone  100 mg Oral QHS  . zinc sulfate  220 mg Oral Daily   Continuous Infusions:      LOS: 5 days    Time spent: 25 minutes with > 50% spent in coordination of care and direct patient contact.     Ezekiel Slocumb, DO [facility]  05/30/2020, 3:49 PM    If 7PM-7AM, please contact night-coverage. How to contact the [facility] Attending or Consulting provider [place] or covering provider during after hours [place], for this patient?      1. Check the care team in [facility] and look for a) attending/consulting TRH provider listed and b) the [facility] team listed 2. Log into [email or web address] and use [place]'s universal password to access. If you do not have the password, please contact the hospital operator. 3. Locate the [facility] provider you are looking for under [facility] and page to a number that you can be directly reached. 4. If you still have difficulty reaching the provider, please page the [facility] (Director on Call) for the Hospitalists listed on amion for assistance.

## 2020-05-30 NOTE — Progress Notes (Signed)
Physical Therapy Treatment Patient Details Name: Gabriella Robinson MRN: [id number] DOB: 1961/07/02 Today's Date: 05/30/2020    History of Present Illness Patient presents with COVID, MS, HTN, HLD, DM, Hypersosmolar hyperglycemic state, hypokalemia, AKI, sepsis, fibromyalgia, opiod dependence. At baseline is ind with most ADLs. At this time is now on high flow nasal cannula 45 L/min with Fi02 80%. Patient was independent prior to admission living with family.    PT Comments    Patient received in bed. Reports she is doing ok. Declines getting back up to recliner or sitting edge of bed at this time. Agrees to bed exercises. Performed B LE exercises with cues. Rest needed between reps due to fatigue. She will continue to benefit from skilled PT while here to improve strength and functional mobility.      Follow Up Recommendations  SNF     Equipment Recommendations  Rolling walker with 5" wheels    Recommendations for Other Services       Precautions / Restrictions Precautions Precautions: Fall Restrictions Weight Bearing Restrictions: No    Mobility  Bed Mobility               General bed mobility comments: patient declines, She states she sat up in recliner this morning.  Transfers                 General transfer comment: Patient refused  Ambulation/Gait             General Gait Details: declines   Stairs             Wheelchair Mobility    Modified Rankin (Stroke Patients Only)       Balance                                            Cognition Arousal/Alertness: Awake/alert Behavior During Therapy: WFL for tasks assessed/performed Overall Cognitive Status: Within Functional Limits for tasks assessed                                 General Comments: Patient is A and O x 4      Exercises Other Exercises Other Exercises: Long sitting LE exercises: ap, heel slides, hip abd/add, SAQ, SLR x 10 reps each  with breaks as needed.    General Comments        Pertinent Vitals/Pain Pain Assessment: No/denies pain    Home Living                      Prior Function            PT Goals (current goals can now be found in the care plan section) Acute Rehab PT Goals Patient Stated Goal: to get stronger PT Goal Formulation: With patient Time For Goal Achievement: 06/12/20 Potential to Achieve Goals: Fair Progress towards PT goals: Progressing toward goals    Frequency    Min 2X/week      PT Plan Current plan remains appropriate    Co-evaluation              AM-PAC PT "6 Clicks" Mobility   Outcome Measure  Help needed turning from your back to your side while in a flat bed without using bedrails?: A Little Help needed moving from lying on your back to  sitting on the side of a flat bed without using bedrails?: A Lot Help needed moving to and from a bed to a chair (including a wheelchair)?: A Lot Help needed standing up from a chair using your arms (e.g., wheelchair or bedside chair)?: A Lot Help needed to walk in hospital room?: A Lot Help needed climbing 3-5 steps with a railing? : A Lot 6 Click Score: 13    End of Session Equipment Utilized During Treatment: Oxygen Activity Tolerance: Patient limited by fatigue Patient left: in bed;with call bell/phone within reach;with bed alarm set Nurse Communication: Mobility status PT Visit Diagnosis: Other abnormalities of gait and mobility (R26.89);Muscle weakness (generalized) (M62.81);Difficulty in walking, not elsewhere classified (R26.2)     Time: 7209-4709 PT Time Calculation (min) (ACUTE ONLY): 18 min  Charges:  $Therapeutic Exercise: 8-22 mins                     Theodoro Koval, PT, GCS 05/30/20,3:38 PM

## 2020-05-30 NOTE — Plan of Care (Signed)

## 2020-05-31 DIAGNOSIS — E119 Type 2 diabetes mellitus without complications: Secondary | ICD-10-CM | POA: Diagnosis not present

## 2020-05-31 DIAGNOSIS — J1282 Pneumonia due to coronavirus disease 2019: Secondary | ICD-10-CM | POA: Diagnosis not present

## 2020-05-31 DIAGNOSIS — U071 COVID-19: Secondary | ICD-10-CM | POA: Diagnosis not present

## 2020-05-31 DIAGNOSIS — J9601 Acute respiratory failure with hypoxia: Secondary | ICD-10-CM | POA: Diagnosis not present

## 2020-05-31 LAB — CBC WITH DIFFERENTIAL/PLATELET
Abs Immature Granulocytes: 0.09 10*3/uL — ABNORMAL HIGH (ref 0.00–0.07)
Basophils Absolute: 0 10*3/uL (ref 0.0–0.1)
Basophils Relative: 0 %
Eosinophils Absolute: 0 10*3/uL (ref 0.0–0.5)
Eosinophils Relative: 0 %
HCT: 37 % (ref 36.0–46.0)
Hemoglobin: 11.8 g/dL — ABNORMAL LOW (ref 12.0–15.0)
Immature Granulocytes: 1 %
Lymphocytes Relative: 14 %
Lymphs Abs: 1.4 10*3/uL (ref 0.7–4.0)
MCH: 28.2 pg (ref 26.0–34.0)
MCHC: 31.9 g/dL (ref 30.0–36.0)
MCV: 88.3 fL (ref 80.0–100.0)
Monocytes Absolute: 0.6 10*3/uL (ref 0.1–1.0)
Monocytes Relative: 6 %
Neutro Abs: 7.5 10*3/uL (ref 1.7–7.7)
Neutrophils Relative %: 79 %
Platelets: 562 10*3/uL — ABNORMAL HIGH (ref 150–400)
RBC: 4.19 MIL/uL (ref 3.87–5.11)
RDW: 13.6 % (ref 11.5–15.5)
WBC: 9.5 10*3/uL (ref 4.0–10.5)
nRBC: 0 % (ref 0.0–0.2)

## 2020-05-31 LAB — PROCALCITONIN: Procalcitonin: <0.1 ng/mL

## 2020-05-31 LAB — GLUCOSE, CAPILLARY
Glucose-Capillary: 179 mg/dL — ABNORMAL HIGH (ref 70–99)
Glucose-Capillary: 214 mg/dL — ABNORMAL HIGH (ref 70–99)
Glucose-Capillary: 237 mg/dL — ABNORMAL HIGH (ref 70–99)
Glucose-Capillary: 237 mg/dL — ABNORMAL HIGH (ref 70–99)
Glucose-Capillary: 264 mg/dL — ABNORMAL HIGH (ref 70–99)
Glucose-Capillary: 312 mg/dL — ABNORMAL HIGH (ref 70–99)

## 2020-05-31 LAB — COMPREHENSIVE METABOLIC PANEL
ALT: 32 U/L (ref 0–44)
AST: 25 U/L (ref 15–41)
Albumin: 2.8 g/dL — ABNORMAL LOW (ref 3.5–5.0)
Alkaline Phosphatase: 69 U/L (ref 38–126)
Anion gap: 5 (ref 5–15)
BUN: 23 mg/dL — ABNORMAL HIGH (ref 6–20)
CO2: 28 mmol/L (ref 22–32)
Calcium: 8.9 mg/dL (ref 8.9–10.3)
Chloride: 100 mmol/L (ref 98–111)
Creatinine, Ser: 0.64 mg/dL (ref 0.44–1.00)
GFR, Estimated: >60 mL/min (ref >60)
Glucose, Bld: 230 mg/dL — ABNORMAL HIGH (ref 70–99)
Potassium: 3.8 mmol/L (ref 3.5–5.1)
Sodium: 133 mmol/L — ABNORMAL LOW (ref 135–145)
Total Bilirubin: 0.5 mg/dL (ref 0.3–1.2)
Total Protein: 6.5 g/dL (ref 6.5–8.1)

## 2020-05-31 LAB — CULTURE, BLOOD (ROUTINE X 2)
Culture: NO GROWTH
Culture: NO GROWTH

## 2020-05-31 LAB — FIBRIN DERIVATIVES D-DIMER (ARMC ONLY): Fibrin derivatives D-dimer (ARMC): 2866.85 ng/mL (FEU) — ABNORMAL HIGH (ref 0.00–499.00)

## 2020-05-31 MED ORDER — GLUCERNA SHAKE PO LIQD
237.0000 mL | Freq: Every day | ORAL | Status: DC
  Administered 2020-05-31 – 2020-06-05 (×6): 237 mL via ORAL

## 2020-05-31 NOTE — Plan of Care (Signed)

## 2020-05-31 NOTE — Plan of Care (Signed)
  Problem: Education: Goal: Knowledge of General Education information will improve Description: Including pain rating scale, medication(s)/side effects and non-pharmacologic comfort measures Outcome: Progressing   Problem: Nutrition: Goal: Adequate nutrition will be maintained Outcome: Progressing   

## 2020-05-31 NOTE — Progress Notes (Signed)
PROGRESS NOTE    Gabriella Robinson   TWK:[id number]  DOB: 10/22/1960  PCP: Casilda Carls, MD    DOA: 05/25/2020 LOS: 6   Brief Narrative   Gabriella Robinson is a 59 y.o. female with medical history of multiple sclerosis on interferon-beta, hypertension, hyperlipidemia, type 2 diabetes mellitus, migraine headache, opioid dependence, fibromyalgia, neck pain, back pain, who presented to the ED on 05/25/20 with shortness breath, cough, fever and chills, nausea, vomiting, diarrhea x 5 days after close household contact with Covid-19.  Evaluation in the ED - positive Covid-19 PCR. Initial oxygen sat 84% on room air, improved to 90's on 4 L/min.  However, by morning of 10/21, patient requiring heated HFNC at 40 L/min.  Chest xray showed multifocal infiltrates.  Pt had tachycardia, tachypnea as well, meeting criteria for sepsis due to Covid-19 pneumonia.    Initially in HHS with blood glucose of 753.  Started on insulin infusion.  Transitioned to subcutaneous insulin the next morning.    10/21-22: continues to require 40 L/min heated HF.  Hyperglycemic on high dose IV steroids, insulin increased.       Assessment & Plan   Principal Problem:   Pneumonia due to COVID-19 virus Active Problems:   Hyperosmolar hyperglycemic state (HHS) ([place])   Acute respiratory failure with hypoxia (HCC)   Severe sepsis (HCC)   Diabetes mellitus without complication (HCC)   Hypokalemia   AKI (acute kidney injury) ([place])   HTN (hypertension)   HLD (hyperlipidemia)   Multiple sclerosis (HCC)    Acute respiratory failure with hypoxia due to  Multifocal pneumonia due to COVID-19 -  present on admission Patient is requiring 40 L/min oxygen by heated HFNC.  Chest x-ray showed a bilateral infiltrates consistent with PNA.  Covid-19 PCR posititve, and known household contact.  Initial CRP 9.1.   10/21: Discussed baricitinib as another potential therapy for her worsening Covid-19 pneumonia if she has no  contraindications, but she declines it at this time.   10/22: pt feeling worse today, headache, persistent cough, hot.  Had a fever of 100.4 this morning.  CRP improved 9.1>>2.8. 10/23: still feels worse, headache, nausea, remains on 40 L/min heated HFNC 10/24: O2 requirement up slightly to 45 L/min, 80% FiO2 10/25: on 40 L/min heated HFNC, feeling slightly better.  Discussed studies of interferon use in Covid, offered to resume for her MS and for Covid, but she declines at this time.  10/26: remains on 40 L/min otherwise seems clinically improving.  --Will extend Remdesevir to 10 days, given minimal improvement thus far (10/20 >>) --Pt declines baricitinib --Continue IV Solumedrol 60 mg bid --Procal on admission 0.14, will defer antibiotics, repeats < 0.10 --vitamin C, zinc --Bronchodilators, Antitussives --f/u Blood culture --follow inflammatory markers, CMP, CBC --prone or lateral decubitus positioning as much as tolerated   Severe Sepsis due to Covid infection - POA, criteria for sepsis including tachycardia, tachypnea, and known Covid-19 infection. Normal lactic acid.  Acute kidney injury reflects end organ dysfunction.  Patient treated in the ED with IV fluids and started on above therapies for Covid-19 infection.  --Monitor closely due to high risk of clinical deterioration.  --Mgmt as above.  Headache - PRN Tylenol or Fioricet. Monitor.  Lactic acidosis - present on admission, due to sepsis. Resolved.  Acute kidney injury - Likely due to dehydration and continuation of Cozaar, HCTZ, NSAIDs.  Resolved with IV hydration. --Hold ibuprofen, Mobic --Resume Cozaar, monitor BP  --Home HCTZ still held, resume when BP will tolerate --  daily BMP to monitor --Avoid nephrotoxins and hypotension --off IV fluids and stable Cr  Hyperosmolar hyperglycemic state (HHS) - POA with glucose 753. Due to infection and possibly noncompliance with medication during illness.  Fluid resuscitated in the  ED.  Initially on insulin drip, transitioned to subcutaneous insulin this AM (10/21). Hyperglycemia at this point exacerbated by high dose steroids for hypoxia with Covid-19 PNA. --insulin as below --resumed on diet   Steroid-induced hyperglycemia -  Type 2 diabetes mellitus - Uncontrolled, HbgA1c is 11%.  Takes Metformin and glipizide at home.  Presented in HHS with blood sugar 753.  Remains hyperglycemic due to steroids.   --Diabetes coordinator following --Lantus to 25 units BID --Novolog to 20 units TID WC + resistant sliding scale --Will need insulin on discharge   Hypokalemia - Resolved.  K 3.3 on admission, replaced in ED.  Monitor K and Mg, replace as needed.   Generalized weakness - due to acute illness and underlying MS.  PT recommending SNF for rehab.   Multiple sclerosis - on interferon-Beta injection 3 times per week.  Dr. Blaine Hamper, admitting hospitalist, discussed with patient's neurologist, Dr. Mendel Ryder by phone, recommended to hold interferon beta.   --Continue home baclofen, tizanidine --Hold interferon-beta --Study regarding interferon use in Covid reviewed: small study showed reduced time to discharge, reduced mortality --Discussed with patient, she declines to resume interferon at this time   Hypertension - blood pressure 157/80 on admission.  BP has been very labile today (10/21).   --Resume losartan since AKI resolved --Continue holding HCTZ for now given low K.  Can stop amlodipine when HCTZ resumed. --Started on amlodipine on admission with other BP meds held --Hydralazine PRN   Hyperlipidemia - continue Lipitor  Insomnia - continue home trazodone    Obesity: Body mass index is 35.6 kg/m.  Complicates overall care and prognosis.  DVT prophylaxis:    Diet:  Diet Orders (From admission, onward)    Start     Ordered   05/26/20 0417  Diet heart healthy/carb modified Room service appropriate? Yes; Fluid consistency: Thin  Diet effective now       Question  Answer Comment  Diet-HS Snack? Nothing   Room service appropriate? Yes   Fluid consistency: Thin      05/26/20 0416            Code Status: Full Code    Subjective 05/31/20    Pt seen at bedside.  Reports feeling a bit better today.  Still having headaches but says they are letting up some.  Continues to have coughing fits, slightly better today.   Disposition Plan & Communication   Status is: Inpatient  Remains inpatient appropriate because:Inpatient level of care appropriate due to severity of illness.  Patient has significant oxygen requirements and remains on IV therapies as above.   Dispo: The patient is from: Home              Anticipated d/c is to: SNF              Anticipated d/c date is: 3 days              Patient currently is not medically stable to d/c.   Family Communication: spoke with daughter, [name], by phone this afternoon 10/26.    Consults, Procedures, Significant Events   Consultants:   none  Procedures:   none  Antimicrobials:  Anti-infectives (From admission, onward)   Start     Dose/Rate Route Frequency Ordered Stop  05/30/20 1815  remdesivir 100 mg in sodium chloride 0.9 % 100 mL IVPB        100 mg 200 mL/hr over 30 Minutes Intravenous Daily 05/30/20 1716 06/04/20 0959   05/26/20 1000  remdesivir 100 mg in sodium chloride 0.9 % 100 mL IVPB       "Followed by" Linked Group Details   100 mg 200 mL/hr over 30 Minutes Intravenous Daily 05/25/20 1441 05/29/20 1020   05/25/20 1700  remdesivir 200 mg in sodium chloride 0.9% 250 mL IVPB       "Followed by" Linked Group Details   200 mg 580 mL/hr over 30 Minutes Intravenous Once 05/25/20 1441 05/25/20 1630         Objective   Vitals:   05/30/20 1210 05/30/20 2101 05/31/20 0428 05/31/20 0517  BP:  137/66 123/81   Pulse:  95 74   Resp:  19 17   Temp:  98.9 F (37.2 C) 98 F (36.7 C)   TempSrc:  Oral Oral   SpO2: 94% 97% 92%   Weight:    106.2 kg  Height:         Intake/Output Summary (Last 24 hours) at 05/31/2020 0736 Last data filed at 05/30/2020 2327 Gross per 24 hour  Intake --  Output 1100 ml  Net -1100 ml   Filed Weights   05/29/20 0458 05/30/20 0542 05/31/20 0517  Weight: 114.8 kg 106.3 kg 106.2 kg    Physical Exam:  General exam: awake, alert, no acute distress, obese Respiratory system: overall clear with diminished bases, no wheezes, normal respiratory effort, on heated HFNC at 40 L/min. Cardiovascular system: normal S1/S2, RRR, no pedal edema.   Gastrointestinal: soft, non-tender Central nervous system: A&O x3. no gross focal neurologic deficits, normal speech    Labs   Data Reviewed: I have personally reviewed following labs and imaging studies  CBC: Recent Labs  Lab 05/27/20 0622 05/28/20 0517 05/29/20 0646 05/30/20 0734 05/31/20 0654  WBC 9.7 8.3 9.7 10.7* 9.5  NEUTROABS 6.9 6.0 7.3 8.6* 7.5  HGB 11.2* 12.5 13.5 12.1 11.8*  HCT 33.3* 37.4 41.5 36.8 37.0  MCV 86.5 84.4 86.1 86.0 88.3  PLT 422* 444* 553* 583* 177*   Basic Metabolic Panel: Recent Labs  Lab 05/26/20 0431 05/26/20 1047 05/27/20 0622 05/28/20 0517 05/29/20 0646 05/30/20 0734 05/31/20 0654  NA 145   < > 140 138 139 136 133*  K 2.8*   < > 3.1* 3.8 3.6 3.6 3.8  CL 113*   < > 107 106 107 104 100  CO2 23   < > 23 25 22 24 28   GLUCOSE 199*   < > 188* 321* 145* 207* 230*  BUN 19   < > 23* 21* 21* 24* 23*  CREATININE 0.91   < > 0.74 0.63 0.75 0.65 0.64  CALCIUM 9.3   < > 9.1 9.4 9.5 9.1 8.9  MG 2.2  --  1.9 2.2 2.2 2.4  --    < > = values in this interval not displayed.   GFR: Estimated Creatinine Clearance: 97.8 mL/min (by C-G formula based on SCr of 0.64 mg/dL). Liver Function Tests: Recent Labs  Lab 05/27/20 0622 05/28/20 0517 05/29/20 0646 05/30/20 0734 05/31/20 0654  AST 24 19 20 24 25   ALT 20 20 19 26  32  ALKPHOS 65 75 72 69 69  BILITOT 0.9 0.7 0.6 0.7 0.5  PROT 6.5 6.9 7.3 6.7 6.5  ALBUMIN 2.8* 3.0* 3.0* 2.7* 2.8*  No  results for input(s): LIPASE, AMYLASE in the last 168 hours. No results for input(s): AMMONIA in the last 168 hours. Coagulation Profile: No results for input(s): INR, PROTIME in the last 168 hours. Cardiac Enzymes: No results for input(s): CKTOTAL, CKMB, CKMBINDEX, TROPONINI in the last 168 hours. BNP (last 3 results) No results for input(s): PROBNP in the last 8760 hours. HbA1C: No results for input(s): HGBA1C in the last 72 hours. CBG: Recent Labs  Lab 05/30/20 0854 05/30/20 1805 05/30/20 2053 05/31/20 0017 05/31/20 0422  GLUCAP 205* 172* 208* 312* 264*   Lipid Profile: No results for input(s): CHOL, HDL, LDLCALC, TRIG, CHOLHDL, LDLDIRECT in the last 72 hours. Thyroid Function Tests: No results for input(s): TSH, T4TOTAL, FREET4, T3FREE, THYROIDAB in the last 72 hours. Anemia Panel: Recent Labs    05/29/20 0646 05/30/20 0734  FERRITIN 113 91   Sepsis Labs: Recent Labs  Lab 05/25/20 1807 05/26/20 0041 05/26/20 0431 05/27/20 0622 05/30/20 0734  PROCALCITON 0.21  --  0.14 <0.10 <0.10  LATICACIDVEN 3.3* 1.8  --   --   --     Recent Results (from the past 240 hour(s))  Respiratory Panel by RT PCR (Flu A&B, Covid) - Nasopharyngeal Swab     Status: Abnormal   Collection Time: 05/25/20  1:43 PM   Specimen: Nasopharyngeal Swab  Result Value Ref Range Status   SARS Coronavirus 2 by RT PCR POSITIVE (A) NEGATIVE Final    Comment: RESULT CALLED TO, READ BACK BY AND VERIFIED WITH: RENO, REED AT 1459 ON 05/25/20 BY SS (NOTE) SARS-CoV-2 target nucleic acids are DETECTED.  SARS-CoV-2 RNA is generally detectable in upper respiratory specimens  during the acute phase of infection. Positive results are indicative of the presence of the identified virus, but do not rule out bacterial infection or co-infection with other pathogens not detected by the test. Clinical correlation with patient history and other diagnostic information is necessary to determine patient infection  status. The expected result is Negative.  Fact Sheet for Patients:  PinkCheek.be  Fact Sheet for Healthcare Providers: GravelBags.it  This test is not yet approved or cleared by the [place] FDA and  has been authorized for detection and/or diagnosis of SARS-CoV-2 by FDA under an Emergency Use Authorization (EUA).  This EUA will remain in effect (meaning this test can b e used) for the duration of  the COVID-19 declaration under Section 564(b)(1) of the Act, 21 U.S.C. section 360bbb-3(b)(1), unless the authorization is terminated or revoked sooner.      Influenza A by PCR NEGATIVE NEGATIVE Final   Influenza B by PCR NEGATIVE NEGATIVE Final    Comment: (NOTE) The Xpert Xpress SARS-CoV-2/FLU/RSV assay is intended as an aid in  the diagnosis of influenza from Nasopharyngeal swab specimens and  should not be used as a sole basis for treatment. Nasal washings and  aspirates are unacceptable for Xpert Xpress SARS-CoV-2/FLU/RSV  testing.  Fact Sheet for Patients: PinkCheek.be  Fact Sheet for Healthcare Providers: GravelBags.it  This test is not yet approved or cleared by the [place] FDA and  has been authorized for detection and/or diagnosis of SARS-CoV-2 by  FDA under an Emergency Use Authorization (EUA). This EUA will remain  in effect (meaning this test can be used) for the duration of the  Covid-19 declaration under Section 564(b)(1) of the Act, 21  U.S.C. section 360bbb-3(b)(1), unless the authorization is  terminated or revoked. Performed at [facility], [address] [place], [place] [address]  Culture, blood (Routine X 2) w Reflex to ID Panel     Status: None   Collection Time: 05/26/20 12:43 AM   Specimen: BLOOD  Result Value Ref Range Status   Specimen Description BLOOD BLOOD LEFT ARM  Final   Special Requests   Final    BOTTLES  DRAWN AEROBIC AND ANAEROBIC Blood Culture results may not be optimal due to an excessive volume of blood received in culture bottles   Culture   Final    NO GROWTH 5 DAYS Performed at [facility], [address] [place], [place] [address]    Report Status 05/31/2020 FINAL  Final  Culture, blood (Routine X 2) w Reflex to ID Panel     Status: None   Collection Time: 05/26/20 12:43 AM   Specimen: BLOOD  Result Value Ref Range Status   Specimen Description BLOOD BLOOD RIGHT ARM  Final   Special Requests   Final    BOTTLES DRAWN AEROBIC AND ANAEROBIC Blood Culture results may not be optimal due to an excessive volume of blood received in culture bottles   Culture   Final    NO GROWTH 5 DAYS Performed at [facility], [address], [place], [place] [address]    Report Status 05/31/2020 FINAL  Final      Imaging Studies   No results found.   Medications   Scheduled Meds: . amLODipine  5 mg Oral Daily  . vitamin C  500 mg Oral Daily  . aspirin EC  81 mg Oral Daily  . atorvastatin  10 mg Oral Daily  . baclofen  5 mg Oral QHS  . dextromethorphan-guaiFENesin  1 tablet Oral BID  . enoxaparin (LOVENOX) injection  0.5 mg/kg Subcutaneous Q24H  . feeding supplement  237 mL Oral QPM  . gabapentin  800 mg Oral BID  . influenza vac split quadrivalent PF  0.5 mL Intramuscular Tomorrow-1000  . insulin aspart  0-20 Units Subcutaneous Q4H  . insulin aspart  20 Units Subcutaneous TID WC  . insulin glargine  25 Units Subcutaneous BID  . ipratropium  2 puff Inhalation Q4H  . losartan  50 mg Oral Daily  . methylPREDNISolone (SOLU-MEDROL) injection  60 mg Intravenous Q12H  . tiZANidine  4 mg Oral TID  . topiramate  100 mg Oral BID  . traZODone  100 mg Oral QHS  . zinc sulfate  220 mg Oral Daily   Continuous Infusions: . remdesivir 100 mg in NS 100 mL 100 mg (05/30/20 1817)       LOS: 6 days    Time spent: 25 minutes with > 50% spent in coordination of care and  direct patient contact.     Ezekiel Slocumb, DO [facility]  05/31/2020, 7:36 AM    If 7PM-7AM, please contact night-coverage. How to contact the [facility] Attending or Consulting provider [place] or covering provider during after hours [place], for this patient?    1. Check the care team in [facility] and look for a) attending/consulting [facility] provider listed and b) the [facility] team listed 2. Log into [email or web address] and use [place]'s universal password to access. If you do not have the password, please contact the hospital operator. 3. Locate the [employer] provider you are looking for under [facility] and page to a number that you can be directly reached. 4. If you still have difficulty reaching the provider, please page the [facility] (Director on Call) for the Hospitalists listed on amion for  assistance.

## 2020-06-01 ENCOUNTER — Inpatient Hospital Stay: Payer: Medicare Other

## 2020-06-01 ENCOUNTER — Encounter: Payer: Self-pay | Admitting: Internal Medicine

## 2020-06-01 DIAGNOSIS — I10 Essential (primary) hypertension: Secondary | ICD-10-CM | POA: Diagnosis not present

## 2020-06-01 DIAGNOSIS — R739 Hyperglycemia, unspecified: Secondary | ICD-10-CM

## 2020-06-01 DIAGNOSIS — U071 COVID-19: Secondary | ICD-10-CM | POA: Diagnosis not present

## 2020-06-01 DIAGNOSIS — J9601 Acute respiratory failure with hypoxia: Secondary | ICD-10-CM | POA: Diagnosis not present

## 2020-06-01 LAB — CBC WITH DIFFERENTIAL/PLATELET
Abs Immature Granulocytes: 0.13 10*3/uL — ABNORMAL HIGH (ref 0.00–0.07)
Basophils Absolute: 0 10*3/uL (ref 0.0–0.1)
Basophils Relative: 0 %
Eosinophils Absolute: 0.1 10*3/uL (ref 0.0–0.5)
Eosinophils Relative: 1 %
HCT: 35.2 % — ABNORMAL LOW (ref 36.0–46.0)
Hemoglobin: 11.3 g/dL — ABNORMAL LOW (ref 12.0–15.0)
Immature Granulocytes: 1 %
Lymphocytes Relative: 22 %
Lymphs Abs: 2.6 10*3/uL (ref 0.7–4.0)
MCH: 28.4 pg (ref 26.0–34.0)
MCHC: 32.1 g/dL (ref 30.0–36.0)
MCV: 88.4 fL (ref 80.0–100.0)
Monocytes Absolute: 0.9 10*3/uL (ref 0.1–1.0)
Monocytes Relative: 8 %
Neutro Abs: 8.3 10*3/uL — ABNORMAL HIGH (ref 1.7–7.7)
Neutrophils Relative %: 68 %
Platelets: 574 10*3/uL — ABNORMAL HIGH (ref 150–400)
RBC: 3.98 MIL/uL (ref 3.87–5.11)
RDW: 13.7 % (ref 11.5–15.5)
WBC: 12 10*3/uL — ABNORMAL HIGH (ref 4.0–10.5)
nRBC: 0 % (ref 0.0–0.2)

## 2020-06-01 LAB — COMPREHENSIVE METABOLIC PANEL
ALT: 33 U/L (ref 0–44)
AST: 26 U/L (ref 15–41)
Albumin: 2.7 g/dL — ABNORMAL LOW (ref 3.5–5.0)
Alkaline Phosphatase: 62 U/L (ref 38–126)
Anion gap: 9 (ref 5–15)
BUN: 20 mg/dL (ref 6–20)
CO2: 24 mmol/L (ref 22–32)
Calcium: 8.9 mg/dL (ref 8.9–10.3)
Chloride: 105 mmol/L (ref 98–111)
Creatinine, Ser: 0.58 mg/dL (ref 0.44–1.00)
GFR, Estimated: >60 mL/min (ref >60)
Glucose, Bld: 171 mg/dL — ABNORMAL HIGH (ref 70–99)
Potassium: 3.7 mmol/L (ref 3.5–5.1)
Sodium: 138 mmol/L (ref 135–145)
Total Bilirubin: 0.6 mg/dL (ref 0.3–1.2)
Total Protein: 6.1 g/dL — ABNORMAL LOW (ref 6.5–8.1)

## 2020-06-01 LAB — GLUCOSE, CAPILLARY
Glucose-Capillary: 154 mg/dL — ABNORMAL HIGH (ref 70–99)
Glucose-Capillary: 179 mg/dL — ABNORMAL HIGH (ref 70–99)
Glucose-Capillary: 187 mg/dL — ABNORMAL HIGH (ref 70–99)
Glucose-Capillary: 231 mg/dL — ABNORMAL HIGH (ref 70–99)
Glucose-Capillary: 269 mg/dL — ABNORMAL HIGH (ref 70–99)
Glucose-Capillary: 309 mg/dL — ABNORMAL HIGH (ref 70–99)

## 2020-06-01 LAB — C-REACTIVE PROTEIN: CRP: 0.6 mg/dL (ref <1.0)

## 2020-06-01 LAB — FIBRIN DERIVATIVES D-DIMER (ARMC ONLY): Fibrin derivatives D-dimer (ARMC): 2400.92 ng/mL (FEU) — ABNORMAL HIGH (ref 0.00–499.00)

## 2020-06-01 MED ORDER — MOMETASONE FURO-FORMOTEROL FUM 100-5 MCG/ACT IN AERO
2.0000 | INHALATION_SPRAY | Freq: Two times a day (BID) | RESPIRATORY_TRACT | Status: DC
  Administered 2020-06-01 – 2020-06-06 (×10): 2 via RESPIRATORY_TRACT
  Filled 2020-06-01: qty 8.8

## 2020-06-01 MED ORDER — ENOXAPARIN SODIUM 120 MG/0.8ML ~~LOC~~ SOLN
1.0000 mg/kg | Freq: Two times a day (BID) | SUBCUTANEOUS | Status: DC
  Administered 2020-06-01 – 2020-06-05 (×8): 105 mg via SUBCUTANEOUS
  Filled 2020-06-01 (×9): qty 0.8

## 2020-06-01 MED ORDER — LIVING WELL WITH DIABETES BOOK
Freq: Once | Status: AC
  Filled 2020-06-01 (×2): qty 1

## 2020-06-01 MED ORDER — FAMOTIDINE 20 MG PO TABS
20.0000 mg | ORAL_TABLET | Freq: Every day | ORAL | Status: DC
  Administered 2020-06-01 – 2020-06-06 (×6): 20 mg via ORAL
  Filled 2020-06-01 (×6): qty 1

## 2020-06-01 MED ORDER — FUROSEMIDE 10 MG/ML IJ SOLN
40.0000 mg | Freq: Once | INTRAMUSCULAR | Status: AC
  Administered 2020-06-01: 40 mg via INTRAVENOUS
  Filled 2020-06-01: qty 4

## 2020-06-01 MED ORDER — INSULIN ASPART 100 UNIT/ML ~~LOC~~ SOLN
0.0000 [IU] | Freq: Every day | SUBCUTANEOUS | Status: DC
  Administered 2020-06-01: 4 [IU] via SUBCUTANEOUS
  Administered 2020-06-02: 3 [IU] via SUBCUTANEOUS
  Administered 2020-06-03: 2 [IU] via SUBCUTANEOUS
  Administered 2020-06-04: 3 [IU] via SUBCUTANEOUS
  Administered 2020-06-05: 5 [IU] via SUBCUTANEOUS
  Filled 2020-06-01 (×5): qty 1

## 2020-06-01 MED ORDER — INSULIN ASPART 100 UNIT/ML ~~LOC~~ SOLN
0.0000 [IU] | Freq: Three times a day (TID) | SUBCUTANEOUS | Status: DC
  Administered 2020-06-01: 7 [IU] via SUBCUTANEOUS
  Administered 2020-06-02: 11 [IU] via SUBCUTANEOUS
  Administered 2020-06-02: 7 [IU] via SUBCUTANEOUS
  Administered 2020-06-02: 20 [IU] via SUBCUTANEOUS
  Administered 2020-06-03 (×3): 11 [IU] via SUBCUTANEOUS
  Administered 2020-06-04: 7 [IU] via SUBCUTANEOUS
  Administered 2020-06-04: 20 [IU] via SUBCUTANEOUS
  Administered 2020-06-04: 11 [IU] via SUBCUTANEOUS
  Administered 2020-06-05: 4 [IU] via SUBCUTANEOUS
  Administered 2020-06-05: 7 [IU] via SUBCUTANEOUS
  Administered 2020-06-05: 15 [IU] via SUBCUTANEOUS
  Administered 2020-06-06 (×2): 4 [IU] via SUBCUTANEOUS
  Administered 2020-06-06: 11 [IU] via SUBCUTANEOUS
  Filled 2020-06-01 (×16): qty 1

## 2020-06-01 MED ORDER — INSULIN STARTER KIT- PEN NEEDLES (ENGLISH)
1.0000 | Freq: Once | Status: AC
  Administered 2020-06-02: 1
  Filled 2020-06-01: qty 1

## 2020-06-01 MED ORDER — IOHEXOL 350 MG/ML SOLN
100.0000 mL | Freq: Once | INTRAVENOUS | Status: AC | PRN
  Administered 2020-06-01: 100 mL via INTRAVENOUS

## 2020-06-01 MED ORDER — PHENOL 1.4 % MT LIQD
1.0000 | OROMUCOSAL | Status: DC | PRN
  Administered 2020-06-01: 1 via OROMUCOSAL
  Filled 2020-06-01 (×2): qty 177

## 2020-06-01 MED ORDER — POLYETHYLENE GLYCOL 3350 17 G PO PACK
17.0000 g | PACK | Freq: Every day | ORAL | Status: DC
  Administered 2020-06-01 – 2020-06-06 (×6): 17 g via ORAL
  Filled 2020-06-01 (×6): qty 1

## 2020-06-01 MED ORDER — SENNA 8.6 MG PO TABS
2.0000 | ORAL_TABLET | Freq: Every day | ORAL | Status: DC
  Administered 2020-06-01 – 2020-06-06 (×6): 17.2 mg via ORAL
  Filled 2020-06-01 (×6): qty 2

## 2020-06-01 MED ORDER — MOMETASONE FURO-FORMOTEROL FUM 200-5 MCG/ACT IN AERO
2.0000 | INHALATION_SPRAY | Freq: Two times a day (BID) | RESPIRATORY_TRACT | Status: DC
  Filled 2020-06-01: qty 8.8

## 2020-06-01 NOTE — Progress Notes (Signed)
PHARMACIST - PHYSICIAN COMMUNICATION  DESCRIPTION: Dulera (mometasone/formoterol) inhaler  Dulera 200-5 mcg inhalers on are national backorder with no release date scheduled. A supply of Dulera 100-5 mcg inhalers are in stock in the pharmacy and will be substituted for inpatients.  This patient's order has been changed to [facility] (mometasone/formoterol) 100-5 mcg inhaler. Please notify pharmacy if an alternative agent is necessary.   Mills Koller, PharmD Clinical Pharmacist  06/01/2020 1:21 PM

## 2020-06-01 NOTE — Progress Notes (Addendum)
PROGRESS NOTE    Gabriella Robinson   RXV:[id number]  DOB: 1961/07/19  PCP: Casilda Carls, MD    DOA: 05/25/2020 LOS: 7   Brief Narrative   Gabriella Robinson is a 59 y.o. female with medical history of multiple sclerosis on interferon-beta, hypertension, hyperlipidemia, type 2 diabetes mellitus, migraine headache, opioid dependence, fibromyalgia, neck pain, back pain, who presented to the ED on 05/25/20 with shortness breath, cough, fever and chills, nausea, vomiting, diarrhea x 5 days after close household contact with Covid-19.  Evaluation in the ED - positive Covid-19 PCR. Initial oxygen sat 84% on room air, improved to 90's on 4 L/min.  However, by morning of 10/21, patient requiring heated HFNC at 40 L/min.  Chest xray showed multifocal infiltrates.  Pt had tachycardia, tachypnea as well, meeting criteria for sepsis due to Covid-19 pneumonia.    Initially in HHS with blood glucose of 753.  Started on insulin infusion.  Transitioned to subcutaneous insulin the next morning.        Assessment & Plan   Acute Hypoxic Resp. Failure/Pneumonia due to COVID-19   Recent Labs  Lab 05/25/20 1807 05/25/20 1807 05/26/20 0431 05/27/20 0622 05/27/20 0622 05/28/20 0517 05/29/20 0646 05/30/20 0734 05/31/20 0654 06/01/20 0550  FERRITIN 245   < > 185 134  --  116 113 91  --   --   CRP 9.1*   < >  --  2.8*  --  4.8* 1.6* 0.9  --  0.6  ALT 23   < > 21 20   < > 20 19 26  32 33  PROCALCITON 0.21  --  0.14 <0.10  --   --   --  <0.10 <0.10  --    < > = values in this interval not displayed.    D Dimer at [facility] ng/ml: [phone]   Objective findings: Oxygen requirements: Patient remains on high flow nasal cannula 4 L/min.  Saturating in the line was.  COVID 19 Therapeutics: Antibacterials: None.  Procalcitonin less than 0.1 Remdesivir:-Course extended to 10 days.  Currently day 8. Steroids: Remains on Solu-Medrol 60 mg every 12 hours Diuretics: Will  give 1 dose of furosemide today Inhaled Steroids: Initiate inhaled steroids Actemra/Baricitinib: Patient declined baricitinib PUD Prophylaxis: Initiate Pepcid DVT Prophylaxis:  Lovenox once a day  From a respiratory standpoint patient remains tenuous.  Still on high amounts of oxygen.  Patient is now on a 10-day course of Remdesivir.  She declined baricitinib.  We will give her furosemide.  Continue with incentive spirometry mobilization prone positioning as much as possible.  Add inhaled steroids.  Patient's D-dimer noted to be significantly elevated a few days ago.  It does not appear that she underwent any work-up for venous thromboembolism.  Proceed with venous Dopplers of the lower extremities.  Also consider CT angiogram chest.  ADDENDUM CTA was positive for PE. Patient to be started on therapeutic Lovenox.  CRP was elevated but has improved.  The treatment plan and use of medications and known side effects were discussed with patient/family. Some of the medications used are based on case reports/anecdotal data.  All other medications being used in the management of COVID-19 based on limited study data.  Complete risks and long-term side effects are unknown, however in the best clinical judgment they seem to be of some benefit.  Patient/family wanted to proceed with treatment options provided.  Severe Sepsis due to Covid infection POA, criteria for sepsis including tachycardia, tachypnea, and known Covid-19 infection. Normal  lactic acid.  Acute kidney injury reflects end organ dysfunction.  Patient treated in the ED with IV fluids and started on above therapies for Covid-19 infection.   Acute kidney injury Present at admission.  Likely due to hypovolemia along with the use of ARB and HCTZ.  Resolved with IV hydration.  Continue to hold nephrotoxic agents.  Creatinine has been stable the last several days.    Hyperosmolar hyperglycemic state (HHS) POA/diabetes mellitus type 2 in  obese Presented with glucose 753. Due to infection and possibly noncompliance with medication during illness.  Delay required insulin infusion.  Transition to subcutaneous insulin.  Monitor CBGs.  Continue SSI.  Steroids also contributing to hyperglycemia.  HbA1c 11.  Patient takes Metformin and glipizide at home.  Hypokalemia Potassium noted to be 3.7 today.  Magnesium was 2.42 days ago.  History of multiple sclerosis Patient on beta interferon injections 3 times a week.  Currently on hold.  Also noted to be on baclofen and tizanidine.  --Study regarding interferon use in Covid reviewed: small study showed reduced time to discharge, reduced mortality --Discussed with patient, she declines to resume interferon at this time   Essential hypertension Losartan was resumed since kidney function had improved.  Currently on amlodipine as well.  On HCTZ at home which is currently on hold.  Hyperlipidemia Continue Lipitor.  Insomnia Continue home trazodone  Obesity Estimated body mass index is 35.52 kg/m as calculated from the following:   Height as of this encounter: 5\' 8"  (1.727 m).   Weight as of this encounter: 106 kg.   DVT prophylaxis: Lovenox   Diet:  Diet Orders (From admission, onward)    Start     Ordered   05/26/20 0417  Diet heart healthy/carb modified Room service appropriate? Yes; Fluid consistency: Thin  Diet effective now       Question Answer Comment  Diet-HS Snack? Nothing   Room service appropriate? Yes   Fluid consistency: Thin      05/26/20 0416            Code Status: Full Code    Subjective 06/01/20    Patient denies any chest pain.  Continues to have difficulty breathing.  No nausea vomiting.  Continues to have a dry cough.   Disposition Plan & Communication   Status is: Inpatient  Remains inpatient appropriate because:Inpatient level of care appropriate due to severity of illness.  Patient has significant oxygen requirements and remains on IV  therapies as above.   Dispo: The patient is from: Home              Anticipated d/c is to: SNF              Anticipated d/c date is: 3 days              Patient currently is not medically stable to d/c.   Family Communication: We will update family later today.  Patient's daughter [name] was updated. She and her sister were quite confrontational. They were demanding to know why the patient was requiring such high amounts of oxygen. I did my best explain the disease process to them.   They then accused me of lying to them about patient being on Lovenox.  The differences between prophylactic and therapeutic doses was explained to them.       Consults, Procedures, Significant Events   Consultants:   none  Procedures:   none  Antimicrobials:  Anti-infectives (From admission, onward)   Start  Dose/Rate Route Frequency Ordered Stop   05/30/20 1815  remdesivir 100 mg in sodium chloride 0.9 % 100 mL IVPB        100 mg 200 mL/hr over 30 Minutes Intravenous Daily 05/30/20 1716 06/04/20 0959   05/26/20 1000  remdesivir 100 mg in sodium chloride 0.9 % 100 mL IVPB       "Followed by" Linked Group Details   100 mg 200 mL/hr over 30 Minutes Intravenous Daily 05/25/20 1441 05/29/20 1020   05/25/20 1700  remdesivir 200 mg in sodium chloride 0.9% 250 mL IVPB       "Followed by" Linked Group Details   200 mg 580 mL/hr over 30 Minutes Intravenous Once 05/25/20 1441 05/25/20 1630         Objective   Vitals:   06/01/20 0038 06/01/20 0433 06/01/20 0616 06/01/20 0741  BP: 139/79 120/72  (!) 116/55  Pulse: 87 79  90  Resp: 18 18  20   Temp: 98.2 F (36.8 C) 97.9 F (36.6 C)  98.5 F (36.9 C)  TempSrc: Oral Oral  Oral  SpO2: 100% 100% 97% 95%  Weight:  106 kg    Height:        Intake/Output Summary (Last 24 hours) at 06/01/2020 1224 Last data filed at 06/01/2020 1000 Gross per 24 hour  Intake 760 ml  Output 2350 ml  Net -1590 ml   Filed Weights   05/30/20 0542 05/31/20  0517 06/01/20 0433  Weight: 106.3 kg 106.2 kg 106 kg    Physical Exam:  General appearance: Awake alert.  In no distress Resp: Tachypneic at rest.  Coarse breath sounds bilaterally with crackles at the bases.  No wheezing or rhonchi. Cardio: S1-S2 is normal regular.  No S3-S4.  No rubs murmurs or bruit GI: Abdomen is soft.  Nontender nondistended.  Bowel sounds are present normal.  No masses organomegaly Extremities: No edema.  Full range of motion of lower extremities. Neurologic: Alert and oriented x3.  No focal neurological deficits.     Labs   Data Reviewed: I have personally reviewed following labs and imaging studies  CBC: Recent Labs  Lab 05/28/20 0517 05/29/20 0646 05/30/20 0734 05/31/20 0654 06/01/20 0550  WBC 8.3 9.7 10.7* 9.5 12.0*  NEUTROABS 6.0 7.3 8.6* 7.5 8.3*  HGB 12.5 13.5 12.1 11.8* 11.3*  HCT 37.4 41.5 36.8 37.0 35.2*  MCV 84.4 86.1 86.0 88.3 88.4  PLT 444* 553* 583* 562* 008*   Basic Metabolic Panel: Recent Labs  Lab 05/26/20 0431 05/26/20 1047 05/27/20 0622 05/27/20 0622 05/28/20 0517 05/29/20 0646 05/30/20 0734 05/31/20 0654 06/01/20 0550  NA 145   < > 140   < > 138 139 136 133* 138  K 2.8*   < > 3.1*   < > 3.8 3.6 3.6 3.8 3.7  CL 113*   < > 107   < > 106 107 104 100 105  CO2 23   < > 23   < > 25 22 24 28 24   GLUCOSE 199*   < > 188*   < > 321* 145* 207* 230* 171*  BUN 19   < > 23*   < > 21* 21* 24* 23* 20  CREATININE 0.91   < > 0.74   < > 0.63 0.75 0.65 0.64 0.58  CALCIUM 9.3   < > 9.1   < > 9.4 9.5 9.1 8.9 8.9  MG 2.2  --  1.9  --  2.2 2.2 2.4  --   --    < > =  values in this interval not displayed.   GFR: Estimated Creatinine Clearance: 97.7 mL/min (by C-G formula based on SCr of 0.58 mg/dL). Liver Function Tests: Recent Labs  Lab 05/28/20 0517 05/29/20 0646 05/30/20 0734 05/31/20 0654 06/01/20 0550  AST 19 20 24 25 26   ALT 20 19 26  32 33  ALKPHOS 75 72 69 69 62  BILITOT 0.7 0.6 0.7 0.5 0.6  PROT 6.9 7.3 6.7 6.5 6.1*    ALBUMIN 3.0* 3.0* 2.7* 2.8* 2.7*   CBG: Recent Labs  Lab 05/31/20 1532 05/31/20 2015 06/01/20 0010 06/01/20 0424 06/01/20 0738  GLUCAP 214* 237* 179* 187* 154*   Anemia Panel: Recent Labs    05/30/20 0734  FERRITIN 91   Sepsis Labs: Recent Labs  Lab 05/25/20 1807 05/25/20 1807 05/26/20 0041 05/26/20 0431 05/27/20 0622 05/30/20 0734 05/31/20 0654  PROCALCITON 0.21   < >  --  0.14 <0.10 <0.10 <0.10  LATICACIDVEN 3.3*  --  1.8  --   --   --   --    < > = values in this interval not displayed.    Recent Results (from the past 240 hour(s))  Respiratory Panel by RT PCR (Flu A&B, Covid) - Nasopharyngeal Swab     Status: Abnormal   Collection Time: 05/25/20  1:43 PM   Specimen: Nasopharyngeal Swab  Result Value Ref Range Status   SARS Coronavirus 2 by RT PCR POSITIVE (A) NEGATIVE Final    Comment: RESULT CALLED TO, READ BACK BY AND VERIFIED WITH: RENO, REED AT 1459 ON 05/25/20 BY SS (NOTE) SARS-CoV-2 target nucleic acids are DETECTED.  SARS-CoV-2 RNA is generally detectable in upper respiratory specimens  during the acute phase of infection. Positive results are indicative of the presence of the identified virus, but do not rule out bacterial infection or co-infection with other pathogens not detected by the test. Clinical correlation with patient history and other diagnostic information is necessary to determine patient infection status. The expected result is Negative.  Fact Sheet for Patients:  PinkCheek.be  Fact Sheet for Healthcare Providers: GravelBags.it  This test is not yet approved or cleared by the [place] FDA and  has been authorized for detection and/or diagnosis of SARS-CoV-2 by FDA under an Emergency Use Authorization (EUA).  This EUA will remain in effect (meaning this test can b e used) for the duration of  the COVID-19 declaration under Section 564(b)(1) of the Act, 21 U.S.C.  section 360bbb-3(b)(1), unless the authorization is terminated or revoked sooner.      Influenza A by PCR NEGATIVE NEGATIVE Final   Influenza B by PCR NEGATIVE NEGATIVE Final    Comment: (NOTE) The Xpert Xpress SARS-CoV-2/FLU/RSV assay is intended as an aid in  the diagnosis of influenza from Nasopharyngeal swab specimens and  should not be used as a sole basis for treatment. Nasal washings and  aspirates are unacceptable for Xpert Xpress SARS-CoV-2/FLU/RSV  testing.  Fact Sheet for Patients: PinkCheek.be  Fact Sheet for Healthcare Providers: GravelBags.it  This test is not yet approved or cleared by the [place] FDA and  has been authorized for detection and/or diagnosis of SARS-CoV-2 by  FDA under an Emergency Use Authorization (EUA). This EUA will remain  in effect (meaning this test can be used) for the duration of the  Covid-19 declaration under Section 564(b)(1) of the Act, 21  U.S.C. section 360bbb-3(b)(1), unless the authorization is  terminated or revoked. Performed at [facility], [address] [place], [place] [address]  Culture, blood (Routine X 2) w Reflex to ID Panel     Status: None   Collection Time: 05/26/20 12:43 AM   Specimen: BLOOD  Result Value Ref Range Status   Specimen Description BLOOD BLOOD LEFT ARM  Final   Special Requests   Final    BOTTLES DRAWN AEROBIC AND ANAEROBIC Blood Culture results may not be optimal due to an excessive volume of blood received in culture bottles   Culture   Final    NO GROWTH 5 DAYS Performed at [facility], [address] [place], [place] [address]    Report Status 05/31/2020 FINAL  Final  Culture, blood (Routine X 2) w Reflex to ID Panel     Status: None   Collection Time: 05/26/20 12:43 AM   Specimen: BLOOD  Result Value Ref Range Status   Specimen Description BLOOD BLOOD RIGHT ARM  Final   Special Requests   Final    BOTTLES  DRAWN AEROBIC AND ANAEROBIC Blood Culture results may not be optimal due to an excessive volume of blood received in culture bottles   Culture   Final    NO GROWTH 5 DAYS Performed at [facility], [address], [place], [place] [address]    Report Status 05/31/2020 FINAL  Final      Imaging Studies   No results found.   Medications   Scheduled Meds: . amLODipine  5 mg Oral Daily  . vitamin C  500 mg Oral Daily  . aspirin EC  81 mg Oral Daily  . atorvastatin  10 mg Oral Daily  . baclofen  5 mg Oral QHS  . dextromethorphan-guaiFENesin  1 tablet Oral BID  . enoxaparin (LOVENOX) injection  0.5 mg/kg Subcutaneous Q24H  . feeding supplement (GLUCERNA SHAKE)  237 mL Oral q1600  . gabapentin  800 mg Oral BID  . influenza vac split quadrivalent PF  0.5 mL Intramuscular Tomorrow-1000  . insulin aspart  0-20 Units Subcutaneous Q4H  . insulin aspart  20 Units Subcutaneous TID WC  . insulin glargine  25 Units Subcutaneous BID  . ipratropium  2 puff Inhalation Q4H  . losartan  50 mg Oral Daily  . methylPREDNISolone (SOLU-MEDROL) injection  60 mg Intravenous Q12H  . polyethylene glycol  17 g Oral Daily  . senna  2 tablet Oral Daily  . tiZANidine  4 mg Oral TID  . topiramate  100 mg Oral BID  . traZODone  100 mg Oral QHS  . zinc sulfate  220 mg Oral Daily   Continuous Infusions: . remdesivir 100 mg in NS 100 mL Stopped (06/01/20 1100)       LOS: 7 days   Bonnielee Haff,  [facility]  06/01/2020, 12:24 PM    If 7PM-7AM, please contact night-coverage. How to contact the [facility] Attending or Consulting provider [place] or covering provider during after hours [place], for this patient?    1. Check the care team in [facility] and look for a) attending/consulting [facility] provider listed and b) the [facility] team listed 2. Log into [email or web address] and use [place]'s universal password to access. If you do not have the password, please contact the hospital operator. 3. Locate the  [facility] provider you are looking for under [facility] and page to a number that you can be directly reached. 4. If you still have difficulty reaching the provider, please page the [facility] (Director on Call) for the Hospitalists listed on amion for assistance.

## 2020-06-01 NOTE — Evaluation (Signed)
Physical Therapy Evaluation Patient Details Name: Gabriella Robinson MRN: [id number] DOB: 10-24-1960 Today's Date: 06/01/2020   History of Present Illness  59 y/o female here with COVID.  History includes MS, HTN, HLD, DM, Hypersosmolar hyperglycemic state, hypokalemia, AKI, sepsis, fibromyalgia, opiod dependence. At baseline is ind with most ADLs. Has required high flow nasal cannula t/o hospitalization. Patient was independent prior to admission living with family.  Clinical Impression  Pt showed some lethargy t/o the session, but ultimately showed good effort and was willing, even eager, to work with PT.  She was overall quite limited as she required prolonged rest breaks after even modest activity.  On arrival O2 was in the low 90s/high 80s range on HFNC.  She was able to maintain low/mid 90s with focused breathing but clearly was fatigued and labored with this t/o the session.  Pt was able to rise to standing w/o assist and get to the bed side commode (had removed pure wick to be able to participate with mobility, standing, gait, etc with PT).  After prolonged toileting (cues for focused breathing t/o to keep sats in the high 80s while sitting) she attempted to stand and transfer back to bed unsuccessfully and had LOB.  Pt was able to assist PT in getting her into recliner and after prolonged rest break (with relatively stable vitals and no c/o pain) we were able to use walker to stand and turn back to the bed.      Follow Up Recommendations SNF    Equipment Recommendations  Rolling walker with 5" wheels    Recommendations for Other Services       Precautions / Restrictions Precautions Precautions: Fall Restrictions Weight Bearing Restrictions: No      Mobility  Bed Mobility Overal bed mobility: Modified Independent;Needs Assistance Bed Mobility: Supine to Sit;Sit to Supine     Supine to sit: Min guard Sit to supine: Min assist   General bed mobility comments: Pt able to get  herself slowly but w/o assist up to sitting EOB, light assist with LEs to get back into bed post session    Transfers Overall transfer level: Needs assistance Equipment used: None;Rolling walker (2 wheeled) Transfers: Sit to/from Stand Sit to Stand: Min guard Stand pivot transfers: Min assist       General transfer comment: Pt heavily reliant on UEs, but able to get sitting to standing multiple times during session w/o direct phyiscal assist.  Did give light assist with standing on final effort  Ambulation/Gait             General Gait Details: deferred any prolonged ambulation, able to take a few steps to transfer to/from [facility] at EOB but limited overall tolerance to any activity with O2 staying in the mid 80s on 40% HHFNC most of the session with consistent c/o fatigue  Stairs            Wheelchair Mobility    Modified Rankin (Stroke Patients Only)       Balance Overall balance assessment: Needs assistance Sitting-balance support: Bilateral upper extremity supported Sitting balance-Leahy Scale: Good     Standing balance support: Bilateral upper extremity supported;No upper extremity supported Standing balance-Leahy Scale: Fair Standing balance comment: Pt inconsistent with standing balance, better with walker use, one LOB                             Pertinent Vitals/Pain Pain Assessment: No/denies pain    Home  Living                        Prior Function                 Hand Dominance        Extremity/Trunk Assessment                Communication      Cognition Arousal/Alertness: Awake/alert Behavior During Therapy: WFL for tasks assessed/performed Overall Cognitive Status: Within Functional Limits for tasks assessed                                 General Comments: A&Ox4, seemed to have slow response to most questions, etc      General Comments      Exercises     Assessment/Plan    PT  Assessment    PT Problem List         PT Treatment Interventions      PT Goals (Current goals can be found in the Care Plan section)       Frequency Min 2X/week   Barriers to discharge        Co-evaluation               AM-PAC PT "6 Clicks" Mobility  Outcome Measure Help needed turning from your back to your side while in a flat bed without using bedrails?: A Little Help needed moving from lying on your back to sitting on the side of a flat bed without using bedrails?: A Lot Help needed moving to and from a bed to a chair (including a wheelchair)?: A Lot Help needed standing up from a chair using your arms (e.g., wheelchair or bedside chair)?: A Lot Help needed to walk in hospital room?: A Lot Help needed climbing 3-5 steps with a railing? : Total 6 Click Score: 12    End of Session Equipment Utilized During Treatment: Oxygen (HFNC) Activity Tolerance: Patient limited by fatigue Patient left: in bed;with call bell/phone within reach;with bed alarm set;with nursing/sitter in room Nurse Communication: Mobility status PT Visit Diagnosis: Other abnormalities of gait and mobility (R26.89);Muscle weakness (generalized) (M62.81);Difficulty in walking, not elsewhere classified (R26.2)    Time: 0938-1829 PT Time Calculation (min) (ACUTE ONLY): 47 min   Charges:     PT Treatments $Therapeutic Activity: 38-52 mins        Kreg Shropshire, DPT 06/01/2020, 4:48 PM

## 2020-06-01 NOTE — Progress Notes (Signed)
Inpatient Diabetes Program Recommendations  AACE/ADA: New Consensus Statement on Inpatient Glycemic Control (2015)  Target Ranges:  Prepandial:   less than 140 mg/dL      Peak postprandial:   less than 180 mg/dL (1-2 hours)      Critically ill patients:  140 - 180 mg/dL   Lab Results  Component Value Date   GLUCAP 269 (H) 06/01/2020   HGBA1C 11.0 (H) 05/25/2020    Review of Glycemic Control  Results for Gabriella, Robinson (MRN [id number]) as of 06/01/2020 13:11  Ref. Range 05/31/2020 20:15 06/01/2020 00:10 06/01/2020 04:24 06/01/2020 07:38 06/01/2020 12:29  Glucose-Capillary Latest Ref Range: 70 - 99 mg/dL 237 (H) 179 (H) 187 (H) 154 (H) 269 (H)  Diabetes history: DM 2 Outpatient Diabetes medications:  Glucotrol XL 5 mg daily, Metformin 1000 mg bid Current orders for Inpatient glycemic control:  Novolog resistant tid with meals and HS Lantus 25 units bid Novolog 20 units bid Solumedrol 60 mg IV q 12 hours Inpatient Diabetes Program Recommendations:    Attempted to call patient regarding elevated A1C and need for insulin. No answer.  Note that patient is still on HFNC, therefore will continue to follow and speak with patient when appropriate.   Ordered Living well with DM booklet, Insulin starter kit and insulin teaching by bedside RN when appropriate.  Will follow.   Thanks Adah Perl, RN, BC-ADM Inpatient Diabetes Coordinator Pager [phone] (8a-5p)

## 2020-06-01 NOTE — Progress Notes (Signed)
[place] for Lovenox Indication: pulmonary embolus  Patient Measurements: Height: 5\' 8"  (172.7 cm) Weight: 106 kg (233 lb 9.6 oz) IBW/kg (Calculated) : 63.9  Labs: Recent Labs    05/30/20 0734 05/30/20 0734 05/31/20 0654 06/01/20 0550  HGB 12.1   < > 11.8* 11.3*  HCT 36.8  --  37.0 35.2*  PLT 583*  --  562* 574*  CREATININE 0.65  --  0.64 0.58   < > = values in this interval not displayed.    Estimated Creatinine Clearance: 97.7 mL/min (by C-G formula based on SCr of 0.58 mg/dL).   Medical History: Past Medical History:  Diagnosis Date  . Chronic back pain   . Diabetes ([place])   . HBP (high blood pressure)   . History of blood clots   . Lumbar canal stenosis 11/12/2013  . Migraines   . MS (multiple sclerosis) ([place])   . Sinus complaint   . Sleep apnea     Assessment: 59yo female with medical history ofmultiple sclerosis on interferon-beta, HTN, HLD, T2DM, migraine headache, opioid dependence, fibromyalgia, neck pain, back pain, who presented to the ED on 05/25/20 with shortness breath - patient is COVID-19 positive. 10/27 CT chest revealed subsegmental PE involving R and L lower lobe. H/H slightly low, but stable, Plt 574, D-dimer trending down. Patient was previously on prophylactic dosing of lovenox (0.5mg /kg given BMI >30). Pharmacy has been consulted for lovenox dosing for PE.     Plan:  Start lovenox 105mg  SQ q12h Monitor CBC per protocol   Sherilyn Banker, PharmD Pharmacy Resident  06/01/2020 4:40 PM

## 2020-06-01 NOTE — Progress Notes (Signed)
This note also relates to the following rows which could not be included: ECG Heart Rate - Cannot attach notes to unvalidated device data    06/01/20 1545  What Happened  Was fall witnessed? Yes  Who witnessed fall? PT  Patients activity before fall other (comment) (Pt witnessed the fall)  Point of contact buttocks  Was patient injured? No  Follow Up  MD notified Dr. Maryland Pink  Time MD notified [phone]  Family notified Yes - comment  Time family notified 1548  Additional tests No  Adult Fall Risk Assessment  Risk Factor Category (scoring not indicated) Not Applicable  Age 59  Fall History: Fall within 6 months prior to admission 0  Elimination; Bowel and/or Urine Incontinence 0  Elimination; Bowel and/or Urine Urgency/Frequency 2  Medications: includes PCA/Opiates, Anti-convulsants, Anti-hypertensives, Diuretics, Hypnotics, Laxatives, Sedatives, and Psychotropics 5  Patient Care Equipment 1  Mobility-Assistance 2  Mobility-Gait 2  Mobility-Sensory Deficit 0  Altered awareness of immediate physical environment 0  Impulsiveness 0  Lack of understanding of one's physical/cognitive limitations 0  Total Score 12  Patient Fall Risk Level Moderate fall risk  Adult Fall Risk Interventions  Required Bundle Interventions *See Row Information* Moderate fall risk - low and moderate requirements implemented  Additional Interventions Use of appropriate toileting equipment (bedpan, BSC, etc.)  Screening for Fall Injury Risk (To be completed on HIGH fall risk patients) - Assessing Need for Low Bed  Risk For Fall Injury- Low Bed Criteria None identified - Continue screening  Screening for Fall Injury Risk (To be completed on HIGH fall risk patients who do not meet crieteria for Low Bed) - Assessing Need for Floor Mats Only  Risk For Fall Injury- Criteria for Floor Mats None identified - No additional interventions needed  Neurological  Neuro (WDL) WDL  Level of Consciousness Alert  Glasgow Coma  Scale  Eye Opening 4  Best Verbal Response (NON-intubated) 5  Best Motor Response 6  Glasgow Coma Scale Score 15  Musculoskeletal  Musculoskeletal (WDL) X  Assistive Device Four wheel walker  Generalized Weakness Yes  Integumentary  Integumentary (WDL) WDL   Pt had fallen while working with PT. Pt stated she didn't hit her head and was not in any pain. Pt stated that her socks had slipped from up under her. Pt dtr [facility] made aware.

## 2020-06-02 ENCOUNTER — Inpatient Hospital Stay: Admit: 2020-06-02 | Payer: Medicare Other

## 2020-06-02 ENCOUNTER — Encounter: Payer: Self-pay | Admitting: Internal Medicine

## 2020-06-02 DIAGNOSIS — J9601 Acute respiratory failure with hypoxia: Secondary | ICD-10-CM | POA: Diagnosis not present

## 2020-06-02 DIAGNOSIS — I10 Essential (primary) hypertension: Secondary | ICD-10-CM | POA: Diagnosis not present

## 2020-06-02 DIAGNOSIS — R739 Hyperglycemia, unspecified: Secondary | ICD-10-CM | POA: Diagnosis not present

## 2020-06-02 DIAGNOSIS — U071 COVID-19: Secondary | ICD-10-CM | POA: Diagnosis not present

## 2020-06-02 LAB — CBC WITH DIFFERENTIAL/PLATELET
Abs Immature Granulocytes: 0.11 10*3/uL — ABNORMAL HIGH (ref 0.00–0.07)
Basophils Absolute: 0 10*3/uL (ref 0.0–0.1)
Basophils Relative: 0 %
Eosinophils Absolute: 0 10*3/uL (ref 0.0–0.5)
Eosinophils Relative: 0 %
HCT: 36.5 % (ref 36.0–46.0)
Hemoglobin: 11.7 g/dL — ABNORMAL LOW (ref 12.0–15.0)
Immature Granulocytes: 1 %
Lymphocytes Relative: 22 %
Lymphs Abs: 2.7 10*3/uL (ref 0.7–4.0)
MCH: 28.5 pg (ref 26.0–34.0)
MCHC: 32.1 g/dL (ref 30.0–36.0)
MCV: 88.8 fL (ref 80.0–100.0)
Monocytes Absolute: 1.3 10*3/uL — ABNORMAL HIGH (ref 0.1–1.0)
Monocytes Relative: 10 %
Neutro Abs: 8.3 10*3/uL — ABNORMAL HIGH (ref 1.7–7.7)
Neutrophils Relative %: 67 %
Platelets: 575 10*3/uL — ABNORMAL HIGH (ref 150–400)
RBC: 4.11 MIL/uL (ref 3.87–5.11)
RDW: 13.9 % (ref 11.5–15.5)
WBC: 12.4 10*3/uL — ABNORMAL HIGH (ref 4.0–10.5)
nRBC: 0 % (ref 0.0–0.2)

## 2020-06-02 LAB — COMPREHENSIVE METABOLIC PANEL
ALT: 31 U/L (ref 0–44)
AST: 18 U/L (ref 15–41)
Albumin: 2.8 g/dL — ABNORMAL LOW (ref 3.5–5.0)
Alkaline Phosphatase: 70 U/L (ref 38–126)
Anion gap: 8 (ref 5–15)
BUN: 21 mg/dL — ABNORMAL HIGH (ref 6–20)
CO2: 28 mmol/L (ref 22–32)
Calcium: 9.4 mg/dL (ref 8.9–10.3)
Chloride: 102 mmol/L (ref 98–111)
Creatinine, Ser: 0.63 mg/dL (ref 0.44–1.00)
GFR, Estimated: >60 mL/min (ref >60)
Glucose, Bld: 256 mg/dL — ABNORMAL HIGH (ref 70–99)
Potassium: 4.1 mmol/L (ref 3.5–5.1)
Sodium: 138 mmol/L (ref 135–145)
Total Bilirubin: 0.5 mg/dL (ref 0.3–1.2)
Total Protein: 6.3 g/dL — ABNORMAL LOW (ref 6.5–8.1)

## 2020-06-02 LAB — FIBRIN DERIVATIVES D-DIMER (ARMC ONLY): Fibrin derivatives D-dimer (ARMC): 2284.17 ng/mL (FEU) — ABNORMAL HIGH (ref 0.00–499.00)

## 2020-06-02 LAB — GLUCOSE, CAPILLARY
Glucose-Capillary: 227 mg/dL — ABNORMAL HIGH (ref 70–99)
Glucose-Capillary: 388 mg/dL — ABNORMAL HIGH (ref 70–99)
Glucose-Capillary: 294 mg/dL — ABNORMAL HIGH (ref 70–99)
Glucose-Capillary: 260 mg/dL — ABNORMAL HIGH (ref 70–99)

## 2020-06-02 MED ORDER — INSULIN GLARGINE 100 UNIT/ML ~~LOC~~ SOLN
28.0000 [IU] | Freq: Two times a day (BID) | SUBCUTANEOUS | Status: DC
  Administered 2020-06-02 – 2020-06-03 (×2): 28 [IU] via SUBCUTANEOUS
  Filled 2020-06-02 (×3): qty 0.28

## 2020-06-02 MED ORDER — MAGNESIUM HYDROXIDE 400 MG/5ML PO SUSP
30.0000 mL | Freq: Once | ORAL | Status: DC
  Filled 2020-06-02: qty 30

## 2020-06-02 MED ORDER — FUROSEMIDE 10 MG/ML IJ SOLN
40.0000 mg | Freq: Once | INTRAMUSCULAR | Status: AC
  Administered 2020-06-02: 40 mg via INTRAVENOUS
  Filled 2020-06-02: qty 4

## 2020-06-02 NOTE — Progress Notes (Signed)
Inpatient Diabetes Program Recommendations  AACE/ADA: New Consensus Statement on Inpatient Glycemic Control   Target Ranges:  Prepandial:   less than 140 mg/dL      Peak postprandial:   less than 180 mg/dL (1-2 hours)      Critically ill patients:  140 - 180 mg/dL  Results for ARLYSS, WEATHERSBY (MRN [id number]) as of 06/02/2020 07:44  Ref. Range 06/02/2020 04:51  Glucose Latest Ref Range: 70 - 99 mg/dL 256 (H)   Results for KHAMRYN, CALDERONE (MRN [id number]) as of 06/02/2020 07:44  Ref. Range 06/01/2020 07:38 06/01/2020 12:29 06/01/2020 16:24 06/01/2020 20:13  Glucose-Capillary Latest Ref Range: 70 - 99 mg/dL 154 (H) 269 (H) 231 (H) 309 (H)   Review of Glycemic Control  Diabetes history: DM2 Outpatient Diabetes medications: Glipizide XL 5 mg QAM, Metformin 1000 mg BID Current orders for Inpatient glycemic control: Lantus 25 units BID, Novolog 30 units TID with meals, Novolog 0-20 units TID with meals, Novolog 0-5 units QHS; Solumedrol 60 mg Q12H  Inpatient Diabetes Program Recommendations:    Insulin: If steroids are continued as ordered, please consider increasing Lantus to 28 units BID and meal coverage to Novolog 24 units TID with meals.  Thanks, Barnie Alderman, RN, MSN, CDE Diabetes Coordinator Inpatient Diabetes Program [phone] (Team Pager from 8am to 5pm)

## 2020-06-02 NOTE — Progress Notes (Addendum)
PROGRESS NOTE    [name]   LYY:[id number]  DOB: 02-Aug-1961  PCP: Casilda Carls, MD    DOA: 05/25/2020 LOS: 8   Brief Narrative   Gabriella Robinson is a 59 y.o. female with medical history of multiple sclerosis on interferon-beta, hypertension, hyperlipidemia, type 2 diabetes mellitus, migraine headache, opioid dependence, fibromyalgia, neck pain, back pain, who presented to the ED on 05/25/20 with shortness breath, cough, fever and chills, nausea, vomiting, diarrhea x 5 days after close household contact with Covid-19.  Evaluation in the ED - positive Covid-19 PCR. Initial oxygen sat 84% on room air, improved to 90's on 4 L/min.  However, by morning of 10/21, patient requiring heated HFNC at 40 L/min.  Chest xray showed multifocal infiltrates.  Pt had tachycardia, tachypnea as well, meeting criteria for sepsis due to Covid-19 pneumonia.    Initially in HHS with blood glucose of 753.  Started on insulin infusion.  Transitioned to subcutaneous insulin the next morning.        Assessment & Plan   Acute Hypoxic Resp. Failure/Pneumonia due to COVID-19   Recent Labs  Lab 05/27/20 0622 05/27/20 0622 05/28/20 0517 05/28/20 0517 05/29/20 0646 05/30/20 0734 05/31/20 0654 06/01/20 0550 06/02/20 0451  FERRITIN 134  --  116  --  113 91  --   --   --   CRP 2.8*  --  4.8*  --  1.6* 0.9  --  0.6  --   ALT 20   < > 20   < > 19 26 32 33 31  PROCALCITON <0.10  --   --   --   --  <0.10 <0.10  --   --    < > = values in this interval not displayed.    D Dimer at [facility] ng/ml: [phone]   Objective findings: Oxygen requirements: Patient remains on high flow nasal cannula 40 L/min, 40% FiO2.  Saturating in the early 90s.    COVID 19 Therapeutics: Antibacterials: None.  Procalcitonin less than 0.1 Remdesivir: Course extended to 10 days.  Day 9. Steroids: Remains on Solu-Medrol 60 mg every 12 hours Diuretics: Furosemide given yesterday.  We  will repeat dose today. Inhaled Steroids: Dulera Baricitinib: Patient declined baricitinib PUD Prophylaxis: Pepcid DVT Prophylaxis: On therapeutic Lovenox  From a respiratory standpoint patient remains tenuous.  Still on high amounts of oxygen.  Remains on Remdesivir.  She is currently getting a 10-day course.  She declined baricitinib.  She remains on steroids.  We will give her additional dose of furosemide.  Continue with inhaled steroids.  Continue incentive spirometry, mobilization and prone positioning.  The treatment plan and use of medications and known side effects were discussed with patient/family. Some of the medications used are based on case reports/anecdotal data.  All other medications being used in the management of COVID-19 based on limited study data.  Complete risks and long-term side effects are unknown, however in the best clinical judgment they seem to be of some benefit.  Patient/family wanted to proceed with treatment options provided.  Acute pulmonary embolism CT angiogram did show acute pulmonary embolism without any evidence for right heart strain.  Hemodynamically stable.  Proceed with echocardiogram.  Continue with full dose Lovenox.  Lower extremity Dopplers negative for DVT.  Severe Sepsis due to Covid infection POA, criteria for sepsis including tachycardia, tachypnea, and known Covid-19 infection. Normal lactic acid.  Acute kidney injury reflects end organ dysfunction.  Patient treated in the ED with IV fluids  and started on above therapies for Covid-19 infection.  Sepsis physiology resolved.  Acute kidney injury Present at admission.  Likely due to hypovolemia along with the use of ARB and HCTZ.  Resolved with IV hydration.  Continue to hold nephrotoxic agents.  Renal function remained stable  Hyperosmolar hyperglycemic state (HHS) POA/diabetes mellitus type 2 in obese Presented with glucose 753. Due to infection and possibly noncompliance with medication during  illness.  Initially required insulin infusion.   She was subsequently transitioned to subcutaneous insulin.  HbA1c is 11.  Patient on Metformin and glipizide at home which is on hold.  Consider increasing dose of Lantus.    Hypokalemia Stable  History of multiple sclerosis Patient on beta interferon injections 3 times a week.  Currently on hold.  Also noted to be on baclofen and tizanidine.    Essential hypertension Losartan was resumed since kidney function had improved.  Currently on amlodipine as well.  On HCTZ at home which is currently on hold.  Blood pressure is reasonably well controlled.  Hyperlipidemia Continue Lipitor.  Insomnia Continue home trazodone  Obesity Estimated body mass index is 35.52 kg/m as calculated from the following:   Height as of this encounter: 5\' 8"  (1.727 m).   Weight as of this encounter: 106 kg.   DVT prophylaxis: Lovenox   Diet:  Diet Orders (From admission, onward)    Start     Ordered   06/01/20 1553  Diet heart healthy/carb modified Room service appropriate? Yes; Fluid consistency: Thin  Diet effective now       Question Answer Comment  Diet-HS Snack? Snack-yes   Room service appropriate? Yes   Fluid consistency: Thin      06/01/20 1552            Code Status: Full Code    Subjective 06/02/20    Patient denies any new complaints today.  Continues to have difficulty breathing with minimal exertion.  No chest pain.  Dry cough.   Disposition Plan & Communication   Status is: Inpatient  Remains inpatient appropriate because:Inpatient level of care appropriate due to severity of illness.  Patient has significant oxygen requirements and remains on IV therapies as above.   Dispo: The patient is from: Home              Anticipated d/c is to: SNF              Anticipated d/c date is: 3 days              Patient currently is not medically stable to d/c.   Family Communication:   On 10/27: Patient's daughter [name] was  updated. She and her sister were quite confrontational. They were demanding to know why the patient was requiring such high amounts of oxygen. I did my best explain the disease process to them. They then accused me of lying to them about patient being on Lovenox.  The differences between prophylactic and therapeutic doses was explained to them.   Will attempt to reach out to family again today.    Consults, Procedures, Significant Events   Consultants:   none  Procedures:   none  Antimicrobials:  Anti-infectives (From admission, onward)   Start     Dose/Rate Route Frequency Ordered Stop   05/30/20 1815  remdesivir 100 mg in sodium chloride 0.9 % 100 mL IVPB        100 mg 200 mL/hr over 30 Minutes Intravenous Daily 05/30/20 1716 06/04/20 0959  05/26/20 1000  remdesivir 100 mg in sodium chloride 0.9 % 100 mL IVPB       "Followed by" Linked Group Details   100 mg 200 mL/hr over 30 Minutes Intravenous Daily 05/25/20 1441 05/29/20 1020   05/25/20 1700  remdesivir 200 mg in sodium chloride 0.9% 250 mL IVPB       "Followed by" Linked Group Details   200 mg 580 mL/hr over 30 Minutes Intravenous Once 05/25/20 1441 05/25/20 1630         Objective   Vitals:   06/01/20 2016 06/02/20 0243 06/02/20 0506 06/02/20 0853  BP:   120/68 (!) 150/92  Pulse:   79 91  Resp:    18  Temp:   98.2 F (36.8 C) 98.4 F (36.9 C)  TempSrc:   Oral Oral  SpO2: 99% 99% 100% 91%  Weight:      Height:        Intake/Output Summary (Last 24 hours) at 06/02/2020 1024 Last data filed at 06/02/2020 0114 Gross per 24 hour  Intake 200 ml  Output 1700 ml  Net -1500 ml   Filed Weights   05/30/20 0542 05/31/20 0517 06/01/20 0433  Weight: 106.3 kg 106.2 kg 106 kg    Physical Exam:  General appearance: Awake alert.  In no distress Resp: Mildly tachypneic at rest.  No use of accessory muscles.  Coarse breath sounds with crackles at the bases.  No wheezing or rhonchi. Cardio: S1-S2 is normal  regular.  No S3-S4.  No rubs murmurs or bruit GI: Abdomen is soft.  Nontender nondistended.  Bowel sounds are present normal.  No masses organomegaly Extremities: No edema.  Full range of motion of lower extremities. Neurologic: Alert and oriented x3.  No focal neurological deficits.      Labs   Data Reviewed: I have personally reviewed following labs and imaging studies  CBC: Recent Labs  Lab 05/29/20 0646 05/30/20 0734 05/31/20 0654 06/01/20 0550 06/02/20 0451  WBC 9.7 10.7* 9.5 12.0* 12.4*  NEUTROABS 7.3 8.6* 7.5 8.3* 8.3*  HGB 13.5 12.1 11.8* 11.3* 11.7*  HCT 41.5 36.8 37.0 35.2* 36.5  MCV 86.1 86.0 88.3 88.4 88.8  PLT 553* 583* 562* 574* 751*   Basic Metabolic Panel: Recent Labs  Lab 05/27/20 0622 05/27/20 0622 05/28/20 0517 05/28/20 0517 05/29/20 0646 05/30/20 0734 05/31/20 0654 06/01/20 0550 06/02/20 0451  NA 140   < > 138   < > 139 136 133* 138 138  K 3.1*   < > 3.8   < > 3.6 3.6 3.8 3.7 4.1  CL 107   < > 106   < > 107 104 100 105 102  CO2 23   < > 25   < > 22 24 28 24 28   GLUCOSE 188*   < > 321*   < > 145* 207* 230* 171* 256*  BUN 23*   < > 21*   < > 21* 24* 23* 20 21*  CREATININE 0.74   < > 0.63   < > 0.75 0.65 0.64 0.58 0.63  CALCIUM 9.1   < > 9.4   < > 9.5 9.1 8.9 8.9 9.4  MG 1.9  --  2.2  --  2.2 2.4  --   --   --    < > = values in this interval not displayed.   GFR: Estimated Creatinine Clearance: 97.7 mL/min (by C-G formula based on SCr of 0.63 mg/dL). Liver Function Tests: Recent Labs  Lab 05/29/20  7341 05/30/20 0734 05/31/20 0654 06/01/20 0550 06/02/20 0451  AST 20 24 25 26 18   ALT 19 26 32 33 31  ALKPHOS 72 69 69 62 70  BILITOT 0.6 0.7 0.5 0.6 0.5  PROT 7.3 6.7 6.5 6.1* 6.3*  ALBUMIN 3.0* 2.7* 2.8* 2.7* 2.8*   CBG: Recent Labs  Lab 06/01/20 0738 06/01/20 1229 06/01/20 1624 06/01/20 2013 06/02/20 0839  GLUCAP 154* 269* 231* 309* 227*   Sepsis Labs: Recent Labs  Lab 05/27/20 0622 05/30/20 0734 05/31/20 0654    PROCALCITON <0.10 <0.10 <0.10    Recent Results (from the past 240 hour(s))  Respiratory Panel by RT PCR (Flu A&B, Covid) - Nasopharyngeal Swab     Status: Abnormal   Collection Time: 05/25/20  1:43 PM   Specimen: Nasopharyngeal Swab  Result Value Ref Range Status   SARS Coronavirus 2 by RT PCR POSITIVE (A) NEGATIVE Final    Comment: RESULT CALLED TO, READ BACK BY AND VERIFIED WITH: RENO, REED AT 1459 ON 05/25/20 BY SS (NOTE) SARS-CoV-2 target nucleic acids are DETECTED.  SARS-CoV-2 RNA is generally detectable in upper respiratory specimens  during the acute phase of infection. Positive results are indicative of the presence of the identified virus, but do not rule out bacterial infection or co-infection with other pathogens not detected by the test. Clinical correlation with patient history and other diagnostic information is necessary to determine patient infection status. The expected result is Negative.  Fact Sheet for Patients:  PinkCheek.be  Fact Sheet for Healthcare Providers: GravelBags.it  This test is not yet approved or cleared by the [place] FDA and  has been authorized for detection and/or diagnosis of SARS-CoV-2 by FDA under an Emergency Use Authorization (EUA).  This EUA will remain in effect (meaning this test can b e used) for the duration of  the COVID-19 declaration under Section 564(b)(1) of the Act, 21 U.S.C. section 360bbb-3(b)(1), unless the authorization is terminated or revoked sooner.      Influenza A by PCR NEGATIVE NEGATIVE Final   Influenza B by PCR NEGATIVE NEGATIVE Final    Comment: (NOTE) The Xpert Xpress SARS-CoV-2/FLU/RSV assay is intended as an aid in  the diagnosis of influenza from Nasopharyngeal swab specimens and  should not be used as a sole basis for treatment. Nasal washings and  aspirates are unacceptable for Xpert Xpress SARS-CoV-2/FLU/RSV  testing.  Fact Sheet  for Patients: PinkCheek.be  Fact Sheet for Healthcare Providers: GravelBags.it  This test is not yet approved or cleared by the [place] FDA and  has been authorized for detection and/or diagnosis of SARS-CoV-2 by  FDA under an Emergency Use Authorization (EUA). This EUA will remain  in effect (meaning this test can be used) for the duration of the  Covid-19 declaration under Section 564(b)(1) of the Act, 21  U.S.C. section 360bbb-3(b)(1), unless the authorization is  terminated or revoked. Performed at [facility], [address] [place], [place] [address]   Culture, blood (Routine X 2) w Reflex to ID Panel     Status: None   Collection Time: 05/26/20 12:43 AM   Specimen: BLOOD  Result Value Ref Range Status   Specimen Description BLOOD BLOOD LEFT ARM  Final   Special Requests   Final    BOTTLES DRAWN AEROBIC AND ANAEROBIC Blood Culture results may not be optimal due to an excessive volume of blood received in culture bottles   Culture   Final    NO GROWTH 5 DAYS Performed at  [facility], [address] [place], [place] [address]    Report Status 05/31/2020 FINAL  Final  Culture, blood (Routine X 2) w Reflex to ID Panel     Status: None   Collection Time: 05/26/20 12:43 AM   Specimen: BLOOD  Result Value Ref Range Status   Specimen Description BLOOD BLOOD RIGHT ARM  Final   Special Requests   Final    BOTTLES DRAWN AEROBIC AND ANAEROBIC Blood Culture results may not be optimal due to an excessive volume of blood received in culture bottles   Culture   Final    NO GROWTH 5 DAYS Performed at [facility], [address], [place], [place] [address]    Report Status 05/31/2020 FINAL  Final      Imaging Studies   CT ANGIO CHEST PE W OR WO CONTRAST  Result Date: 06/01/2020 CLINICAL DATA:  COVID-19 positive.  Shortness of breath. EXAM: CT ANGIOGRAPHY CHEST WITH CONTRAST TECHNIQUE:  Multidetector CT imaging of the chest was performed using the standard protocol during bolus administration of intravenous contrast. Multiplanar CT image reconstructions and MIPs were obtained to evaluate the vascular anatomy. CONTRAST:  160mL OMNIPAQUE IOHEXOL 350 MG/ML SOLN COMPARISON:  January 09, 2017 FINDINGS: Cardiovascular: Contrast injection is sufficient to demonstrate satisfactory opacification of the pulmonary arteries to the segmental level. Findings are suspicious for subsegmental pulmonary emboli involving the left lower lobe (axial series 6, image 155). There are subsegmental pulmonary emboli involving the right upper lobe (axial series 6, image 76). There is no CT evidence for heart strain. The size of the main pulmonary artery is enlarged, measuring 3.5 cm. Cardiomegaly with coronary artery calcification. There are atherosclerotic changes of the thoracic aorta without evidence for an aneurysm or dissection. Mediastinum/Nodes: -- No mediastinal lymphadenopathy. -- No hilar lymphadenopathy. -- No axillary lymphadenopathy. -- No supraclavicular lymphadenopathy. -- Normal thyroid gland where visualized. -  Unremarkable esophagus. Lungs/Pleura: Diffuse bilateral ground-glass airspace opacities are noted. There is atelectasis at the lung bases. There is no pneumothorax. There is no large pleural effusion. Upper Abdomen: Contrast bolus timing is not optimized for evaluation of the abdominal organs. The visualized portions of the organs of the upper abdomen are normal. Musculoskeletal: No chest wall abnormality. No bony spinal canal stenosis. Review of the MIP images confirms the above findings. IMPRESSION: 1. There are subsegmental pulmonary emboli involving the right upper lobe and left lower lobe. No CT evidence for heart strain. 2. Diffuse bilateral ground-glass airspace opacities consistent with the patient's history of viral pneumonia. 3. Cardiomegaly with coronary artery disease. 4. Enlarged main  pulmonary artery, which can be seen in patients with elevated pulmonary artery pressures. Aortic Atherosclerosis (ICD10-I70.0). These results were called by telephone at the time of interpretation on 06/01/2020 at 4:03 pm to provider [facility] , who verbally acknowledged these results. Electronically Signed   By: Constance Holster M.D.   On: 06/01/2020 16:01   US Venous Img Lower Bilateral (DVT)  Result Date: 06/01/2020 CLINICAL DATA:  PE EXAM: Bilateral LOWER EXTREMITY VENOUS DOPPLER ULTRASOUND TECHNIQUE: Gray-scale sonography with compression, as well as color and duplex ultrasound, were performed to evaluate the deep venous system(s) from the level of the common femoral vein through the popliteal and proximal calf veins. COMPARISON:  None. FINDINGS: VENOUS Normal compressibility of the common femoral, superficial femoral, and popliteal veins, as well as the visualized calf veins. Visualized portions of profunda femoral vein and great saphenous vein unremarkable. No filling defects to suggest DVT on  grayscale or color Doppler imaging. Doppler waveforms show normal direction of venous flow, normal respiratory plasticity and response to augmentation. OTHER None. Limitations: none IMPRESSION: Negative. Electronically Signed   By: Constance Holster M.D.   On: 06/01/2020 17:35     Medications   Scheduled Meds:  amLODipine  5 mg Oral Daily   vitamin C  500 mg Oral Daily   aspirin EC  81 mg Oral Daily   atorvastatin  10 mg Oral Daily   baclofen  5 mg Oral QHS   dextromethorphan-guaiFENesin  1 tablet Oral BID   enoxaparin (LOVENOX) injection  1 mg/kg Subcutaneous Q12H   famotidine  20 mg Oral Daily   feeding supplement (GLUCERNA SHAKE)  237 mL Oral q1600   gabapentin  800 mg Oral BID   influenza vac split quadrivalent PF  0.5 mL Intramuscular Tomorrow-1000   insulin aspart  0-20 Units Subcutaneous TID WC   insulin aspart  0-5 Units Subcutaneous QHS   insulin aspart  20 Units  Subcutaneous TID WC   insulin glargine  25 Units Subcutaneous BID   ipratropium  2 puff Inhalation Q4H   losartan  50 mg Oral Daily   methylPREDNISolone (SOLU-MEDROL) injection  60 mg Intravenous Q12H   mometasone-formoterol  2 puff Inhalation BID   polyethylene glycol  17 g Oral Daily   senna  2 tablet Oral Daily   tiZANidine  4 mg Oral TID   topiramate  100 mg Oral BID   traZODone  100 mg Oral QHS   zinc sulfate  220 mg Oral Daily   Continuous Infusions:  remdesivir 100 mg in NS 100 mL 100 mg (06/02/20 0858)       LOS: 8 days   Bonnielee Haff,  [facility]  06/02/2020, 10:24 AM    If 7PM-7AM, please contact night-coverage. How to contact the [facility] Attending or Consulting provider [place] or covering provider during after hours [place], for this patient?    1. Check the care team in [facility] and look for a) attending/consulting TR[facility] provider listed and b) the [facility] team listed 2. Log into [email or web address] and use [place]'s universal password to access. If you do not have the password, please contact the hospital operator. 3. Locate the [facility] provider you are looking for under [facility] and page to a number that you can be directly reached. 4. If you still have difficulty reaching the provider, please page the [facility] (Director on Call) for the Hospitalists listed on amion for assistance.

## 2020-06-02 NOTE — Evaluation (Signed)
Occupational Therapy Evaluation Patient Details Name: Gabriella Robinson MRN: [id number] DOB: [birth year] Today's Date: 06/02/2020    History of Present Illness 59 y/o female here with COVID.  History includes MS, HTN, HLD, DM, Hypersosmolar hyperglycemic state, hypokalemia, AKI, sepsis, fibromyalgia, opiod dependence. At baseline is ind with most ADLs. Has required high flow nasal cannula t/o hospitalization. Patient was independent prior to admission living with family.   Clinical Impression   Patient presenting with decreased I in self care, balance, functional mobility/transfers, endurance,and safety awareness. Pt also very anxious with functional mobility tasks and fearful of falling.  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 Pt reports she is on disability for MS but does not use any AD for occupational performance or mobility. Pt currently on 40L/min and 40% FiO2 with O2 remaining above 90% during this session. Pt also very worried about urinary incontinence with session and needed reassurance that therapist would assist her onto [facility] and reapply purewick at end of session. Patient currently functioning at min - mod A overall with self care tasks and functional transfers. Pt does not have 24/7 assist at home and is very deconditioned. Patient will benefit from acute OT to increase overall independence in the areas of ADLs, functional mobility, and safety awareness in order to safely discharge to next venue of care.    Follow Up Recommendations  Supervision/Assistance - 24 hour;SNF    Equipment Recommendations  Other (comment) (defer to next venue of care)       Precautions / Restrictions Precautions Precautions: Fall      Mobility Bed Mobility Overal bed mobility: Modified Independent;Needs Assistance Bed Mobility: Supine to Sit;Sit to Supine Rolling: Min assist   Supine to sit: Min assist Sit to supine:  Min assist   General bed mobility comments: min cuing for technique and hand placement.    Transfers Overall transfer level: Needs assistance Equipment used: Rolling walker (2 wheeled) Transfers: Sit to/from Stand Sit to Stand: Min assist Stand pivot transfers: Min assist       General transfer comment: heavy reliance on UE support on RW    Balance Overall balance assessment: Needs assistance Sitting-balance support: Bilateral upper extremity supported Sitting balance-Leahy Scale: Fair Sitting balance - Comments: min A for seated balance while donning socks   Standing balance support: Bilateral upper extremity supported;No upper extremity supported Standing balance-Leahy Scale: Fair          ADL either performed or assessed with clinical judgement   ADL Overall ADL's : Needs assistance/impaired    General ADL Comments: Pt needing min A for sitting balance while donning B socks. Pt able to get socks started but needing increased time to pull all the way up. All aspects of self care appear very fatiguing to pt.     Vision Baseline Vision/History: Wears glasses Wears Glasses: Reading only Patient Visual Report: No change from baseline              Pertinent Vitals/Pain Pain Assessment: No/denies pain     Hand Dominance Right   Extremity/Trunk Assessment Upper Extremity Assessment Upper Extremity Assessment: Generalized weakness   Lower Extremity Assessment Lower Extremity Assessment: Generalized weakness   Cervical / Trunk Assessment Cervical / Trunk Assessment: Normal   Communication Communication Communication: No difficulties   Cognition Arousal/Alertness: Awake/alert Behavior During Therapy: WFL for tasks assessed/performed;Anxious Overall Cognitive Status: Within Functional Limits for tasks assessed  General Comments: A&Ox4, seemed to have slow response to most questions. She is anxious about mobility and fearful of falling.               Home Living Family/patient expects to be discharged to:: Private residence Living Arrangements: Children Available Help at Discharge: Family;Available PRN/intermittently Type of Home: Apartment Home Access: Stairs to enter Entrance Stairs-Number of Steps: 30 Entrance Stairs-Rails: Right Home Layout: One level     Bathroom Shower/Tub: Teacher, early years/pre: Standard     Home Equipment: None   Additional Comments: Patient lives at home with her two daugthers and grandson. No equipment at home per patient report.      Prior Functioning/Environment Level of Independence: Independent        Comments: Patient reports she is ind at baseline. Has MS and is on disability but does require any AD's or assistance with ADLs. She reports she watches her grandson while her daughter is working.        OT Problem List: Decreased strength;Decreased safety awareness;Decreased activity tolerance;Decreased knowledge of use of DME or AE;Impaired balance (sitting and/or standing);Decreased knowledge of precautions;Decreased coordination      OT Treatment/Interventions: Self-care/ADL training;Therapeutic exercise;Therapeutic activities;Energy conservation;Patient/family education;DME and/or AE instruction;Balance training    OT Goals(Current goals can be found in the care plan section) Acute Rehab OT Goals Patient Stated Goal: to get stronger OT Goal Formulation: With patient Time For Goal Achievement: 06/16/20 Potential to Achieve Goals: Good ADL Goals Pt Will Perform Grooming: with supervision;standing Pt Will Transfer to Toilet: with supervision;ambulating Pt Will Perform Toileting - Clothing Manipulation and hygiene: with supervision;sit to/from stand  OT Frequency: Min 1X/week    AM-PAC OT "6 Clicks" Daily Activity     Outcome Measure Help from another person eating meals?: None Help from another person taking care of personal grooming?: A Little Help from another  person toileting, which includes using toliet, bedpan, or urinal?: A Lot Help from another person bathing (including washing, rinsing, drying)?: A Lot Help from another person to put on and taking off regular upper body clothing?: A Little Help from another person to put on and taking off regular lower body clothing?: A Lot 6 Click Score: 16   End of Session Equipment Utilized During Treatment: Rolling walker Nurse Communication: Mobility status  Activity Tolerance: Patient tolerated treatment well Patient left: with family/visitor present  OT Visit Diagnosis: Unsteadiness on feet (R26.81);Muscle weakness (generalized) (M62.81)                Time: 1427-1510 OT Time Calculation (min): 43 min Charges:  OT General Charges $OT Visit: 1 Visit OT Evaluation $OT Eval Low Complexity: 1 Low OT Treatments $Self Care/Home Management : 23-37 mins  Darleen Crocker, MS, OTR/L , CBIS ascom [phone]  06/02/20, 4:39 PM

## 2020-06-03 ENCOUNTER — Inpatient Hospital Stay
Admit: 2020-06-03 | Discharge: 2020-06-03 | Disposition: A | Discharge status: Home / Self Care | Payer: Medicare Other | Attending: Internal Medicine | Admitting: Internal Medicine

## 2020-06-03 DIAGNOSIS — R739 Hyperglycemia, unspecified: Secondary | ICD-10-CM | POA: Diagnosis not present

## 2020-06-03 DIAGNOSIS — J9601 Acute respiratory failure with hypoxia: Secondary | ICD-10-CM | POA: Diagnosis not present

## 2020-06-03 DIAGNOSIS — U071 COVID-19: Secondary | ICD-10-CM | POA: Diagnosis not present

## 2020-06-03 DIAGNOSIS — I10 Essential (primary) hypertension: Secondary | ICD-10-CM | POA: Diagnosis not present

## 2020-06-03 LAB — BASIC METABOLIC PANEL
Anion gap: 8 (ref 5–15)
BUN: 22 mg/dL — ABNORMAL HIGH (ref 6–20)
CO2: 28 mmol/L (ref 22–32)
Calcium: 9.4 mg/dL (ref 8.9–10.3)
Chloride: 101 mmol/L (ref 98–111)
Creatinine, Ser: 0.57 mg/dL (ref 0.44–1.00)
GFR, Estimated: >60 mL/min (ref >60)
Glucose, Bld: 228 mg/dL — ABNORMAL HIGH (ref 70–99)
Potassium: 4.4 mmol/L (ref 3.5–5.1)
Sodium: 137 mmol/L (ref 135–145)

## 2020-06-03 LAB — CBC
HCT: 37.2 % (ref 36.0–46.0)
Hemoglobin: 11.9 g/dL — ABNORMAL LOW (ref 12.0–15.0)
MCH: 28.5 pg (ref 26.0–34.0)
MCHC: 32 g/dL (ref 30.0–36.0)
MCV: 89.2 fL (ref 80.0–100.0)
Platelets: 592 10*3/uL — ABNORMAL HIGH (ref 150–400)
RBC: 4.17 MIL/uL (ref 3.87–5.11)
RDW: 14.1 % (ref 11.5–15.5)
WBC: 9.4 10*3/uL (ref 4.0–10.5)
nRBC: 0 % (ref 0.0–0.2)

## 2020-06-03 LAB — GLUCOSE, CAPILLARY
Glucose-Capillary: 253 mg/dL — ABNORMAL HIGH (ref 70–99)
Glucose-Capillary: 282 mg/dL — ABNORMAL HIGH (ref 70–99)
Glucose-Capillary: 262 mg/dL — ABNORMAL HIGH (ref 70–99)
Glucose-Capillary: 242 mg/dL — ABNORMAL HIGH (ref 70–99)

## 2020-06-03 LAB — ECHOCARDIOGRAM COMPLETE
AR max vel: 2.35 cm2
AV Area VTI: 3.02 cm2
AV Area mean vel: 2.54 cm2
AV Mean grad: 2 mmHg
AV Peak grad: 3.8 mmHg
Ao pk vel: 0.97 m/s
Area-P 1/2: 2.83 cm2
Height: 68 in
S' Lateral: 2.38 cm
Weight: 3718.4 oz

## 2020-06-03 MED ORDER — FUROSEMIDE 10 MG/ML IJ SOLN
60.0000 mg | Freq: Once | INTRAMUSCULAR | Status: AC
  Administered 2020-06-03: 60 mg via INTRAVENOUS
  Filled 2020-06-03: qty 6

## 2020-06-03 MED ORDER — INSULIN GLARGINE 100 UNIT/ML ~~LOC~~ SOLN
32.0000 [IU] | Freq: Two times a day (BID) | SUBCUTANEOUS | Status: DC
  Administered 2020-06-03 – 2020-06-06 (×6): 32 [IU] via SUBCUTANEOUS
  Filled 2020-06-03 (×8): qty 0.32

## 2020-06-03 NOTE — Progress Notes (Signed)
[place] for Lovenox Indication: pulmonary embolus  Patient Measurements: Height: 5\' 8"  (172.7 cm) Weight: 105.4 kg (232 lb 6.4 oz) IBW/kg (Calculated) : 63.9  Labs: Recent Labs    06/01/20 0550 06/01/20 0550 06/02/20 0451 06/03/20 0615  HGB 11.3*   < > 11.7* 11.9*  HCT 35.2*  --  36.5 37.2  PLT 574*  --  575* 592*  CREATININE 0.58  --  0.63 0.57   < > = values in this interval not displayed.    Estimated Creatinine Clearance: 97.4 mL/min (by C-G formula based on SCr of 0.57 mg/dL).   Medical History: Past Medical History:  Diagnosis Date  . Chronic back pain   . Diabetes ([place])   . HBP (high blood pressure)   . History of blood clots   . Lumbar canal stenosis 11/12/2013  . Migraines   . MS (multiple sclerosis) ([place])   . Sinus complaint   . Sleep apnea     Assessment: 59yo female with medical history ofmultiple sclerosis on interferon-beta, HTN, HLD, T2DM, migraine headache, opioid dependence, fibromyalgia, neck pain, back pain, who presented to the ED on 05/25/20 with shortness breath - patient is COVID-19 positive. 10/27 CT chest revealed subsegmental PE involving R and L lower lobe. H/H slightly low, but stable, Plt 574, D-dimer trending down. Patient was previously on prophylactic dosing of lovenox (0.5mg /kg given BMI >30). Pharmacy has been consulted for lovenox dosing for PE.     Plan:  Continue lovenox 105mg  SQ q12h. Transition to PO once pt is more stable.  Monitor CBC per protocol   Oswald Hillock, PharmD, BCPS 06/03/2020 1:11 PM

## 2020-06-03 NOTE — Progress Notes (Addendum)
Inpatient Diabetes Program Recommendations  AACE/ADA: New Consensus Statement on Inpatient Glycemic Control  Target Ranges:  Prepandial:   less than 140 mg/dL      Peak postprandial:   less than 180 mg/dL (1-2 hours)      Critically ill patients:  140 - 180 mg/dL   Results for Gabriella Robinson, Gabriella Robinson (MRN [id number]) as of 06/03/2020 09:36  Ref. Range 06/02/2020 08:39 06/02/2020 11:39 06/02/2020 17:05 06/02/2020 20:33 06/03/2020 08:35  Glucose-Capillary Latest Ref Range: 70 - 99 mg/dL 227 (H)  Novolog 27 units  Lantus 25 units 388 (H)  Novolog 40 units 294 (H)  Novolog 31 units 260 (H)  Novolog 3 units  Lantus 28 units  253 (H)   Results for Gabriella Robinson, Gabriella Robinson (MRN [id number]) as of 06/03/2020 13:43  Ref. Range 05/25/2020 18:07  Hemoglobin A1C Latest Ref Range: 4.8 - 5.6 % 11.0 (H)   Review of Glycemic Control  Outpatient DM medications: Glipizide XL 5 mg QAM, Metformin 1000 mg BID Current orders for Inpatient glycemic control: Lantus 28 units BID, Novolog 20 units TID with meals, Novolog 0-20 units TID with meals, Novolog 0-5 units QHS; Solumedrol 60 mg Q12H  Inpatient Diabetes Program Recommendations:    Insulin: If steroids are continued as ordered, please consider increasing Lantus to 33 units BID and meal coverage to Novolog 25 units TID with meals.  Addendum 06/03/20@14 :00-Tried to call patient's room phone several times today but no answer. Called cell phone number in chart but went straight to voicemail.   Thanks, Barnie Alderman, RN, MSN, CDE Diabetes Coordinator Inpatient Diabetes Program [phone] (Team Pager from 8am to 5pm)

## 2020-06-03 NOTE — Progress Notes (Signed)
PROGRESS NOTE    Gabriella Robinson   VZC:[id number]  DOB: [birth year]  PCP: Casilda Carls, MD    DOA: 05/25/2020 LOS: 9   Brief Narrative   Gabriella Robinson is a 59 y.o. female with medical history of multiple sclerosis on interferon-beta, hypertension, hyperlipidemia, type 2 diabetes mellitus, migraine headache, opioid dependence, fibromyalgia, neck pain, back pain, who presented to the ED on 05/25/20 with shortness breath, cough, fever and chills, nausea, vomiting, diarrhea x 5 days after close household contact with Covid-19.  Evaluation in the ED - positive Covid-19 PCR. Initial oxygen sat 84% on room air, improved to 90's on 4 L/min.  However, by morning of 10/21, patient requiring heated HFNC at 40 L/min.  Chest xray showed multifocal infiltrates.  Pt had tachycardia, tachypnea as well, meeting criteria for sepsis due to Covid-19 pneumonia.    Initially in HHS with blood glucose of 753.  Started on insulin infusion.  Transitioned to subcutaneous insulin the next morning.      Assessment & Plan   Acute Hypoxic Resp. Failure/Pneumonia due to COVID-19   Recent Labs  Lab 05/28/20 0517 05/28/20 0517 05/29/20 0646 05/30/20 0734 05/31/20 0654 06/01/20 0550 06/02/20 0451  FERRITIN 116  --  113 91  --   --   --   CRP 4.8*  --  1.6* 0.9  --  0.6  --   ALT 20   < > 19 26 32 33 31  PROCALCITON  --   --   --  <0.10 <0.10  --   --    < > = values in this interval not displayed.    D Dimer at [facility] ng/ml: [phone]--[phone]--[phone]   Objective findings: Oxygen requirements: Patient remains on high flow nasal cannula 40 L/min.  Saturations noted to be in the mid to late 90s.  Decreased nasal cannula oxygen flow to 38 L.   Nursing staff was notified.  COVID 19 Therapeutics: Antibacterials: None.  Procalcitonin less than 0.1 Remdesivir: Course extended to 10 days.  Day 10. Steroids: Remains on Solu-Medrol 60 mg every 12 hours Diuretics: Furosemide was given  on 10/27 and 10/28.  We will repeat dose today.   Inhaled Steroids: Dulera Baricitinib: Patient declined baricitinib PUD Prophylaxis: Pepcid DVT Prophylaxis: On therapeutic Lovenox  From a respiratory standpoint patient remains tenuous though as she seems to be improving.  Remains on heated high flow.  Should be able to wean her down today.  She will complete a 10-day course of Remdesivir today.  Remains on steroids.  Patient declined baricitinib.  Give additional dose of furosemide today.  Continue with inhaled steroids.  Continue incentive spirometry mobilization prone positioning as much as possible.  The treatment plan and use of medications and known side effects were discussed with patient/family. Some of the medications used are based on case reports/anecdotal data.  All other medications being used in the management of COVID-19 based on limited study data.  Complete risks and long-term side effects are unknown, however in the best clinical judgment they seem to be of some benefit.  Patient/family wanted to proceed with treatment options provided.  Acute pulmonary embolism CT angiogram did show acute pulmonary embolism without any evidence for right heart strain.  Hemodynamically stable.  Echocardiogram is pending.  Continue full dose Lovenox.  Lower extremity Doppler studies were negative for DVT.  Will change to oral anticoagulation when her respiratory status is more stable.    Severe Sepsis due to Covid infection POA, criteria for  sepsis including tachycardia, tachypnea, and known Covid-19 infection. Normal lactic acid.  Acute kidney injury reflects end organ dysfunction.  Patient treated in the ED with IV fluids and started on above therapies for Covid-19 infection.  Sepsis physiology resolved.  Acute kidney injury Present at admission.  Likely due to hypovolemia along with the use of ARB and HCTZ.  Resolved with IV hydration.  Continue to hold nephrotoxic agents.  Renal function  remained stable while she is getting Lasix daily.  Hyperosmolar hyperglycemic state (HHS) POA/diabetes mellitus type 2 in obese Presented with glucose 753. Due to infection and possibly noncompliance with medication during illness.  Initially required insulin infusion.   She was subsequently transitioned to subcutaneous insulin.  HbA1c is 11.  Patient on Metformin and glipizide at home which is on hold.   Poorly controlled CBGs due to steroids.  Dose of Lantus was increased yesterday.  May need to go up again today.    Hypokalemia Repleted  History of multiple sclerosis Patient on beta interferon injections 3 times a week.  Currently on hold.  Also noted to be on baclofen and tizanidine.    Essential hypertension Losartan was resumed since kidney function had improved.  Currently on amlodipine as well.  On HCTZ at home which is currently on hold.  Blood pressure is reasonably well controlled.  Hyperlipidemia Continue Lipitor.  Insomnia Continue home trazodone  Obesity Estimated body mass index is 35.34 kg/m as calculated from the following:   Height as of this encounter: 5\' 8"  (1.727 m).   Weight as of this encounter: 105.4 kg.   DVT prophylaxis: Lovenox   Diet:  Diet Orders (From admission, onward)    Start     Ordered   06/01/20 1553  Diet heart healthy/carb modified Room service appropriate? Yes; Fluid consistency: Thin  Diet effective now       Question Answer Comment  Diet-HS Snack? Snack-yes   Room service appropriate? Yes   Fluid consistency: Thin      06/01/20 1552            Code Status: Full Code    Subjective 06/03/20    Patient denies any new complaints today.  Overall she states that she is feeling better.  Less short of breath.  No chest pain.     Disposition Plan & Communication   Status is: Inpatient  Remains inpatient appropriate because:Inpatient level of care appropriate due to severity of illness.  Patient has significant oxygen  requirements and remains on IV therapies as above.   Dispo: The patient is from: Home              Anticipated d/c is to: SNF              Anticipated d/c date is: 3 days              Patient currently is not medically stable to d/c.   Family Communication:   On 10/27: Patient's daughter [name] and Ramiro Harvest were updated. She and her sister were quite confrontational. They were demanding to know why the patient was requiring such high amounts of oxygen. I did my best explain the disease process to them. They then accused me of lying to them about patient being on Lovenox.  The differences between prophylactic and therapeutic doses was explained to them.   On 10/28: Status update was provided to hall even a patient's daughter.  She was apologetic for the conversation that occurred on 10/27.  She mentioned  that she will talk to her sister about this.    Consults, Procedures, Significant Events   Consultants:   none  Procedures:   none  Antimicrobials:  Anti-infectives (From admission, onward)   Start     Dose/Rate Route Frequency Ordered Stop   05/30/20 1815  remdesivir 100 mg in sodium chloride 0.9 % 100 mL IVPB        100 mg 200 mL/hr over 30 Minutes Intravenous Daily 05/30/20 1716 06/04/20 0959   05/26/20 1000  remdesivir 100 mg in sodium chloride 0.9 % 100 mL IVPB       "Followed by" Linked Group Details   100 mg 200 mL/hr over 30 Minutes Intravenous Daily 05/25/20 1441 05/29/20 1020   05/25/20 1700  remdesivir 200 mg in sodium chloride 0.9% 250 mL IVPB       "Followed by" Linked Group Details   200 mg 580 mL/hr over 30 Minutes Intravenous Once 05/25/20 1441 05/25/20 1630         Objective   Vitals:   06/02/20 2032 06/03/20 0422 06/03/20 0617 06/03/20 0843  BP: 106/61 109/64 113/63 (!) 141/81  Pulse: 86 75 79 84  Resp:  18  18  Temp: 99 F (37.2 C) 98.2 F (36.8 C) 99 F (37.2 C) 98.8 F (37.1 C)  TempSrc: Oral Oral Oral Oral  SpO2: 100% 94% 98% 100%    Weight:   105.4 kg   Height:        Intake/Output Summary (Last 24 hours) at 06/03/2020 1012 Last data filed at 06/03/2020 0844 Gross per 24 hour  Intake --  Output 4650 ml  Net -4650 ml   Filed Weights   05/31/20 0517 06/01/20 0433 06/03/20 0617  Weight: 106.2 kg 106 kg 105.4 kg    Physical Exam:  General appearance: Awake alert.  In no distress Resp: Improved effort.  Crackles bilateral bases.  No wheezing or rhonchi.   Cardio: S1-S2 is normal regular.  No S3-S4.  No rubs murmurs or bruit GI: Abdomen is soft.  Nontender nondistended.  Bowel sounds are present normal.  No masses organomegaly Extremities: No edema.  Moving all extremities Neurologic:   No focal neurological deficits.       Labs   Data Reviewed: I have personally reviewed following labs and imaging studies  CBC: Recent Labs  Lab 05/29/20 0646 05/29/20 0646 05/30/20 0734 05/31/20 0654 06/01/20 0550 06/02/20 0451 06/03/20 0615  WBC 9.7   < > 10.7* 9.5 12.0* 12.4* 9.4  NEUTROABS 7.3  --  8.6* 7.5 8.3* 8.3*  --   HGB 13.5   < > 12.1 11.8* 11.3* 11.7* 11.9*  HCT 41.5   < > 36.8 37.0 35.2* 36.5 37.2  MCV 86.1   < > 86.0 88.3 88.4 88.8 89.2  PLT 553*   < > 583* 562* 574* 575* 592*   < > = values in this interval not displayed.   Basic Metabolic Panel: Recent Labs  Lab 05/28/20 0517 05/28/20 0517 05/29/20 0646 05/29/20 0646 05/30/20 0734 05/31/20 0654 06/01/20 0550 06/02/20 0451 06/03/20 0615  NA 138   < > 139   < > 136 133* 138 138 137  K 3.8   < > 3.6   < > 3.6 3.8 3.7 4.1 4.4  CL 106   < > 107   < > 104 100 105 102 101  CO2 25   < > 22   < > 24 28 24 28 28   GLUCOSE 321*   < >  145*   < > 207* 230* 171* 256* 228*  BUN 21*   < > 21*   < > 24* 23* 20 21* 22*  CREATININE 0.63   < > 0.75   < > 0.65 0.64 0.58 0.63 0.57  CALCIUM 9.4   < > 9.5   < > 9.1 8.9 8.9 9.4 9.4  MG 2.2  --  2.2  --  2.4  --   --   --   --    < > = values in this interval not displayed.   GFR: Estimated Creatinine  Clearance: 97.4 mL/min (by C-G formula based on SCr of 0.57 mg/dL). Liver Function Tests: Recent Labs  Lab 05/29/20 0646 05/30/20 0734 05/31/20 0654 06/01/20 0550 06/02/20 0451  AST 20 24 25 26 18   ALT 19 26 32 33 31  ALKPHOS 72 69 69 62 70  BILITOT 0.6 0.7 0.5 0.6 0.5  PROT 7.3 6.7 6.5 6.1* 6.3*  ALBUMIN 3.0* 2.7* 2.8* 2.7* 2.8*   CBG: Recent Labs  Lab 06/02/20 0839 06/02/20 1139 06/02/20 1705 06/02/20 2033 06/03/20 0835  GLUCAP 227* 388* 294* 260* 253*   Sepsis Labs: Recent Labs  Lab 05/30/20 0734 05/31/20 0654  PROCALCITON <0.10 <0.10    Recent Results (from the past 240 hour(s))  Respiratory Panel by RT PCR (Flu A&B, Covid) - Nasopharyngeal Swab     Status: Abnormal   Collection Time: 05/25/20  1:43 PM   Specimen: Nasopharyngeal Swab  Result Value Ref Range Status   SARS Coronavirus 2 by RT PCR POSITIVE (A) NEGATIVE Final    Comment: RESULT CALLED TO, READ BACK BY AND VERIFIED WITH: RENO, REED AT 1459 ON 05/25/20 BY SS (NOTE) SARS-CoV-2 target nucleic acids are DETECTED.  SARS-CoV-2 RNA is generally detectable in upper respiratory specimens  during the acute phase of infection. Positive results are indicative of the presence of the identified virus, but do not rule out bacterial infection or co-infection with other pathogens not detected by the test. Clinical correlation with patient history and other diagnostic information is necessary to determine patient infection status. The expected result is Negative.  Fact Sheet for Patients:  PinkCheek.be  Fact Sheet for Healthcare Providers: GravelBags.it  This test is not yet approved or cleared by the [place] FDA and  has been authorized for detection and/or diagnosis of SARS-CoV-2 by FDA under an Emergency Use Authorization (EUA).  This EUA will remain in effect (meaning this test can b e used) for the duration of  the COVID-19 declaration  under Section 564(b)(1) of the Act, 21 U.S.C. section 360bbb-3(b)(1), unless the authorization is terminated or revoked sooner.      Influenza A by PCR NEGATIVE NEGATIVE Final   Influenza B by PCR NEGATIVE NEGATIVE Final    Comment: (NOTE) The Xpert Xpress SARS-CoV-2/FLU/RSV assay is intended as an aid in  the diagnosis of influenza from Nasopharyngeal swab specimens and  should not be used as a sole basis for treatment. Nasal washings and  aspirates are unacceptable for Xpert Xpress SARS-CoV-2/FLU/RSV  testing.  Fact Sheet for Patients: PinkCheek.be  Fact Sheet for Healthcare Providers: GravelBags.it  This test is not yet approved or cleared by the [place] FDA and  has been authorized for detection and/or diagnosis of SARS-CoV-2 by  FDA under an Emergency Use Authorization (EUA). This EUA will remain  in effect (meaning this test can be used) for the duration of the  Covid-19 declaration under Section 564(b)(1) of the Act, 21  U.S.C. section 360bbb-3(b)(1), unless the authorization is  terminated or revoked. Performed at [facility], [address] [place], [place] [address]   Culture, blood (Routine X 2) w Reflex to ID Panel     Status: None   Collection Time: 05/26/20 12:43 AM   Specimen: BLOOD  Result Value Ref Range Status   Specimen Description BLOOD BLOOD LEFT ARM  Final   Special Requests   Final    BOTTLES DRAWN AEROBIC AND ANAEROBIC Blood Culture results may not be optimal due to an excessive volume of blood received in culture bottles   Culture   Final    NO GROWTH 5 DAYS Performed at [facility], [address] [place], [place] [address]    Report Status 05/31/2020 FINAL  Final  Culture, blood (Routine X 2) w Reflex to ID Panel     Status: None   Collection Time: 05/26/20 12:43 AM   Specimen: BLOOD  Result Value Ref Range Status   Specimen Description BLOOD BLOOD RIGHT ARM   Final   Special Requests   Final    BOTTLES DRAWN AEROBIC AND ANAEROBIC Blood Culture results may not be optimal due to an excessive volume of blood received in culture bottles   Culture   Final    NO GROWTH 5 DAYS Performed at [facility], [address] [place], [place] [address]    Report Status 05/31/2020 FINAL  Final      Imaging Studies   CT ANGIO CHEST PE W OR WO CONTRAST  Result Date: 06/01/2020 CLINICAL DATA:  COVID-19 positive.  Shortness of breath. EXAM: CT ANGIOGRAPHY CHEST WITH CONTRAST TECHNIQUE: Multidetector CT imaging of the chest was performed using the standard protocol during bolus administration of intravenous contrast. Multiplanar CT image reconstructions and MIPs were obtained to evaluate the vascular anatomy. CONTRAST:  144mL OMNIPAQUE IOHEXOL 350 MG/ML SOLN COMPARISON:  January 09, 2017 FINDINGS: Cardiovascular: Contrast injection is sufficient to demonstrate satisfactory opacification of the pulmonary arteries to the segmental level. Findings are suspicious for subsegmental pulmonary emboli involving the left lower lobe (axial series 6, image 155). There are subsegmental pulmonary emboli involving the right upper lobe (axial series 6, image 76). There is no CT evidence for heart strain. The size of the main pulmonary artery is enlarged, measuring 3.5 cm. Cardiomegaly with coronary artery calcification. There are atherosclerotic changes of the thoracic aorta without evidence for an aneurysm or dissection. Mediastinum/Nodes: -- No mediastinal lymphadenopathy. -- No hilar lymphadenopathy. -- No axillary lymphadenopathy. -- No supraclavicular lymphadenopathy. -- Normal thyroid gland where visualized. -  Unremarkable esophagus. Lungs/Pleura: Diffuse bilateral ground-glass airspace opacities are noted. There is atelectasis at the lung bases. There is no pneumothorax. There is no large pleural effusion. Upper Abdomen: Contrast bolus timing is not optimized for evaluation  of the abdominal organs. The visualized portions of the organs of the upper abdomen are normal. Musculoskeletal: No chest wall abnormality. No bony spinal canal stenosis. Review of the MIP images confirms the above findings. IMPRESSION: 1. There are subsegmental pulmonary emboli involving the right upper lobe and left lower lobe. No CT evidence for heart strain. 2. Diffuse bilateral ground-glass airspace opacities consistent with the patient's history of viral pneumonia. 3. Cardiomegaly with coronary artery disease. 4. Enlarged main pulmonary artery, which can be seen in patients with elevated pulmonary artery pressures. Aortic Atherosclerosis (ICD10-I70.0). These results were called by telephone at the time of interpretation on 06/01/2020 at 4:03 pm to provider [facility] , who verbally  acknowledged these results. Electronically Signed   By: Constance Holster M.D.   On: 06/01/2020 16:01   US Venous Img Lower Bilateral (DVT)  Result Date: 06/01/2020 CLINICAL DATA:  PE EXAM: Bilateral LOWER EXTREMITY VENOUS DOPPLER ULTRASOUND TECHNIQUE: Gray-scale sonography with compression, as well as color and duplex ultrasound, were performed to evaluate the deep venous system(s) from the level of the common femoral vein through the popliteal and proximal calf veins. COMPARISON:  None. FINDINGS: VENOUS Normal compressibility of the common femoral, superficial femoral, and popliteal veins, as well as the visualized calf veins. Visualized portions of profunda femoral vein and great saphenous vein unremarkable. No filling defects to suggest DVT on grayscale or color Doppler imaging. Doppler waveforms show normal direction of venous flow, normal respiratory plasticity and response to augmentation. OTHER None. Limitations: none IMPRESSION: Negative. Electronically Signed   By: Constance Holster M.D.   On: 06/01/2020 17:35   ECHOCARDIOGRAM COMPLETE  Result Date: 06/03/2020    ECHOCARDIOGRAM REPORT   Patient Name:    Gabriella Robinson Date of Exam: 06/03/2020 Medical Rec #:  [id number]     Height:       68.0 in Accession #:    [id number]    Weight:       232.4 lb Date of Birth:  [birth year]    BSA:          2.178 m Patient Age:    40 years      BP:           113/63 mmHg Patient Gender: F             HR:           79 bpm. Exam Location:  [facility] Procedure: 2D Echo, Cardiac Doppler and Color Doppler Indications:     Pulmonary Embolus 415.19  History:         Patient has no prior history of Echocardiogram examinations.                  Risk Factors:Diabetes and Sleep Apnea. HBP.  Sonographer:     Sherrie Sport RDCS (AE) Referring Phys:  [id number] Bonnielee Haff Diagnosing Phys: Yolonda Kida MD  Sonographer Comments: No parasternal window. IMPRESSIONS  1. TDS.  2. Left ventricular ejection fraction, by estimation, is 50 to 55%. The left ventricle has low normal function. The left ventricle has no regional wall motion abnormalities. Left ventricular diastolic parameters are consistent with Grade I diastolic dysfunction (impaired relaxation).  3. Right ventricular systolic function is normal. The right ventricular size is normal.  4. Mild thickening of the both mitral valve leaflet(s).  5. The mitral valve is normal in structure. No evidence of mitral valve regurgitation.  6. The aortic valve is normal in structure. Aortic valve regurgitation is not visualized. FINDINGS  Left Ventricle: Left ventricular ejection fraction, by estimation, is 50 to 55%. The left ventricle has low normal function. The left ventricle has no regional wall motion abnormalities. The left ventricular internal cavity size was normal in size. There is no left ventricular hypertrophy. Left ventricular diastolic parameters are consistent with Grade I diastolic dysfunction (impaired relaxation). Right Ventricle: The right ventricular size is normal. No increase in right ventricular wall thickness. Right ventricular systolic function is normal. Left Atrium: Left atrial size  was normal in size. Right Atrium: Right atrial size was normal in size. Pericardium: The pericardium was not well visualized. Mitral Valve: The mitral valve is normal in structure. There is mild thickening of  the both mitral valve leaflet(s). There is mild calcification of the mitral valve leaflet(s). Mild to moderate mitral annular calcification. No evidence of mitral valve regurgitation. Tricuspid Valve: The tricuspid valve is normal in structure. Tricuspid valve regurgitation is trivial. Aortic Valve: The aortic valve is normal in structure. Aortic valve regurgitation is not visualized. Aortic valve mean gradient measures 2.0 mmHg. Aortic valve peak gradient measures 3.8 mmHg. Aortic valve area, by VTI measures 3.02 cm. Pulmonic Valve: The pulmonic valve was normal in structure. Pulmonic valve regurgitation is not visualized. Aorta: The aortic arch was not well visualized. IAS/Shunts: No atrial level shunt detected by color flow Doppler. Additional Comments: TDS.  LEFT VENTRICLE PLAX 2D LVIDd:         2.98 cm  Diastology LVIDs:         2.38 cm  LV e' medial:    4.03 cm/s LV PW:         1.15 cm  LV E/e' medial:  15.8 LV IVS:        0.99 cm  LV e' lateral:   10.10 cm/s LVOT diam:     2.00 cm  LV E/e' lateral: 6.3 LV SV:         52 LV SV Index:   24 LVOT Area:     3.14 cm  LEFT ATRIUM             Index       RIGHT ATRIUM          Index LA diam:        3.50 cm 1.61 cm/m  RA Area:     8.94 cm LA Vol (A2C):   25.2 ml 11.57 ml/m RA Volume:   16.00 ml 7.34 ml/m LA Vol (A4C):   29.4 ml 13.50 ml/m LA Biplane Vol: 28.1 ml 12.90 ml/m  AORTIC VALVE AV Area (Vmax):    2.35 cm AV Area (Vmean):   2.54 cm AV Area (VTI):     3.02 cm AV Vmax:           96.85 cm/s AV Vmean:          68.750 cm/s AV VTI:            0.170 m AV Peak Grad:      3.8 mmHg AV Mean Grad:      2.0 mmHg LVOT Vmax:         72.50 cm/s LVOT Vmean:        55.500 cm/s LVOT VTI:          0.164 m LVOT/AV VTI ratio: 0.96 MITRAL VALVE MV Area (PHT): 2.83 cm     SHUNTS MV Decel Time: 268 msec    Systemic VTI:  0.16 m MV E velocity: 63.80 cm/s  Systemic Diam: 2.00 cm MV A velocity: 74.60 cm/s MV E/A ratio:  0.86 Dwayne D Callwood MD Electronically signed by Yolonda Kida MD Signature Date/Time: 06/03/2020/9:57:43 AM    Final      Medications   Scheduled Meds: . amLODipine  5 mg Oral Daily  . vitamin C  500 mg Oral Daily  . aspirin EC  81 mg Oral Daily  . atorvastatin  10 mg Oral Daily  . baclofen  5 mg Oral QHS  . dextromethorphan-guaiFENesin  1 tablet Oral BID  . enoxaparin (LOVENOX) injection  1 mg/kg Subcutaneous Q12H  . famotidine  20 mg Oral Daily  . feeding supplement (GLUCERNA SHAKE)  237 mL Oral q1600  . gabapentin  800 mg Oral BID  . influenza vac split quadrivalent PF  0.5 mL Intramuscular Tomorrow-1000  . insulin aspart  0-20 Units Subcutaneous TID WC  . insulin aspart  0-5 Units Subcutaneous QHS  . insulin aspart  20 Units Subcutaneous TID WC  . insulin glargine  28 Units Subcutaneous BID  . ipratropium  2 puff Inhalation Q4H  . losartan  50 mg Oral Daily  . magnesium hydroxide  30 mL Oral Once  . methylPREDNISolone (SOLU-MEDROL) injection  60 mg Intravenous Q12H  . mometasone-formoterol  2 puff Inhalation BID  . polyethylene glycol  17 g Oral Daily  . senna  2 tablet Oral Daily  . tiZANidine  4 mg Oral TID  . topiramate  100 mg Oral BID  . traZODone  100 mg Oral QHS  . zinc sulfate  220 mg Oral Daily   Continuous Infusions: . remdesivir 100 mg in NS 100 mL 100 mg (06/03/20 0948)       LOS: 9 days   Bonnielee Haff,  [facility]  06/03/2020, 10:12 AM    If 7PM-7AM, please contact night-coverage. How to contact the [facility] Attending or Consulting provider [place] or covering provider during after hours [place], for this patient?    1. Check the care team in [facility] and look for a) attending/consulting [facility] provider listed and b) the [facility] team listed 2. Log into [email or web address] and use [place]'s universal  password to access. If you do not have the password, please contact the hospital operator. 3. Locate the [facility] provider you are looking for under [facility] and page to a number that you can be directly reached. 4. If you still have difficulty reaching the provider, please page the [facility] (Director on Call) for the Hospitalists listed on amion for assistance.

## 2020-06-03 NOTE — Progress Notes (Signed)
*  PRELIMINARY RESULTS* Echocardiogram 2D Echocardiogram has been performed.  Sherrie Sport 06/03/2020, 9:31 AM

## 2020-06-04 DIAGNOSIS — J9601 Acute respiratory failure with hypoxia: Secondary | ICD-10-CM | POA: Diagnosis not present

## 2020-06-04 DIAGNOSIS — U071 COVID-19: Secondary | ICD-10-CM | POA: Diagnosis not present

## 2020-06-04 DIAGNOSIS — R739 Hyperglycemia, unspecified: Secondary | ICD-10-CM | POA: Diagnosis not present

## 2020-06-04 DIAGNOSIS — E119 Type 2 diabetes mellitus without complications: Secondary | ICD-10-CM | POA: Diagnosis not present

## 2020-06-04 LAB — CBC
HCT: 38.1 % (ref 36.0–46.0)
Hemoglobin: 12.2 g/dL (ref 12.0–15.0)
MCH: 28.4 pg (ref 26.0–34.0)
MCHC: 32 g/dL (ref 30.0–36.0)
MCV: 88.8 fL (ref 80.0–100.0)
Platelets: 620 10*3/uL — ABNORMAL HIGH (ref 150–400)
RBC: 4.29 MIL/uL (ref 3.87–5.11)
RDW: 14.2 % (ref 11.5–15.5)
WBC: 10.9 10*3/uL — ABNORMAL HIGH (ref 4.0–10.5)
nRBC: 0 % (ref 0.0–0.2)

## 2020-06-04 LAB — MAGNESIUM: Magnesium: 2.3 mg/dL (ref 1.7–2.4)

## 2020-06-04 LAB — GLUCOSE, CAPILLARY
Glucose-Capillary: 300 mg/dL — ABNORMAL HIGH (ref 70–99)
Glucose-Capillary: 387 mg/dL — ABNORMAL HIGH (ref 70–99)
Glucose-Capillary: 204 mg/dL — ABNORMAL HIGH (ref 70–99)
Glucose-Capillary: 376 mg/dL — ABNORMAL HIGH (ref 70–99)

## 2020-06-04 LAB — BASIC METABOLIC PANEL
Anion gap: 7 (ref 5–15)
BUN: 23 mg/dL — ABNORMAL HIGH (ref 6–20)
CO2: 27 mmol/L (ref 22–32)
Calcium: 9.4 mg/dL (ref 8.9–10.3)
Chloride: 102 mmol/L (ref 98–111)
Creatinine, Ser: 0.62 mg/dL (ref 0.44–1.00)
GFR, Estimated: >60 mL/min (ref >60)
Glucose, Bld: 322 mg/dL — ABNORMAL HIGH (ref 70–99)
Potassium: 4.9 mmol/L (ref 3.5–5.1)
Sodium: 136 mmol/L (ref 135–145)

## 2020-06-04 MED ORDER — FUROSEMIDE 10 MG/ML IJ SOLN
40.0000 mg | Freq: Once | INTRAMUSCULAR | Status: AC
  Administered 2020-06-04: 40 mg via INTRAVENOUS
  Filled 2020-06-04: qty 4

## 2020-06-04 MED ORDER — METHYLPREDNISOLONE SODIUM SUCC 40 MG IJ SOLR
40.0000 mg | Freq: Two times a day (BID) | INTRAMUSCULAR | Status: DC
  Administered 2020-06-04 – 2020-06-06 (×5): 40 mg via INTRAVENOUS
  Filled 2020-06-04 (×5): qty 1

## 2020-06-04 NOTE — Progress Notes (Signed)
PROGRESS NOTE    Gabriella Robinson   YNW:[id number]  DOB: May 10, 1961  PCP: Casilda Carls, MD    DOA: 05/25/2020 LOS: 10   Brief Narrative   Gabriella Robinson is a 59 y.o. female with medical history of multiple sclerosis on interferon-beta, hypertension, hyperlipidemia, type 2 diabetes mellitus, migraine headache, opioid dependence, fibromyalgia, neck pain, back pain, who presented to the ED on 05/25/20 with shortness breath, cough, fever and chills, nausea, vomiting, diarrhea x 5 days after close household contact with Covid-19.  Evaluation in the ED - positive Covid-19 PCR. Initial oxygen sat 84% on room air, improved to 90's on 4 L/min.  However, by morning of 10/21, patient requiring heated HFNC at 40 L/min.  Chest xray showed multifocal infiltrates.  Pt had tachycardia, tachypnea as well, meeting criteria for sepsis due to Covid-19 pneumonia.    Initially in HHS with blood glucose of 753.  Started on insulin infusion.  Transitioned to subcutaneous insulin the next morning.      Assessment & Plan   Acute Hypoxic Resp. Failure/Pneumonia due to COVID-19   Recent Labs  Lab 05/29/20 0646 05/30/20 0734 05/31/20 0654 06/01/20 0550 06/02/20 0451  FERRITIN 113 91  --   --   --   CRP 1.6* 0.9  --  0.6  --   ALT 19 26 32 33 31  PROCALCITON  --  <0.10 <0.10  --   --     D Dimer at [facility] ng/ml: [phone]--[phone]--[phone]--5505--4947--3299--2866--2400--2284   Objective findings: Oxygen requirements: Patient now on nasal cannula at 2 L/min.  Saturating in the mid 90s.    COVID 19 Therapeutics: Antibacterials: None.  Procalcitonin less than 0.1 Remdesivir: Completed 10-day course of Remdesivir 10/29 Steroids: Remains on Solu-Medrol 60 mg every 12 hours we will start tapering. Diuretics: Furosemide was given on 10/27, 10/28, 10/29.  We will repeat dose today Inhaled Steroids: Dulera Baricitinib: Patient declined baricitinib PUD Prophylaxis: Pepcid DVT Prophylaxis: On therapeutic  Lovenox  From a respiratory standpoint patient seems to have made quite a significant improvement in the last 24 to 48 hours.  She is now on 2 L of oxygen by nasal cannula from the 40 L that she was on just 2 to 3 days ago.  Diuresis seems to be helping this patient's respiratory status.  We will repeat another dose today.  Continue to mobilize.  She has completed course of Remdesivir.  We will start tapering her steroids.  Patient declined baricitinib.  Continue with incentive spirometry.  Continue with inhaled steroids.  The treatment plan and use of medications and known side effects were discussed with patient/family. Some of the medications used are based on case reports/anecdotal data.  All other medications being used in the management of COVID-19 based on limited study data.  Complete risks and long-term side effects are unknown, however in the best clinical judgment they seem to be of some benefit.  Patient/family wanted to proceed with treatment options provided.  Acute pulmonary embolism CT angiogram did show acute pulmonary embolism without any evidence for right heart strain.  Hemodynamically stable.  Echocardiogram showed normal systolic function the left and right ventricles.  Continue full dose Lovenox for today.  Will consider changing to oral anticoagulation tomorrow.  Lower extremity Doppler studies were negative for DVT.    Severe Sepsis due to Covid infection POA, criteria for sepsis including tachycardia, tachypnea, and known Covid-19 infection. Normal lactic acid.  Acute kidney injury reflects end organ dysfunction.  Patient treated in the  ED with IV fluids and started on above therapies for Covid-19 infection.  Sepsis physiology resolved.  Acute kidney injury Present at admission.  Likely due to hypovolemia along with the use of ARB and HCTZ.  Resolved with IV hydration.  Continue to hold nephrotoxic agents.  Renal function continues to remain stable.  Hyperosmolar  hyperglycemic state (HHS) POA/diabetes mellitus type 2 in obese Presented with glucose 753. Due to infection and possibly noncompliance with medication during illness.  Initially required insulin infusion.   She was subsequently transitioned to subcutaneous insulin.  HbA1c is 11.  Patient on Metformin and glipizide at home which is on hold.   Poorly controlled CBGs are likely due to steroids.  Dose of Lantus was increased 2 days ago.  Plan is to decrease the dose of steroids today which might help.  Will not make any changes to her Lantus dosage today.  Hypokalemia Repleted  History of multiple sclerosis Patient on beta interferon injections 3 times a week.  Currently on hold.  Also noted to be on baclofen and tizanidine.    Essential hypertension Losartan was resumed since kidney function had improved.  Currently on amlodipine as well.  On HCTZ at home which is currently on hold.  Blood pressure is reasonably well controlled.  Hyperlipidemia Continue Lipitor.  Insomnia Continue home trazodone  Obesity Estimated body mass index is 35.34 kg/m as calculated from the following:   Height as of this encounter: 5\' 8"  (1.727 m).   Weight as of this encounter: 105.4 kg.   DVT prophylaxis: Lovenox   Diet:  Diet Orders (From admission, onward)    Start     Ordered   06/01/20 1553  Diet heart healthy/carb modified Room service appropriate? Yes; Fluid consistency: Thin  Diet effective now       Question Answer Comment  Diet-HS Snack? Snack-yes   Room service appropriate? Yes   Fluid consistency: Thin      06/01/20 1552            Code Status: Full Code    Subjective 06/04/20    Patient denies any complaints.  She feels much better.  Feels stronger.  Less short of breath.     Disposition Plan & Communication   Status is: Inpatient  Remains inpatient appropriate because:Inpatient level of care appropriate due to severity of illness.  Patient has significant oxygen  requirements and remains on IV therapies as above.   Dispo: The patient is from: Home              Anticipated d/c is to: SNF              Anticipated d/c date is: 11/1              Patient currently is not medically stable to d/c.   Family Communication:   On 10/27: Patient's daughter Gabriella Robinson and Ramiro [place] were updated. She and her sister were quite confrontational. They were demanding to know why the patient was requiring such high amounts of oxygen. I did my best explain the disease process to them. They then accused me of lying to them about patient being on Lovenox.  The differences between prophylactic and therapeutic doses was explained to them.   On 10/28: Status update was provided to hall even a patient's daughter.  She was apologetic for the conversation that occurred on 10/27.  She mentioned that she will talk to her sister about this.    Consults, Procedures, Significant Events  Consultants:   none  Procedures:   none  Antimicrobials:  Anti-infectives (From admission, onward)   Start     Dose/Rate Route Frequency Ordered Stop   05/30/20 1815  remdesivir 100 mg in sodium chloride 0.9 % 100 mL IVPB        100 mg 200 mL/hr over 30 Minutes Intravenous Daily 05/30/20 1716 06/03/20 1020   05/26/20 1000  remdesivir 100 mg in sodium chloride 0.9 % 100 mL IVPB       "Followed by" Linked Group Details   100 mg 200 mL/hr over 30 Minutes Intravenous Daily 05/25/20 1441 05/29/20 1020   05/25/20 1700  remdesivir 200 mg in sodium chloride 0.9% 250 mL IVPB       "Followed by" Linked Group Details   200 mg 580 mL/hr over 30 Minutes Intravenous Once 05/25/20 1441 05/25/20 1630         Objective   Vitals:   06/03/20 1722 06/03/20 2125 06/04/20 0314 06/04/20 0830  BP: 108/72 129/84 108/76 122/81  Pulse: 95 95 96 90  Resp: 18     Temp: 98.2 F (36.8 C) 99.3 F (37.4 C) 99.8 F (37.7 C) 98.5 F (36.9 C)  TempSrc: Oral Oral Oral Oral  SpO2: 94% 96% 97% 99%  Weight:       Height:        Intake/Output Summary (Last 24 hours) at 06/04/2020 1052 Last data filed at 06/04/2020 0000 Gross per 24 hour  Intake 200 ml  Output 1950 ml  Net -1750 ml   Filed Weights   05/31/20 0517 06/01/20 0433 06/03/20 0617  Weight: 106.2 kg 106 kg 105.4 kg    Physical Exam:  General appearance: Awake alert.  In no distress Resp: Improved effort.  Improved aeration bilaterally.  Few crackles at the bases.  No wheezing or rhonchi.   Cardio: S1-S2 is normal regular.  No S3-S4.  No rubs murmurs or bruit GI: Abdomen is soft.  Nontender nondistended.  Bowel sounds are present normal.  No masses organomegaly Extremities: No edema.  Full range of motion of lower extremities. Neurologic: Alert and oriented x3.  No focal neurological deficits.      Labs   Data Reviewed: I have personally reviewed following labs and imaging studies  CBC: Recent Labs  Lab 05/29/20 0646 05/29/20 0646 05/30/20 0734 05/30/20 0734 05/31/20 0654 06/01/20 0550 06/02/20 0451 06/03/20 0615 06/04/20 0815  WBC 9.7   < > 10.7*   < > 9.5 12.0* 12.4* 9.4 10.9*  NEUTROABS 7.3  --  8.6*  --  7.5 8.3* 8.3*  --   --   HGB 13.5   < > 12.1   < > 11.8* 11.3* 11.7* 11.9* 12.2  HCT 41.5   < > 36.8   < > 37.0 35.2* 36.5 37.2 38.1  MCV 86.1   < > 86.0   < > 88.3 88.4 88.8 89.2 88.8  PLT 553*   < > 583*   < > 562* 574* 575* 592* 620*   < > = values in this interval not displayed.   Basic Metabolic Panel: Recent Labs  Lab 05/29/20 0646 05/29/20 0646 05/30/20 0734 05/30/20 0734 05/31/20 0654 06/01/20 0550 06/02/20 0451 06/03/20 0615 06/04/20 0815  NA 139   < > 136   < > 133* 138 138 137 136  K 3.6   < > 3.6   < > 3.8 3.7 4.1 4.4 4.9  CL 107   < > 104   < >  100 105 102 101 102  CO2 22   < > 24   < > 28 24 28 28 27   GLUCOSE 145*   < > 207*   < > 230* 171* 256* 228* 322*  BUN 21*   < > 24*   < > 23* 20 21* 22* 23*  CREATININE 0.75   < > 0.65   < > 0.64 0.58 0.63 0.57 0.62  CALCIUM 9.5   < > 9.1    < > 8.9 8.9 9.4 9.4 9.4  MG 2.2  --  2.4  --   --   --   --   --  2.3   < > = values in this interval not displayed.   GFR: Estimated Creatinine Clearance: 96.2 mL/min (by C-G formula based on SCr of 0.62 mg/dL). Liver Function Tests: Recent Labs  Lab 05/29/20 0646 05/30/20 0734 05/31/20 0654 06/01/20 0550 06/02/20 0451  AST 20 24 25 26 18   ALT 19 26 32 33 31  ALKPHOS 72 69 69 62 70  BILITOT 0.6 0.7 0.5 0.6 0.5  PROT 7.3 6.7 6.5 6.1* 6.3*  ALBUMIN 3.0* 2.7* 2.8* 2.7* 2.8*   CBG: Recent Labs  Lab 06/03/20 0835 06/03/20 1200 06/03/20 1716 06/03/20 2122 06/04/20 0825  GLUCAP 253* 282* 262* 242* 300*   Sepsis Labs: Recent Labs  Lab 05/30/20 0734 05/31/20 0654  PROCALCITON <0.10 <0.10    Recent Results (from the past 240 hour(s))  Respiratory Panel by RT PCR (Flu A&B, Covid) - Nasopharyngeal Swab     Status: Abnormal   Collection Time: 05/25/20  1:43 PM   Specimen: Nasopharyngeal Swab  Result Value Ref Range Status   SARS Coronavirus 2 by RT PCR POSITIVE (A) NEGATIVE Final    Comment: RESULT CALLED TO, READ BACK BY AND VERIFIED WITH: RENO, REED AT 1459 ON 05/25/20 BY SS (NOTE) SARS-CoV-2 target nucleic acids are DETECTED.  SARS-CoV-2 RNA is generally detectable in upper respiratory specimens  during the acute phase of infection. Positive results are indicative of the presence of the identified virus, but do not rule out bacterial infection or co-infection with other pathogens not detected by the test. Clinical correlation with patient history and other diagnostic information is necessary to determine patient infection status. The expected result is Negative.  Fact Sheet for Patients:  PinkCheek.be  Fact Sheet for Healthcare Providers: GravelBags.it  This test is not yet approved or cleared by the [place] FDA and  has been authorized for detection and/or diagnosis of SARS-CoV-2 by FDA under an  Emergency Use Authorization (EUA).  This EUA will remain in effect (meaning this test can b e used) for the duration of  the COVID-19 declaration under Section 564(b)(1) of the Act, 21 U.S.C. section 360bbb-3(b)(1), unless the authorization is terminated or revoked sooner.      Influenza A by PCR NEGATIVE NEGATIVE Final   Influenza B by PCR NEGATIVE NEGATIVE Final    Comment: (NOTE) The Xpert Xpress SARS-CoV-2/FLU/RSV assay is intended as an aid in  the diagnosis of influenza from Nasopharyngeal swab specimens and  should not be used as a sole basis for treatment. Nasal washings and  aspirates are unacceptable for Xpert Xpress SARS-CoV-2/FLU/RSV  testing.  Fact Sheet for Patients: PinkCheek.be  Fact Sheet for Healthcare Providers: GravelBags.it  This test is not yet approved or cleared by the [place] FDA and  has been authorized for detection and/or diagnosis of SARS-CoV-2 by  FDA under an Emergency Use Authorization (EUA).  This EUA will remain  in effect (meaning this test can be used) for the duration of the  Covid-19 declaration under Section 564(b)(1) of the Act, 21  U.S.C. section 360bbb-3(b)(1), unless the authorization is  terminated or revoked. Performed at [facility], [address] [place], [place] [address]   Culture, blood (Routine X 2) w Reflex to ID Panel     Status: None   Collection Time: 05/26/20 12:43 AM   Specimen: BLOOD  Result Value Ref Range Status   Specimen Description BLOOD BLOOD LEFT ARM  Final   Special Requests   Final    BOTTLES DRAWN AEROBIC AND ANAEROBIC Blood Culture results may not be optimal due to an excessive volume of blood received in culture bottles   Culture   Final    NO GROWTH 5 DAYS Performed at [facility], [address] [place], [place] [address]    Report Status 05/31/2020 FINAL  Final  Culture, blood (Routine X 2) w Reflex to ID Panel      Status: None   Collection Time: 05/26/20 12:43 AM   Specimen: BLOOD  Result Value Ref Range Status   Specimen Description BLOOD BLOOD RIGHT ARM  Final   Special Requests   Final    BOTTLES DRAWN AEROBIC AND ANAEROBIC Blood Culture results may not be optimal due to an excessive volume of blood received in culture bottles   Culture   Final    NO GROWTH 5 DAYS Performed at [facility], [address], [place], [place] [address]    Report Status 05/31/2020 FINAL  Final      Imaging Studies   ECHOCARDIOGRAM COMPLETE  Result Date: 06/03/2020    ECHOCARDIOGRAM REPORT   Patient Name:   ONETTA SPAINHOWER Date of Exam: 06/03/2020 Medical Rec #:  [id number]     Height:       68.0 in Accession #:    [id number]    Weight:       232.4 lb Date of Birth:  [birth year]    BSA:          2.178 m Patient Age:    21 years      BP:           113/63 mmHg Patient Gender: F             HR:           79 bpm. Exam Location:  [facility] Procedure: 2D Echo, Cardiac Doppler and Color Doppler Indications:     Pulmonary Embolus 415.19  History:         Patient has no prior history of Echocardiogram examinations.                  Risk Factors:Diabetes and Sleep Apnea. HBP.  Sonographer:     Sherrie Sport RDCS (AE) Referring Phys:  [id number] Bonnielee Haff Diagnosing Phys: Yolonda Kida MD  Sonographer Comments: No parasternal window. IMPRESSIONS  1. TDS.  2. Left ventricular ejection fraction, by estimation, is 50 to 55%. The left ventricle has low normal function. The left ventricle has no regional wall motion abnormalities. Left ventricular diastolic parameters are consistent with Grade I diastolic dysfunction (impaired relaxation).  3. Right ventricular systolic function is normal. The right ventricular size is normal.  4. Mild thickening of the both mitral valve leaflet(s).  5. The mitral valve is normal in structure. No evidence of mitral valve regurgitation.  6. The aortic valve is normal in structure.  Aortic valve  regurgitation is not visualized. FINDINGS  Left Ventricle: Left ventricular ejection fraction, by estimation, is 50 to 55%. The left ventricle has low normal function. The left ventricle has no regional wall motion abnormalities. The left ventricular internal cavity size was normal in size. There is no left ventricular hypertrophy. Left ventricular diastolic parameters are consistent with Grade I diastolic dysfunction (impaired relaxation). Right Ventricle: The right ventricular size is normal. No increase in right ventricular wall thickness. Right ventricular systolic function is normal. Left Atrium: Left atrial size was normal in size. Right Atrium: Right atrial size was normal in size. Pericardium: The pericardium was not well visualized. Mitral Valve: The mitral valve is normal in structure. There is mild thickening of the both mitral valve leaflet(s). There is mild calcification of the mitral valve leaflet(s). Mild to moderate mitral annular calcification. No evidence of mitral valve regurgitation. Tricuspid Valve: The tricuspid valve is normal in structure. Tricuspid valve regurgitation is trivial. Aortic Valve: The aortic valve is normal in structure. Aortic valve regurgitation is not visualized. Aortic valve mean gradient measures 2.0 mmHg. Aortic valve peak gradient measures 3.8 mmHg. Aortic valve area, by VTI measures 3.02 cm. Pulmonic Valve: The pulmonic valve was normal in structure. Pulmonic valve regurgitation is not visualized. Aorta: The aortic arch was not well visualized. IAS/Shunts: No atrial level shunt detected by color flow Doppler. Additional Comments: TDS.  LEFT VENTRICLE PLAX 2D LVIDd:         2.98 cm  Diastology LVIDs:         2.38 cm  LV e' medial:    4.03 cm/s LV PW:         1.15 cm  LV E/e' medial:  15.8 LV IVS:        0.99 cm  LV e' lateral:   10.10 cm/s LVOT diam:     2.00 cm  LV E/e' lateral: 6.3 LV SV:         52 LV SV Index:   24 LVOT Area:     3.14 cm  LEFT ATRIUM              Index       RIGHT ATRIUM          Index LA diam:        3.50 cm 1.61 cm/m  RA Area:     8.94 cm LA Vol (A2C):   25.2 ml 11.57 ml/m RA Volume:   16.00 ml 7.34 ml/m LA Vol (A4C):   29.4 ml 13.50 ml/m LA Biplane Vol: 28.1 ml 12.90 ml/m  AORTIC VALVE AV Area (Vmax):    2.35 cm AV Area (Vmean):   2.54 cm AV Area (VTI):     3.02 cm AV Vmax:           96.85 cm/s AV Vmean:          68.750 cm/s AV VTI:            0.170 m AV Peak Grad:      3.8 mmHg AV Mean Grad:      2.0 mmHg LVOT Vmax:         72.50 cm/s LVOT Vmean:        55.500 cm/s LVOT VTI:          0.164 m LVOT/AV VTI ratio: 0.96 MITRAL VALVE MV Area (PHT): 2.83 cm    SHUNTS MV Decel Time: 268 msec    Systemic VTI:  0.16 m MV E velocity: 63.80 cm/s  Systemic Diam: 2.00 cm MV A velocity: 74.60 cm/s MV E/A ratio:  0.86 Dwayne D Callwood MD Electronically signed by Yolonda Kida MD Signature Date/Time: 06/03/2020/9:57:43 AM    Final      Medications   Scheduled Meds: . amLODipine  5 mg Oral Daily  . vitamin C  500 mg Oral Daily  . aspirin EC  81 mg Oral Daily  . atorvastatin  10 mg Oral Daily  . baclofen  5 mg Oral QHS  . dextromethorphan-guaiFENesin  1 tablet Oral BID  . enoxaparin (LOVENOX) injection  1 mg/kg Subcutaneous Q12H  . famotidine  20 mg Oral Daily  . feeding supplement (GLUCERNA SHAKE)  237 mL Oral q1600  . gabapentin  800 mg Oral BID  . influenza vac split quadrivalent PF  0.5 mL Intramuscular Tomorrow-1000  . insulin aspart  0-20 Units Subcutaneous TID WC  . insulin aspart  0-5 Units Subcutaneous QHS  . insulin aspart  20 Units Subcutaneous TID WC  . insulin glargine  32 Units Subcutaneous BID  . ipratropium  2 puff Inhalation Q4H  . losartan  50 mg Oral Daily  . magnesium hydroxide  30 mL Oral Once  . methylPREDNISolone (SOLU-MEDROL) injection  60 mg Intravenous Q12H  . mometasone-formoterol  2 puff Inhalation BID  . polyethylene glycol  17 g Oral Daily  . senna  2 tablet Oral Daily  . tiZANidine  4 mg Oral TID   . topiramate  100 mg Oral BID  . traZODone  100 mg Oral QHS  . zinc sulfate  220 mg Oral Daily   Continuous Infusions:      LOS: 10 days   Bonnielee Haff,  [facility]  06/04/2020, 10:52 AM    If 7PM-7AM, please contact night-coverage. How to contact the [facility] Attending or Consulting provider [place] or covering provider during after hours [place], for this patient?    1. Check the care team in [facility] and look for a) attending/consulting TR[facility] provider listed and b) the [facility] team listed 2. Log into [email or web address] and use [place]'s universal password to access. If you do not have the password, please contact the hospital operator. 3. Locate the [facility] provider you are looking for under [facility] and page to a number that you can be directly reached. 4. If you still have difficulty reaching the provider, please page the [facility] (Director on Call) for the Hospitalists listed on amion for assistance.

## 2020-06-04 NOTE — Progress Notes (Signed)
Physical Therapy Treatment Patient Details Name: Gabriella Robinson MRN: [id number] DOB: 18-Nov-1960 Today's Date: 06/04/2020    History of Present Illness Patient presents with COVID, MS, HTN, HLD, DM, Hypersosmolar hyperglycemic state, hypokalemia, AKI, sepsis, fibromyalgia, opiod dependence. At baseline is ind with most ADLs. At this time is now on high flow nasal cannula 45 L/min with Fi02 80%. Patient was independent prior to admission living with family.    PT Comments    Patient agrees to PT treatment. She is MI for bed mobility supine <> sit. She transfers sit to stand with RW, 2 L O2 with O2 saturation decreasing to 88 percent with standing. She is able to stand for 5 minutes x 2 reps with SBA and RW. She keeps RLE knee in flex but when cued to straighten it she is able to extend knee and wB on RLE. She is fatigued and requests to get back to bed. She needs to urinate but her purewick needs to be replaced. Nsg is called to replace the purewick. She will continue to benefit from skilled PT to improve mobility and strength.   Follow Up Recommendations  SNF     Equipment Recommendations  Rolling walker with 5" wheels    Recommendations for Other Services       Precautions / Restrictions Restrictions Weight Bearing Restrictions: No    Mobility  Bed Mobility Overal bed mobility: Modified Independent Bed Mobility: Supine to Sit;Sit to Supine Rolling: Modified independent (Device/Increase time)   Supine to sit: Modified independent (Device/Increase time) Sit to supine: Modified independent (Device/Increase time)      Transfers Overall transfer level: Modified independent Equipment used: Rolling walker (2 wheeled) Transfers: Sit to/from Stand Sit to Stand: Min guard         General transfer comment: RLE flex with standing  Ambulation/Gait                 Stairs             Wheelchair Mobility    Modified Rankin (Stroke Patients Only)        Balance Overall balance assessment: Mild deficits observed, not formally tested Sitting-balance support: Bilateral upper extremity supported;Feet supported Sitting balance-Leahy Scale: Good     Standing balance support: Bilateral upper extremity supported Standing balance-Leahy Scale: Good                              Cognition Arousal/Alertness: Awake/alert Behavior During Therapy: Flat affect Overall Cognitive Status: Difficult to assess                                        Exercises      General Comments        Pertinent Vitals/Pain Pain Assessment: No/denies pain    Home Living                      Prior Function            PT Goals (current goals can now be found in the care plan section) Acute Rehab PT Goals Patient Stated Goal:  (no goals stated) Progress towards PT goals: Progressing toward goals    Frequency    Min 2X/week      PT Plan Current plan remains appropriate    Co-evaluation  AM-PAC PT "6 Clicks" Mobility   Outcome Measure  Help needed turning from your back to your side while in a flat bed without using bedrails?: A Little Help needed moving from lying on your back to sitting on the side of a flat bed without using bedrails?: A Little Help needed moving to and from a bed to a chair (including a wheelchair)?: A Little Help needed standing up from a chair using your arms (e.g., wheelchair or bedside chair)?: A Little Help needed to walk in hospital room?: A Little   6 Click Score: 15    End of Session Equipment Utilized During Treatment: Gait belt Activity Tolerance: Patient tolerated treatment well Patient left: in bed Nurse Communication: Mobility status PT Visit Diagnosis: Unsteadiness on feet (R26.81);Muscle weakness (generalized) (M62.81);Difficulty in walking, not elsewhere classified (R26.2)     Time: 1400-1420 PT Time Calculation (min) (ACUTE ONLY): 20  min  Charges:  $Therapeutic Activity: 8-22 mins                       Alanson Puls, PT DPT 06/04/2020, 3:03 PM

## 2020-06-05 DIAGNOSIS — U071 COVID-19: Secondary | ICD-10-CM | POA: Diagnosis not present

## 2020-06-05 DIAGNOSIS — J9601 Acute respiratory failure with hypoxia: Secondary | ICD-10-CM | POA: Diagnosis not present

## 2020-06-05 DIAGNOSIS — E119 Type 2 diabetes mellitus without complications: Secondary | ICD-10-CM | POA: Diagnosis not present

## 2020-06-05 DIAGNOSIS — R739 Hyperglycemia, unspecified: Secondary | ICD-10-CM | POA: Diagnosis not present

## 2020-06-05 LAB — GLUCOSE, CAPILLARY
Glucose-Capillary: 193 mg/dL — ABNORMAL HIGH (ref 70–99)
Glucose-Capillary: 296 mg/dL — ABNORMAL HIGH (ref 70–99)
Glucose-Capillary: 335 mg/dL — ABNORMAL HIGH (ref 70–99)
Glucose-Capillary: 380 mg/dL — ABNORMAL HIGH (ref 70–99)

## 2020-06-05 LAB — CBC
HCT: 36.2 % (ref 36.0–46.0)
Hemoglobin: 11.6 g/dL — ABNORMAL LOW (ref 12.0–15.0)
MCH: 28.8 pg (ref 26.0–34.0)
MCHC: 32 g/dL (ref 30.0–36.0)
MCV: 89.8 fL (ref 80.0–100.0)
Platelets: 551 10*3/uL — ABNORMAL HIGH (ref 150–400)
RBC: 4.03 MIL/uL (ref 3.87–5.11)
RDW: 14.1 % (ref 11.5–15.5)
WBC: 11.9 10*3/uL — ABNORMAL HIGH (ref 4.0–10.5)
nRBC: 0 % (ref 0.0–0.2)

## 2020-06-05 LAB — BASIC METABOLIC PANEL
Anion gap: 8 (ref 5–15)
BUN: 28 mg/dL — ABNORMAL HIGH (ref 6–20)
CO2: 28 mmol/L (ref 22–32)
Calcium: 9.2 mg/dL (ref 8.9–10.3)
Chloride: 99 mmol/L (ref 98–111)
Creatinine, Ser: 0.61 mg/dL (ref 0.44–1.00)
GFR, Estimated: >60 mL/min (ref >60)
Glucose, Bld: 194 mg/dL — ABNORMAL HIGH (ref 70–99)
Potassium: 4.2 mmol/L (ref 3.5–5.1)
Sodium: 135 mmol/L (ref 135–145)

## 2020-06-05 MED ORDER — RIVAROXABAN 15 MG PO TABS
15.0000 mg | ORAL_TABLET | Freq: Two times a day (BID) | ORAL | Status: DC
  Administered 2020-06-05 – 2020-06-06 (×3): 15 mg via ORAL
  Filled 2020-06-05 (×4): qty 1

## 2020-06-05 MED ORDER — INSULIN ASPART 100 UNIT/ML ~~LOC~~ SOLN
15.0000 [IU] | Freq: Three times a day (TID) | SUBCUTANEOUS | Status: DC
  Administered 2020-06-05 – 2020-06-06 (×5): 15 [IU] via SUBCUTANEOUS
  Filled 2020-06-05 (×5): qty 1

## 2020-06-05 MED ORDER — RIVAROXABAN 20 MG PO TABS: 20.0000 mg | ORAL_TABLET | Freq: Every day | ORAL | Status: DC

## 2020-06-05 NOTE — TOC Progression Note (Signed)
Transition of Care [facility]) - Progression Note    Patient Details  Name: Gabriella Robinson MRN: [id number] Date of Birth: [birth year]  Transition of Care [facility]) CM/SW [place], [place] Phone Number: 06/05/2020, 11:43 AM  Clinical Narrative:     CSW contacted patient's daughter, [name] [phone]) to discuss the recommendation for SNF placement. Patient's daughter wants to talk with her sister to see if that is right for their mother. Awaiting confirmation from daughter.  CSW completed FL2 (awaiting doctor signature).  CSW submitted for PS[facility]. Awaiting PS[facility] decision. CSW submitted H&P, progress notes and awaiting FL2 signature to send FL2 over.  CSW will continue disposition once hearing back from daughters       Expected Discharge Plan and Services                                                 Social Determinants of Health (SDOH) Interventions    Readmission Risk Interventions No flowsheet data found.

## 2020-06-05 NOTE — NC FL2 (Signed)
[place] LEVEL OF CARE SCREENING TOOL     IDENTIFICATION  Patient Name: Gabriella Robinson Birthdate: 24-Apr-1961 Sex: female Admission Date (Current Location): 05/25/2020  [place] and [place] Number:  Gabriella Robinson [id number] [place] and Address:  [facility], [address], [place], [place] [address]      Provider Number: [id number]  Attending Physician Name and Address:  Bonnielee Haff, MD  Relative Name and Phone Number:  Gayla Medicus, patient's daughter [phone])    Current Level of Care: Hospital Recommended Level of Care: [place] Prior Approval Number:    Date Approved/Denied:   PASRR Number: pending approval  Discharge Plan: SNF    Current Diagnoses: Patient Active Problem List   Diagnosis Date Noted  . Pneumonia due to COVID-19 virus 05/25/2020  . HTN (hypertension) 05/25/2020  . HLD (hyperlipidemia) 05/25/2020  . Diabetes mellitus without complication ([place]) 59/16/3846  . Hyperosmolar hyperglycemic state (HHS) ([place]) 05/25/2020  . Hypokalemia 05/25/2020  . AKI (acute kidney injury) ([place]) 05/25/2020  . Acute respiratory failure with hypoxia ([place]) 05/25/2020  . Severe sepsis ([place]) 05/25/2020  . Chronic sacroiliac joint pain (Left) 09/20/2015  . Lumbar spondylosis 09/20/2015  . Encounter for therapeutic drug level monitoring 09/19/2015  . Avitaminosis D 09/19/2015  . Lumbar facet syndrome (Location of Secondary source of pain) (Bilateral) (L>R) 09/19/2015  . Neurogenic pain 08/17/2015  . Fibromyalgia 08/17/2015  . Nocturnal muscle cramps (Bilateral lower extremities) 08/17/2015  . Chronic pain 07/21/2015  . Long term current use of opiate analgesic 07/21/2015  . Long term prescription opiate use 07/21/2015  . Opiate use 07/21/2015  . Opioid dependence, daily use ([place]) 07/21/2015  . Uncomplicated opioid dependence ([place]) 07/21/2015  . Adiposity 07/21/2015  . Chronic neck pain (Location of Primary Source  of Pain) (Right) 07/21/2015  . Chronic cervical radicular pain (Right) (C7/C8 Dermatome) 07/21/2015  . Chronic low back pain (Location of Secondary source of pain) (Left) 07/21/2015  . Neuropathic pain 07/21/2015  . Musculoskeletal pain 07/21/2015  . Myofascial pain 07/21/2015  . Diffuse myofascial pain syndrome 07/21/2015  . Restless leg syndrome 07/21/2015  . Carpal tunnel syndrome 06/09/2015  . Migraine without aura and responsive to treatment 04/21/2015  . Multiple sclerosis ([place]) 10/04/2014  . Hand paresthesia 10/04/2014  . Disordered sleep 10/04/2014  . Enthesopathy of hip (Left) 11/12/2013  . Neuropathy 11/12/2013    Orientation RESPIRATION BLADDER Height & Weight     Self, Time, Situation, Place  O2 Continent Weight: 232 lb 6.4 oz (105.4 kg) Height:  5\' 8"  (172.7 cm)  BEHAVIORAL SYMPTOMS/MOOD NEUROLOGICAL BOWEL NUTRITION STATUS      Continent    AMBULATORY STATUS COMMUNICATION OF NEEDS Skin   Limited Assist Verbally Normal                       Personal Care Assistance Level of Assistance  Bathing, Dressing, Feeding Bathing Assistance: Limited assistance Feeding assistance: Limited assistance Dressing Assistance: Limited assistance     Functional Limitations Info             SPECIAL CARE FACTORS FREQUENCY                       Contractures Contractures Info: Not present    Additional Factors Info  Code Status Code Status Info: FULL             Current Medications (06/05/2020):  This is the current hospital active medication list Current Facility-Administered Medications  Medication Dose Route Frequency Provider Last Rate Last Admin  . acetaminophen (TYLENOL) tablet 650 mg  650 mg Oral Q6H PRN Ivor Costa, MD   650 mg at 05/30/20 2322  . albuterol (VENTOLIN HFA) 108 (90 Base) MCG/ACT inhaler 2 puff  2 puff Inhalation Q4H PRN Ivor Costa, MD      . amLODipine (NORVASC) tablet 5 mg  5 mg Oral Daily Ivor Costa, MD   5 mg at 06/05/20 0900  .  ascorbic acid (VITAMIN C) tablet 500 mg  500 mg Oral Daily Ivor Costa, MD   500 mg at 06/05/20 0900  . aspirin EC tablet 81 mg  81 mg Oral Daily Ivor Costa, MD   81 mg at 06/05/20 0900  . atorvastatin (LIPITOR) tablet 10 mg  10 mg Oral Daily Ivor Costa, MD   10 mg at 06/05/20 0900  . baclofen (LIORESAL) tablet 5 mg  5 mg Oral QHS Ivor Costa, MD   5 mg at 06/04/20 2109  . butalbital-acetaminophen-caffeine (FIORICET) 50-325-40 MG per tablet 1 tablet  1 tablet Oral Q6H PRN Nicole Kindred A, DO   1 tablet at 05/31/20 0509  . dextromethorphan-guaiFENesin (MUCINEX DM) 30-600 MG per 12 hr tablet 1 tablet  1 tablet Oral BID Nicole Kindred A, DO   1 tablet at 06/05/20 0900  . dextrose 50 % solution 0-50 mL  0-50 mL Intravenous PRN Ivor Costa, MD   50 mL at 05/27/20 0518  . enoxaparin (LOVENOX) injection 105 mg  1 mg/kg Subcutaneous Q12H Oswald Hillock, RPH   105 mg at 06/05/20 0102  . famotidine (PEPCID) tablet 20 mg  20 mg Oral Daily Bonnielee Haff, MD   20 mg at 06/05/20 0900  . feeding supplement (GLUCERNA SHAKE) (GLUCERNA SHAKE) liquid 237 mL  237 mL Oral q1600 Nicole Kindred A, DO   237 mL at 06/04/20 1749  . gabapentin (NEURONTIN) capsule 800 mg  800 mg Oral BID Ivor Costa, MD   800 mg at 06/05/20 0900  . guaiFENesin-dextromethorphan (ROBITUSSIN DM) 100-10 MG/5ML syrup 5 mL  5 mL Oral Q4H PRN Nicole Kindred A, DO   5 mL at 05/27/20 1744  . hydrALAZINE (APRESOLINE) injection 5 mg  5 mg Intravenous Q2H PRN Ivor Costa, MD   5 mg at 05/27/20 2313  . influenza vac split quadrivalent PF (FLUARIX) injection 0.5 mL  0.5 mL Intramuscular Tomorrow-1000 Nicole Kindred A, DO      . insulin aspart (novoLOG) injection 0-20 Units  0-20 Units Subcutaneous TID WC Bonnielee Haff, MD   4 Units at 06/05/20 [phone]  . insulin aspart (novoLOG) injection 0-5 Units  0-5 Units Subcutaneous QHS Bonnielee Haff, MD   3 Units at 06/04/20 2108  . insulin aspart (novoLOG) injection 15 Units  15 Units Subcutaneous TID WC  Bonnielee Haff, MD      . insulin glargine (LANTUS) injection 32 Units  32 Units Subcutaneous BID Bonnielee Haff, MD   32 Units at 06/05/20 0900  . ipratropium (ATROVENT HFA) inhaler 2 puff  2 puff Inhalation Q4H Ivor Costa, MD   2 puff at 06/05/20 0901  . losartan (COZAAR) tablet 50 mg  50 mg Oral Daily Nicole Kindred A, DO   50 mg at 06/05/20 0900  . magnesium hydroxide (MILK OF MAGNESIA) suspension 30 mL  30 mL Oral Once Lang Snow, NP      . methylPREDNISolone sodium succinate (SOLU-MEDROL) 40 mg/mL injection 40 mg  40 mg Intravenous Q12H Bonnielee Haff, MD   40  mg at 06/05/20 0335  . mometasone-formoterol (DULERA) 100-5 MCG/ACT inhaler 2 puff  2 puff Inhalation BID Bonnielee Haff, MD   2 puff at 06/05/20 0901  . ondansetron (ZOFRAN) injection 4 mg  4 mg Intravenous Q8H PRN Ivor Costa, MD      . pantoprazole (PROTONIX) EC tablet 40 mg  40 mg Oral Daily PRN Ivor Costa, MD   40 mg at 05/27/20 0931  . phenol (CHLORASEPTIC) mouth spray 1 spray  1 spray Mouth/Throat PRN Nicole Kindred A, DO   1 spray at 06/01/20 0930  . polyethylene glycol (MIRALAX / GLYCOLAX) packet 17 g  17 g Oral Daily Bonnielee Haff, MD   17 g at 06/05/20 0900  . senna (SENOKOT) tablet 17.2 mg  2 tablet Oral Daily Bonnielee Haff, MD   17.2 mg at 06/05/20 0859  . SUMAtriptan (IMITREX) tablet 100 mg  100 mg Oral Q2H PRN Ivor Costa, MD   100 mg at 05/29/20 1810  . tiZANidine (ZANAFLEX) tablet 4 mg  4 mg Oral TID Ivor Costa, MD   4 mg at 06/05/20 0900  . topiramate (TOPAMAX) tablet 100 mg  100 mg Oral BID Ivor Costa, MD   100 mg at 06/05/20 0900  . traZODone (DESYREL) tablet 100 mg  100 mg Oral QHS Nicole Kindred A, DO   100 mg at 06/04/20 2106  . zinc sulfate capsule 220 mg  220 mg Oral Daily Ivor Costa, MD   220 mg at 06/05/20 0900     Discharge Medications: Please see discharge summary for a list of discharge medications.  Relevant Imaging Results:  Relevant Lab Results:   Additional  Information SS#: [id number]  Trecia Rogers, LCSW

## 2020-06-05 NOTE — Progress Notes (Signed)
PROGRESS NOTE    Gabriella Robinson   GGY:[id number]  DOB: [birth year]  PCP: Casilda Carls, MD    DOA: 05/25/2020 LOS: 11   Brief Narrative   Gabriella Robinson is a 59 y.o. female with medical history of multiple sclerosis on interferon-beta, hypertension, hyperlipidemia, type 2 diabetes mellitus, migraine headache, opioid dependence, fibromyalgia, neck pain, back pain, who presented to the ED on 05/25/20 with shortness breath, cough, fever and chills, nausea, vomiting, diarrhea x 5 days after close household contact with Covid-19.  Evaluation in the ED - positive Covid-19 PCR. Initial oxygen sat 84% on room air, improved to 90's on 4 L/min.  However, by morning of 10/21, patient requiring heated HFNC at 40 L/min.  Chest xray showed multifocal infiltrates.  Pt had tachycardia, tachypnea as well, meeting criteria for sepsis due to Covid-19 pneumonia.    Initially in HHS with blood glucose of 753.  Started on insulin infusion.  Transitioned to subcutaneous insulin the next morning.      Assessment & Plan   Acute Hypoxic Resp. Failure/Pneumonia due to COVID-19   Recent Labs  Lab 05/30/20 0734 05/31/20 0654 06/01/20 0550 06/02/20 0451  FERRITIN 91  --   --   --   CRP 0.9  --  0.6  --   ALT 26 32 33 31  PROCALCITON <0.10 <0.10  --   --     D Dimer at [facility] ng/ml: 3941--3274--3443--5505--4947--3299--2866--2400--2284   Objective findings: Oxygen requirements: Patient remains on 2 L of oxygen by nasal cannula.  Saturating in the mid 90s.    COVID 19 Therapeutics: Antibacterials: None.  Procalcitonin less than 0.1 Remdesivir: Completed 10-day course of Remdesivir 10/29 Steroids: Steroids being tapered. Diuretics: Furosemide was given on 10/27, 10/28, 10/29, 10/30.  Hold off on further doses of diuretics for now. Inhaled Steroids: Dulera Baricitinib: Patient declined baricitinib PUD Prophylaxis: Pepcid DVT Prophylaxis: On therapeutic Lovenox  From a respiratory standpoint patient  has made significant improvement.  She was initially requiring 40 L of oxygen by heated high flow.  Now she is down to 2 L of oxygen.  Saturations are noted to be in the mid to late 90s.  She has completed 10 day course of Remdesivir.  She declined baricitinib.  Steroids being tapered down.  No furosemide today.  Continue Dulera.  Continue incentive spirometry mobilization.  Inhaled steroids.  The treatment plan and use of medications and known side effects were discussed with patient/family. Some of the medications used are based on case reports/anecdotal data.  All other medications being used in the management of COVID-19 based on limited study data.  Complete risks and long-term side effects are unknown, however in the best clinical judgment they seem to be of some benefit.  Patient/family wanted to proceed with treatment options provided.  Acute pulmonary embolism CT angiogram did show acute pulmonary embolism without any evidence for right heart strain.  Hemodynamically stable.  Echocardiogram showed normal systolic function the left and right ventricles.  Since her respiratory status remains stable we will transition her to rivaroxaban today.  Lower extremity Doppler studies were negative for DVT.    Severe Sepsis due to Covid infection POA, criteria for sepsis including tachycardia, tachypnea, and known Covid-19 infection. Normal lactic acid.  Acute kidney injury reflects end organ dysfunction.  Patient treated in the ED with IV fluids and started on above therapies for Covid-19 infection.  Sepsis physiology resolved.  Acute kidney injury Present at admission.  Likely due to hypovolemia along  with the use of ARB and HCTZ.  Resolved with IV hydration.  Continue to hold nephrotoxic agents.  Renal function continues to remain stable.  Diabetes mellitus type 2, uncontrolled with hyperglycemia/Hyperosmolar hyperglycemic state (HHS) POA Presented with glucose 753. Due to infection and possibly  noncompliance with medication during illness.  Initially required insulin infusion.   She was subsequently transitioned to subcutaneous insulin.  HbA1c is 11.  Patient on Metformin and glipizide at home which is on hold.   CBG should improve as steroid is tapered down.  Dose of Lantus was increased 3 days ago.  Continue current dose for now.  Anticipate decrease in CBG over the next 24 to 48 hours.    Hypokalemia Repleted  History of multiple sclerosis Patient on beta interferon injections 3 times a week.  Currently on hold.  Also noted to be on baclofen and tizanidine.    Essential hypertension Losartan was resumed since kidney function had improved.  Also noted to be on amlodipine.  Low blood pressure readings noted over the last 24 hours.  Likely could be secondary to furosemide.  Not being given today.  Monitor for now.  May need to stop one of her antihypertensives.  She however remains asymptomatic.  Hyperlipidemia Continue Lipitor.  Insomnia Continue home trazodone  Obesity Estimated body mass index is 35.34 kg/m as calculated from the following:   Height as of this encounter: 5\' 8"  (1.727 m).   Weight as of this encounter: 105.4 kg.   DVT prophylaxis: Lovenox   Diet:  Diet Orders (From admission, onward)    Start     Ordered   06/01/20 1553  Diet heart healthy/carb modified Room service appropriate? Yes; Fluid consistency: Thin  Diet effective now       Question Answer Comment  Diet-HS Snack? Snack-yes   Room service appropriate? Yes   Fluid consistency: Thin      06/01/20 1552            Code Status: Full Code    Subjective 06/05/20    Denies any new complaints.  Upset with the physical therapist who apparently did not mobilize her yesterday.  Shortness of breath has improved.     Disposition Plan & Communication   Status is: Inpatient  Remains inpatient appropriate because:Inpatient level of care appropriate due to severity of illness.  Patient has  significant oxygen requirements and remains on IV therapies as above.   Dispo: The patient is from: Home              Anticipated d/c is to: SNF              Anticipated d/c date is: 11/1              Patient currently is not medically stable to d/c.   Family Communication:   On 10/27: Patient's daughter Gabriella Robinson and Ramiro Harvest were updated. She and her sister were quite confrontational. They were demanding to know why the patient was requiring such high amounts of oxygen. I did my best explain the disease process to them. They then accused me of lying to them about patient being on Lovenox.  The differences between prophylactic and therapeutic doses was explained to them.   On 10/28: Status update was provided to hall even a patient's daughter.  She was apologetic for the conversation that occurred on 10/27.  She mentioned that she will talk to her sister about this.  On 10/30: Daughter was updated.    Consults,  Procedures, Significant Events   Consultants:   none  Procedures:   none  Antimicrobials:  Anti-infectives (From admission, onward)   Start     Dose/Rate Route Frequency Ordered Stop   05/30/20 1815  remdesivir 100 mg in sodium chloride 0.9 % 100 mL IVPB        100 mg 200 mL/hr over 30 Minutes Intravenous Daily 05/30/20 1716 06/03/20 1020   05/26/20 1000  remdesivir 100 mg in sodium chloride 0.9 % 100 mL IVPB       "Followed by" Linked Group Details   100 mg 200 mL/hr over 30 Minutes Intravenous Daily 05/25/20 1441 05/29/20 1020   05/25/20 1700  remdesivir 200 mg in sodium chloride 0.9% 250 mL IVPB       "Followed by" Linked Group Details   200 mg 580 mL/hr over 30 Minutes Intravenous Once 05/25/20 1441 05/25/20 1630         Objective   Vitals:   06/04/20 1740 06/04/20 2042 06/05/20 0336 06/05/20 0804  BP: 103/74 (!) 97/54 93/67 106/66  Pulse: (!) 108 99 83 89  Resp: 19 18  17   Temp: 97.7 F (36.5 C) 98.3 F (36.8 C) 98.9 F (37.2 C) 97.8 F (36.6 C)    TempSrc: Oral Oral Oral Oral  SpO2: 100% 96% 98% 95%  Weight:      Height:        Intake/Output Summary (Last 24 hours) at 06/05/2020 1105 Last data filed at 06/05/2020 0805 Gross per 24 hour  Intake --  Output 1650 ml  Net -1650 ml   Filed Weights   05/31/20 0517 06/01/20 0433 06/03/20 0617  Weight: 106.2 kg 106 kg 105.4 kg    Physical Exam:  General appearance: Awake alert.  In no distress Resp: Improved effort.  Coarse breath sounds with a few crackles at the bases.  Much improved aeration. Cardio: S1-S2 is normal regular.  No S3-S4.  No rubs murmurs or bruit GI: Abdomen is soft.  Nontender nondistended.  Bowel sounds are present normal.  No masses organomegaly Extremities: No edema.  Able to move all her extremities Neurologic: Alert and oriented x3.  No focal neurological deficits.       Labs   Data Reviewed: I have personally reviewed following labs and imaging studies  CBC: Recent Labs  Lab 05/30/20 0734 05/30/20 0734 05/31/20 0654 05/31/20 0654 06/01/20 0550 06/02/20 0451 06/03/20 0615 06/04/20 0815 06/05/20 0508  WBC 10.7*   < > 9.5   < > 12.0* 12.4* 9.4 10.9* 11.9*  NEUTROABS 8.6*  --  7.5  --  8.3* 8.3*  --   --   --   HGB 12.1   < > 11.8*   < > 11.3* 11.7* 11.9* 12.2 11.6*  HCT 36.8   < > 37.0   < > 35.2* 36.5 37.2 38.1 36.2  MCV 86.0   < > 88.3   < > 88.4 88.8 89.2 88.8 89.8  PLT 583*   < > 562*   < > 574* 575* 592* 620* 551*   < > = values in this interval not displayed.   Basic Metabolic Panel: Recent Labs  Lab 05/30/20 0734 05/31/20 0654 06/01/20 0550 06/02/20 0451 06/03/20 0615 06/04/20 0815 06/05/20 0508  NA 136   < > 138 138 137 136 135  K 3.6   < > 3.7 4.1 4.4 4.9 4.2  CL 104   < > 105 102 101 102 99  CO2 24   < >  24 28 28 27 28   GLUCOSE 207*   < > 171* 256* 228* 322* 194*  BUN 24*   < > 20 21* 22* 23* 28*  CREATININE 0.65   < > 0.58 0.63 0.57 0.62 0.61  CALCIUM 9.1   < > 8.9 9.4 9.4 9.4 9.2  MG 2.4  --   --   --   --  2.3   --    < > = values in this interval not displayed.   GFR: Estimated Creatinine Clearance: 96.2 mL/min (by C-G formula based on SCr of 0.61 mg/dL). Liver Function Tests: Recent Labs  Lab 05/30/20 0734 05/31/20 0654 06/01/20 0550 06/02/20 0451  AST 24 25 26 18   ALT 26 32 33 31  ALKPHOS 69 69 62 70  BILITOT 0.7 0.5 0.6 0.5  PROT 6.7 6.5 6.1* 6.3*  ALBUMIN 2.7* 2.8* 2.7* 2.8*   CBG: Recent Labs  Lab 06/04/20 0825 06/04/20 1148 06/04/20 1736 06/04/20 2049 06/05/20 0801  GLUCAP 300* 387* 204* 376* 193*   Sepsis Labs: Recent Labs  Lab 05/30/20 0734 05/31/20 0654  PROCALCITON <0.10 <0.10    No results found for this or any previous visit (from the past 240 hour(s)).    Imaging Studies   No results found.   Medications   Scheduled Meds: . amLODipine  5 mg Oral Daily  . vitamin C  500 mg Oral Daily  . aspirin EC  81 mg Oral Daily  . atorvastatin  10 mg Oral Daily  . baclofen  5 mg Oral QHS  . dextromethorphan-guaiFENesin  1 tablet Oral BID  . enoxaparin (LOVENOX) injection  1 mg/kg Subcutaneous Q12H  . famotidine  20 mg Oral Daily  . feeding supplement (GLUCERNA SHAKE)  237 mL Oral q1600  . gabapentin  800 mg Oral BID  . influenza vac split quadrivalent PF  0.5 mL Intramuscular Tomorrow-1000  . insulin aspart  0-20 Units Subcutaneous TID WC  . insulin aspart  0-5 Units Subcutaneous QHS  . insulin aspart  20 Units Subcutaneous TID WC  . insulin glargine  32 Units Subcutaneous BID  . ipratropium  2 puff Inhalation Q4H  . losartan  50 mg Oral Daily  . magnesium hydroxide  30 mL Oral Once  . methylPREDNISolone (SOLU-MEDROL) injection  40 mg Intravenous Q12H  . mometasone-formoterol  2 puff Inhalation BID  . polyethylene glycol  17 g Oral Daily  . senna  2 tablet Oral Daily  . tiZANidine  4 mg Oral TID  . topiramate  100 mg Oral BID  . traZODone  100 mg Oral QHS  . zinc sulfate  220 mg Oral Daily   Continuous Infusions:      LOS: 11 days   Bonnielee Haff,  [facility]  06/05/2020, 11:05 AM    If 7PM-7AM, please contact night-coverage. How to contact the [facility] Attending or Consulting provider [place] or covering provider during after hours [place], for this patient?    1. Check the care team in [facility] and look for a) attending/consulting TRH provider listed and b) the [facility] team listed 2. Log into [email or web address] and use [place]'s universal password to access. If you do not have the password, please contact the hospital operator. 3. Locate the [facility] provider you are looking for under [facility] and page to a number that you can be directly reached. 4. If you still have difficulty reaching the provider, please page the [facility] (Director on Call) for the Hospitalists  listed on amion for assistance.

## 2020-06-05 NOTE — Progress Notes (Signed)
ANTICOAGULATION CONSULT NOTE  Pharmacy Consult for enoxaparin to rivaroxaban Indication: pulmonary embolus  Patient Measurements: Height: 5\' 8"  (172.7 cm) Weight: 105.4 kg (232 lb 6.4 oz) IBW/kg (Calculated) : 63.9  Labs: Recent Labs    06/03/20 0615 06/03/20 0615 06/04/20 0815 06/05/20 0508  HGB 11.9*   < > 12.2 11.6*  HCT 37.2  --  38.1 36.2  PLT 592*  --  620* 551*  CREATININE 0.57  --  0.62 0.61   < > = values in this interval not displayed.    Estimated Creatinine Clearance: 96.2 mL/min (by C-G formula based on SCr of 0.61 mg/dL).   Medical History: Past Medical History:  Diagnosis Date  . Chronic back pain   . Diabetes ([place])   . HBP (high blood pressure)   . History of blood clots   . Lumbar canal stenosis 11/12/2013  . Migraines   . MS (multiple sclerosis) ([place])   . Sinus complaint   . Sleep apnea     Assessment: 59yo female with medical history ofmultiple sclerosis on interferon-beta, HTN, HLD, T2DM, migraine headache, opioid dependence, fibromyalgia, neck pain, back pain, who presented to the ED on 05/25/20 with shortness breath - patient is COVID-19 positive. 10/27 CT chest revealed subsegmental PE involving R and L lower lobe. H/H slightly low, but stable, Plt 574, D-dimer trending down. Patient was previously on therapeutic dosing of enoxaparin. Pharmacy has now been consulted to transition to rivaroxaban.  Today, Hgb trended down to low normal of 11.6 but plt remain >500. No overt bleeding noted. Last dose of treatment dose enoxaparin was 10/31 @ 0600.   Plan:  Start rivaroxaban 15mg  PO BID on 10/31 Then on 11/21, decrease to rivaroxaban 20mg  PO with evening meal Continue to monitor for signs/symptoms of bleeding   Brendolyn Patty, PharmD Clinical Pharmacist  06/05/2020   11:21 AM

## 2020-06-05 NOTE — TOC Progression Note (Addendum)
Transition of Care [facility]) - Progression Note    Patient Details  Name: Gabriella Robinson MRN: [id number] Date of Birth: 01-19-1961  Transition of Care [facility]) CM/SW [place], [place] Phone Number: 06/05/2020, 12:18 PM  Clinical Narrative:      CSW received a call from patient's daughter, [name]. Pt's daughter states that her and her other sister do not want SNF due to how her great grandmother was treated. Patient's daughter stated that she is confused because the doctor expressed to her mother that she is doing better and can go home with home health but everyone else is recommending SNF. Patient and her daughters are not wanting SNF and rather go home with home health. Patient's daughter feels that the hospital just wants to discharge her mother and states that PT has not helped her gain her strength back.   CSW sent message to doctor. Doctor replied stating that patient will be discharged on Monday with home health and O2 oxyen. CSW called daughter to update and daughter agreed with recommendation.       Expected Discharge Plan and Services                                                 Social Determinants of Health (SDOH) Interventions    Readmission Risk Interventions No flowsheet data found.

## 2020-06-06 DIAGNOSIS — J1282 Pneumonia due to coronavirus disease 2019: Secondary | ICD-10-CM | POA: Diagnosis not present

## 2020-06-06 DIAGNOSIS — U071 COVID-19: Secondary | ICD-10-CM | POA: Diagnosis not present

## 2020-06-06 LAB — CBC
HCT: 34.5 % — ABNORMAL LOW (ref 36.0–46.0)
Hemoglobin: 11 g/dL — ABNORMAL LOW (ref 12.0–15.0)
MCH: 28.8 pg (ref 26.0–34.0)
MCHC: 31.9 g/dL (ref 30.0–36.0)
MCV: 90.3 fL (ref 80.0–100.0)
Platelets: 542 10*3/uL — ABNORMAL HIGH (ref 150–400)
RBC: 3.82 MIL/uL — ABNORMAL LOW (ref 3.87–5.11)
RDW: 14 % (ref 11.5–15.5)
WBC: 11.9 10*3/uL — ABNORMAL HIGH (ref 4.0–10.5)
nRBC: 0 % (ref 0.0–0.2)

## 2020-06-06 LAB — BASIC METABOLIC PANEL
Anion gap: 7 (ref 5–15)
BUN: 29 mg/dL — ABNORMAL HIGH (ref 6–20)
CO2: 29 mmol/L (ref 22–32)
Calcium: 9.2 mg/dL (ref 8.9–10.3)
Chloride: 100 mmol/L (ref 98–111)
Creatinine, Ser: 0.74 mg/dL (ref 0.44–1.00)
GFR, Estimated: >60 mL/min (ref >60)
Glucose, Bld: 178 mg/dL — ABNORMAL HIGH (ref 70–99)
Potassium: 4.4 mmol/L (ref 3.5–5.1)
Sodium: 136 mmol/L (ref 135–145)

## 2020-06-06 LAB — GLUCOSE, CAPILLARY
Glucose-Capillary: 174 mg/dL — ABNORMAL HIGH (ref 70–99)
Glucose-Capillary: 295 mg/dL — ABNORMAL HIGH (ref 70–99)
Glucose-Capillary: 191 mg/dL — ABNORMAL HIGH (ref 70–99)

## 2020-06-06 MED ORDER — BLOOD GLUCOSE MONITOR KIT: PACK | 0 refills | Status: DC

## 2020-06-06 MED ORDER — GUAIFENESIN-DM 100-10 MG/5ML PO SYRP: 5.0000 mL | ORAL_SOLUTION | ORAL | 0 refills | Status: DC | PRN

## 2020-06-06 MED ORDER — INSULIN GLARGINE 100 UNIT/ML SOLOSTAR PEN: 10.0000 [IU] | PEN_INJECTOR | Freq: Every day | SUBCUTANEOUS | 1 refills | Status: AC

## 2020-06-06 MED ORDER — PREDNISONE 20 MG PO TABS: ORAL_TABLET | ORAL | 0 refills | Status: DC

## 2020-06-06 MED ORDER — RIVAROXABAN 20 MG PO TABS
20.0000 mg | ORAL_TABLET | Freq: Every day | ORAL | 2 refills | Status: DC
Start: 2020-07-03 — End: 2021-06-14

## 2020-06-06 MED ORDER — ALBUTEROL SULFATE HFA 108 (90 BASE) MCG/ACT IN AERS: 2.0000 | INHALATION_SPRAY | RESPIRATORY_TRACT | 0 refills | Status: DC | PRN

## 2020-06-06 MED ORDER — INSULIN STARTER KIT- PEN NEEDLES (ENGLISH)
1.0000 | Freq: Once | Status: AC
  Administered 2020-06-06: 1
  Filled 2020-06-06: qty 1

## 2020-06-06 MED ORDER — FAMOTIDINE 20 MG PO TABS: 20.0000 mg | ORAL_TABLET | Freq: Every day | ORAL | 0 refills | Status: AC

## 2020-06-06 MED ORDER — RIVAROXABAN (XARELTO) VTE STARTER PACK (15 & 20 MG): ORAL_TABLET | ORAL | 0 refills | Status: DC

## 2020-06-06 MED ORDER — INSULIN PEN NEEDLE 30G X 8 MM MISC: 1.0000 | 1 refills | Status: DC | PRN

## 2020-06-06 MED ORDER — MOMETASONE FURO-FORMOTEROL FUM 100-5 MCG/ACT IN AERO: 2.0000 | INHALATION_SPRAY | Freq: Two times a day (BID) | RESPIRATORY_TRACT | 0 refills | Status: DC

## 2020-06-06 MED ORDER — POLYETHYLENE GLYCOL 3350 17 G PO PACK: 17.0000 g | PACK | Freq: Every day | ORAL | 0 refills | Status: DC

## 2020-06-06 NOTE — Care Management Important Message (Signed)
Important Message  Patient Details  Name: Gabriella Robinson MRN: [id number] Date of Birth: 1961/01/18   Medicare Important Message Given:  Other (see comment)  Attempted to review Medicare IM with patient twice via room phone due to isolation status, however no answer upon both attempts.  Two attempts made to reach patient via cell, though both tries went straight to voicemail.    Dannette Barbara 06/06/2020, 12:20 PM

## 2020-06-06 NOTE — Progress Notes (Signed)
Physical Therapy Treatment Patient Details Name: Gabriella Robinson MRN: [id number] DOB: Mar 10, 1961 Today's Date: 06/06/2020    History of Present Illness Patient presents with COVID, MS, HTN, HLD, DM, Hypersosmolar hyperglycemic state, hypokalemia, AKI, sepsis, fibromyalgia, opiod dependence. At baseline is ind with most ADLs. At this time is now on high flow nasal cannula 45 L/min with Fi02 80%. Patient was independent prior to admission living with family.    PT Comments    Pt seen prior to anticipated discharge today.  Sitting EOB upon arrival and reports feeling better.  Standing ex as below.  She is able to walk to door and back x 2 then again x 1 with RW and min guard x 2.  She does well with no buckling or LOB but weakness in LE's is noted and limited by fatigue.  Discussed discharge.  She has 3 flights of stairs to her apartment and no elevator.  Given limited gait and LE weakness stairs will likely pose a problem for her.  Discussed putting a chair on each landing to rest or at worst sitting on steps to rest if she is fatigued.  Voice understanding.  She is open to EMS transfer home but per SWS it is not an option since she is able to walk some.  Discussed with RN and team regarding concerns.  Pt states she does wish to return home vs SNF and feels she has enough family support at home.   Follow Up Recommendations  SNF     Equipment Recommendations  Rolling walker with 5" wheels;3in1 (PT)    Recommendations for Other Services       Precautions / Restrictions Precautions Precautions: Fall    Mobility  Bed Mobility               General bed mobility comments: sittign EOB on her own upon arrival  Transfers Overall transfer level: Needs assistance Equipment used: Rolling walker (2 wheeled) Transfers: Sit to/from Stand Sit to Stand: Min guard            Ambulation/Gait Ambulation/Gait assistance: Min guard;+2 safety/equipment Gait Distance (Feet): 40  Feet Assistive device: Rolling walker (2 wheeled) Gait Pattern/deviations: Step-through pattern;Decreased step length - right;Decreased step length - left;Trunk flexed Gait velocity: decreased   General Gait Details: 2 laps in room x 1 , 1 lap in room x 1 limited by fatigue   Stairs             Wheelchair Mobility    Modified Rankin (Stroke Patients Only)       Balance Overall balance assessment: Needs assistance Sitting-balance support: Feet supported Sitting balance-Leahy Scale: Good     Standing balance support: Bilateral upper extremity supported Standing balance-Leahy Scale: Good Standing balance comment: does well with walker but weakness increases fall risk.                            Cognition Arousal/Alertness: Awake/alert Behavior During Therapy: WFL for tasks assessed/performed Overall Cognitive Status: Within Functional Limits for tasks assessed                                        Exercises Other Exercises Other Exercises: standing ex with walker x 20 heel raises, SLR and marches    General Comments        Pertinent Vitals/Pain Pain Assessment: No/denies pain  Home Living                      Prior Function            PT Goals (current goals can now be found in the care plan section) Progress towards PT goals: Progressing toward goals    Frequency    Min 2X/week      PT Plan Current plan remains appropriate;Other (comment)    Co-evaluation              AM-PAC PT "6 Clicks" Mobility   Outcome Measure  Help needed turning from your back to your side while in a flat bed without using bedrails?: A Little Help needed moving from lying on your back to sitting on the side of a flat bed without using bedrails?: A Little Help needed moving to and from a bed to a chair (including a wheelchair)?: A Little Help needed standing up from a chair using your arms (e.g., wheelchair or bedside  chair)?: A Little Help needed to walk in hospital room?: A Little Help needed climbing 3-5 steps with a railing? : A Lot 6 Click Score: 17    End of Session Equipment Utilized During Treatment: Gait belt Activity Tolerance: Patient tolerated treatment well Patient left: in bed;Other (comment);with call bell/phone within reach Nurse Communication: Mobility status       Time: 7092-9574 PT Time Calculation (min) (ACUTE ONLY): 40 min  Charges:  $Gait Training: 8-22 mins $Therapeutic Exercise: 8-22 mins $Therapeutic Activity: 8-22 mins                    Chesley Noon, PTA 06/06/20, 2:18 PM

## 2020-06-06 NOTE — Care Management Important Message (Signed)
Important Message  Patient Details  Name: Gabriella Robinson MRN: [id number] Date of Birth: April 20, 1961   Medicare Important Message Given:  Yes  Patient returned call.  Reviewed Medicare IM.  Copy to be delivered to patient via nurse due to isolation status.     Dannette Barbara 06/06/2020, 1:30 PM

## 2020-06-06 NOTE — Discharge Instructions (Signed)
 COVID-19 COVID-19 is a respiratory infection that is caused by a virus called severe acute respiratory syndrome coronavirus 2 (SARS-CoV-2). The disease is also known as coronavirus disease or novel coronavirus. In some people, the virus may not cause any symptoms. In others, it may cause a serious infection. The infection can get worse quickly and can lead to complications, such as:  Pneumonia, or infection of the lungs.  Acute respiratory distress syndrome or ARDS. This is a condition in which fluid build-up in the lungs prevents the lungs from filling with air and passing oxygen into the blood.  Acute respiratory failure. This is a condition in which there is not enough oxygen passing from the lungs to the body or when carbon dioxide is not passing from the lungs out of the body.  Sepsis or septic shock. This is a serious bodily reaction to an infection.  Blood clotting problems.  Secondary infections due to bacteria or fungus.  Organ failure. This is when your body's organs stop working. The virus that causes COVID-19 is contagious. This means that it can spread from person to person through droplets from coughs and sneezes (respiratory secretions). What are the causes? This illness is caused by a virus. You may catch the virus by:  Breathing in droplets from an infected person. Droplets can be spread by a person breathing, speaking, singing, coughing, or sneezing.  Touching something, like a table or a doorknob, that was exposed to the virus (contaminated) and then touching your mouth, nose, or eyes. What increases the risk? Risk for infection You are more likely to be infected with this virus if you:  Are within 6 feet (2 meters) of a person with COVID-19.  Provide care for or live with a person who is infected with COVID-19.  Spend time in crowded indoor spaces or live in shared housing. Risk for serious illness You are more likely to become seriously ill from the virus if  you:  Are 50 years of age or older. The higher your age, the more you are at risk for serious illness.  Live in a nursing home or long-term care facility.  Have cancer.  Have a long-term (chronic) disease such as: ? Chronic lung disease, including chronic obstructive pulmonary disease or asthma. ? A long-term disease that lowers your body's ability to fight infection (immunocompromised). ? Heart disease, including heart failure, a condition in which the arteries that lead to the heart become narrow or blocked (coronary artery disease), a disease which makes the heart muscle thick, weak, or stiff (cardiomyopathy). ? Diabetes. ? Chronic kidney disease. ? Sickle cell disease, a condition in which red blood cells have an abnormal "sickle" shape. ? Liver disease.  Are obese. What are the signs or symptoms? Symptoms of this condition can range from mild to severe. Symptoms may appear any time from 2 to 14 days after being exposed to the virus. They include:  A fever or chills.  A cough.  Difficulty breathing.  Headaches, body aches, or muscle aches.  Runny or stuffy (congested) nose.  A sore throat.  New loss of taste or smell. Some people may also have stomach problems, such as nausea, vomiting, or diarrhea. Other people may not have any symptoms of COVID-19. How is this diagnosed? This condition may be diagnosed based on:  Your signs and symptoms, especially if: ? You live in an area with a COVID-19 outbreak. ? You recently traveled to or from an area where the virus is common. ?   You provide care for or live with a person who was diagnosed with COVID-19. ? You were exposed to a person who was diagnosed with COVID-19.  A physical exam.  Lab tests, which may include: ? Taking a sample of fluid from the back of your nose and throat (nasopharyngeal fluid), your nose, or your throat using a swab. ? A sample of mucus from your lungs (sputum). ? Blood tests.  Imaging tests,  which may include, X-rays, CT scan, or ultrasound. How is this treated? At present, there is no medicine to treat COVID-19. Medicines that treat other diseases are being used on a trial basis to see if they are effective against COVID-19. Your health care provider will talk with you about ways to treat your symptoms. For most people, the infection is mild and can be managed at home with rest, fluids, and over-the-counter medicines. Treatment for a serious infection usually takes places in a hospital intensive care unit (ICU). It may include one or more of the following treatments. These treatments are given until your symptoms improve.  Receiving fluids and medicines through an IV.  Supplemental oxygen. Extra oxygen is given through a tube in the nose, a face mask, or a hood.  Positioning you to lie on your stomach (prone position). This makes it easier for oxygen to get into the lungs.  Continuous positive airway pressure (CPAP) or bi-level positive airway pressure (BPAP) machine. This treatment uses mild air pressure to keep the airways open. A tube that is connected to a motor delivers oxygen to the body.  Ventilator. This treatment moves air into and out of the lungs by using a tube that is placed in your windpipe.  Tracheostomy. This is a procedure to create a hole in the neck so that a breathing tube can be inserted.  Extracorporeal membrane oxygenation (ECMO). This procedure gives the lungs a chance to recover by taking over the functions of the heart and lungs. It supplies oxygen to the body and removes carbon dioxide. Follow these instructions at home: Lifestyle  If you are sick, stay home except to get medical care. Your health care provider will tell you how long to stay home. Call your health care provider before you go for medical care.  Rest at home as told by your health care provider.  Do not use any products that contain nicotine or tobacco, such as cigarettes,  e-cigarettes, and chewing tobacco. If you need help quitting, ask your health care provider.  Return to your normal activities as told by your health care provider. Ask your health care provider what activities are safe for you. General instructions  Take over-the-counter and prescription medicines only as told by your health care provider.  Drink enough fluid to keep your urine pale yellow.  Keep all follow-up visits as told by your health care provider. This is important. How is this prevented?  There is no vaccine to help prevent COVID-19 infection. However, there are steps you can take to protect yourself and others from this virus. To protect yourself:   Do not travel to areas where COVID-19 is a risk. The areas where COVID-19 is reported change often. To identify high-risk areas and travel restrictions, check the CDC travel website: wwwnc.cdc.gov/travel/notices  If you live in, or must travel to, an area where COVID-19 is a risk, take precautions to avoid infection. ? Stay away from people who are sick. ? Wash your hands often with soap and water for 20 seconds. If soap and   water are not available, use an alcohol-based hand sanitizer. ? Avoid touching your mouth, face, eyes, or nose. ? Avoid going out in public, follow guidance from your state and local health authorities. ? If you must go out in public, wear a cloth face covering or face mask. Make sure your mask covers your nose and mouth. ? Avoid crowded indoor spaces. Stay at least 6 feet (2 meters) away from others. ? Disinfect objects and surfaces that are frequently touched every day. This may include:  Counters and tables.  Doorknobs and light switches.  Sinks and faucets.  Electronics, such as phones, remote controls, keyboards, computers, and tablets. To protect others: If you have symptoms of COVID-19, take steps to prevent the virus from spreading to others.  If you think you have a COVID-19 infection, contact  your health care provider right away. Tell your health care team that you think you may have a COVID-19 infection.  Stay home. Leave your house only to seek medical care. Do not use public transport.  Do not travel while you are sick.  Wash your hands often with soap and water for 20 seconds. If soap and water are not available, use alcohol-based hand sanitizer.  Stay away from other members of your household. Let healthy household members care for children and pets, if possible. If you have to care for children or pets, wash your hands often and wear a mask. If possible, stay in your own room, separate from others. Use a different bathroom.  Make sure that all people in your household wash their hands well and often.  Cough or sneeze into a tissue or your sleeve or elbow. Do not cough or sneeze into your hand or into the air.  Wear a cloth face covering or face mask. Make sure your mask covers your nose and mouth. Where to find more information  Centers for Disease Control and Prevention: www.cdc.gov/coronavirus/2019-ncov/index.html  World Health Organization: www.who.int/health-topics/coronavirus Contact a health care provider if:  You live in or have traveled to an area where COVID-19 is a risk and you have symptoms of the infection.  You have had contact with someone who has COVID-19 and you have symptoms of the infection. Get help right away if:  You have trouble breathing.  You have pain or pressure in your chest.  You have confusion.  You have bluish lips and fingernails.  You have difficulty waking from sleep.  You have symptoms that get worse. These symptoms may represent a serious problem that is an emergency. Do not wait to see if the symptoms will go away. Get medical help right away. Call your local emergency services (911 in the [place] Do not drive yourself to the hospital. Let the emergency medical personnel know if you think you have  COVID-19. Summary  COVID-19 is a respiratory infection that is caused by a virus. It is also known as coronavirus disease or novel coronavirus. It can cause serious infections, such as pneumonia, acute respiratory distress syndrome, acute respiratory failure, or sepsis.  The virus that causes COVID-19 is contagious. This means that it can spread from person to person through droplets from breathing, speaking, singing, coughing, or sneezing.  You are more likely to develop a serious illness if you are 50 years of age or older, have a weak immune system, live in a nursing home, or have chronic disease.  There is no medicine to treat COVID-19. Your health care provider will talk with you about ways to treat your   symptoms.  Take steps to protect yourself and others from infection. Wash your hands often and disinfect objects and surfaces that are frequently touched every day. Stay away from people who are sick and wear a mask if you are sick. This information is not intended to replace advice given to you by your health care provider. Make sure you discuss any questions you have with your health care provider. Document Revised: 05/22/2019 Document Reviewed: 08/28/2018 Elsevier Patient Education  [place] on my medicine - XARELTO (rivaroxaban)  This medication education was reviewed with me or my healthcare representative as part of my discharge preparation.    WHY WAS XARELTO PRESCRIBED FOR YOU? Xarelto was prescribed to treat blood clots that may have been found in the veins of your legs (deep vein thrombosis) or in your lungs (pulmonary embolism) and to reduce the risk of them occurring again.  What do you need to know about Xarelto? The starting dose is one 15 mg tablet taken TWICE daily with food for the FIRST 21 DAYS then on  06/27/19  the dose is changed to one 20 mg tablet taken ONCE A DAY with your evening meal.  DO NOT stop taking Xarelto without talking  to the health care provider who prescribed the medication.  Refill your prescription for 20 mg tablets before you run out.  After discharge, you should have regular check-up appointments with your healthcare provider that is prescribing your Xarelto.  In the future your dose may need to be changed if your kidney function changes by a significant amount.  What do you do if you miss a dose? If you are taking Xarelto TWICE DAILY and you miss a dose, take it as soon as you remember. You may take two 15 mg tablets (total 30 mg) at the same time then resume your regularly scheduled 15 mg twice daily the next day.  If you are taking Xarelto ONCE DAILY and you miss a dose, take it as soon as you remember on the same day then continue your regularly scheduled once daily regimen the next day. Do not take two doses of Xarelto at the same time.   Important Safety Information Xarelto is a blood thinner medicine that can cause bleeding. You should call your healthcare provider right away if you experience any of the following: ? Bleeding from an injury or your nose that does not stop. ? Unusual colored urine (red or dark brown) or unusual colored stools (red or black). ? Unusual bruising for unknown reasons. ? A serious fall or if you hit your head (even if there is no bleeding).  Some medicines may interact with Xarelto and might increase your risk of bleeding while on Xarelto. To help avoid this, consult your healthcare provider or pharmacist prior to using any new prescription or non-prescription medications, including herbals, vitamins, non-steroidal anti-inflammatory drugs (NSAIDs) and supplements.  This website has more information on Xarelto: [email or web address]

## 2020-06-06 NOTE — Progress Notes (Signed)
Occupational Therapy Treatment Patient Details Name: Gabriella Robinson MRN: [id number] DOB: [birth year] Today's Date: 06/06/2020    History of present illness Patient presents with COVID, MS, HTN, HLD, DM, Hypersosmolar hyperglycemic state, hypokalemia, AKI, sepsis, fibromyalgia, opiod dependence. At baseline is ind with most ADLs. At this time is now on high flow nasal cannula 45 L/min with Fi02 80%. Patient was independent prior to admission living with family.   OT comments  Upon entering the room, pt working with PTA and ambulating with min guard while on 2 L O2 via [place]. Pt transitioned easily to OT intervention but was fatigued. Plan for pt to discharge home today and therefore therapist not wanting to fatigue pt further physically as she has to manage several flights of stairs. OT recommended pt have a stair on each stair landing to sit and rest in order to safely climb 3 flights to door. OT educated pt on energy conservation tasks for self care and IADL tasks with pt being an active participant in education. Pt's resting HR after ambulation 120 bpm. OT educated pt on wearing oxygen while in shower with ventilation for safety. Pt with no further questions at this time. All needs within reach.    Follow Up Recommendations  Home health OT;Supervision/Assistance - 24 hour    Equipment Recommendations  3 in 1 bedside commode       Precautions / Restrictions Precautions Precautions: Fall       Mobility Bed Mobility   Bed Mobility: Supine to Sit     Supine to sit: Supervision     General bed mobility comments: sittign EOB on her own upon arrival  Transfers Overall transfer level: Needs assistance Equipment used: Rolling walker (2 wheeled) Transfers: Sit to/from Stand Sit to Stand: Min guard           Balance Overall balance assessment: Needs assistance Sitting-balance support: Feet supported Sitting balance-Leahy Scale: Good     Standing balance support: Bilateral upper  extremity supported Standing balance-Leahy Scale: Good Standing balance comment: does well with walker but weakness increases fall risk.       ADL either performed or assessed with clinical judgement        Vision Baseline Vision/History: Wears glasses Wears Glasses: Reading only Patient Visual Report: No change from baseline            Cognition Arousal/Alertness: Awake/alert Behavior During Therapy: WFL for tasks assessed/performed Overall Cognitive Status: Within Functional Limits for tasks assessed      General Comments: A&Ox4, seemed to have slow response to most questions. She is anxious about mobility and fearful of falling.        Exercises Other Exercises Other Exercises: standing ex with walker x 20 heel raises, SLR and marches           Pertinent Vitals/ Pain       Pain Assessment: No/denies pain         Frequency  Min 1X/week        Progress Toward Goals  OT Goals(current goals can now be found in the care plan section)  Progress towards OT goals: Progressing toward goals  Acute Rehab OT Goals Patient Stated Goal: to go home OT Goal Formulation: With patient Time For Goal Achievement: 06/16/20  Plan Discharge plan remains appropriate       AM-PAC OT "6 Clicks" Daily Activity     Outcome Measure   Help from another person eating meals?: None Help from another person taking care of personal grooming?:  A Little Help from another person toileting, which includes using toliet, bedpan, or urinal?: A Little Help from another person bathing (including washing, rinsing, drying)?: A Little Help from another person to put on and taking off regular upper body clothing?: A Little Help from another person to put on and taking off regular lower body clothing?: A Little 6 Click Score: 19    End of Session Equipment Utilized During Treatment: Rolling walker  OT Visit Diagnosis: Unsteadiness on feet (R26.81);Muscle weakness (generalized) (M62.81)    Activity Tolerance Patient tolerated treatment well   Patient Left with family/visitor present   Nurse Communication Mobility status        Time: 5009-3818 OT Time Calculation (min): 30 min  Charges: OT General Charges $OT Visit: 1 Visit OT Treatments $Self Care/Home Management : 8-22 mins $Therapeutic Activity: 8-22 mins  Darleen Crocker, MS, OTR/L , CBIS ascom [phone]  06/06/20, 2:27 PM

## 2020-06-06 NOTE — Progress Notes (Signed)
Patient alert and oriented, vss, no complaints of pain.  D/c home with daughters.  Provided oxygen, wheelchair, BSC.    D/c telemetry and piv.  Escorted out of hospital via wheelchair by nursing.

## 2020-06-06 NOTE — Progress Notes (Signed)
Inpatient Diabetes Program Recommendations  AACE/ADA: New Consensus Statement on Inpatient Glycemic Control   Target Ranges:  Prepandial:   less than 140 mg/dL      Peak postprandial:   less than 180 mg/dL (1-2 hours)      Critically ill patients:  140 - 180 mg/dL   Results for Gabriella Robinson, Gabriella Robinson (MRN [id number]) as of 06/06/2020 14:03  Ref. Range 06/05/2020 08:01 06/05/2020 12:17 06/05/2020 16:57 06/05/2020 20:13 06/06/2020 08:06 06/06/2020 12:24  Glucose-Capillary Latest Ref Range: 70 - 99 mg/dL 193 (H) 296 (H) 335 (H) 380 (H) 174 (H) 295 (H)  Results for Gabriella Robinson, Gabriella Robinson (MRN [id number]) as of 06/06/2020 14:03  Ref. Range 05/25/2020 18:07  Hemoglobin A1C Latest Ref Range: 4.8 - 5.6 % 11.0 (H)   Review of Glycemic Control  Diabetes history: DM2 Outpatient Diabetes medications: Metformin 1000 mg BID, Glipizide 5 mg daily Current orders for Inpatient glycemic control: Lantus 32 units BID, Novolog 15 units TID with meals, Novolog 0-20 units TID with meals, Novolog 0-5 units QHS; Solumedrol 40 mg Q12H  NOTE: Noted patient will be discharging new to insulin. Had been attempting to call patient multiple times during hospitalization but unable to reach patient. Called patient's daughter Gabriella Robinson and spoke with her and her sister Gabriella Robinson over the phone. [name] states that she has DM and she uses an insulin pen. [name] states that she will be helping her mother at home and she plans to help patient with insulin injections until she can get use them herself.  Discussed impact of stress, sickness, and steroids on glucose control. Explained that once steroids are completed, glucose may improve and she may need adjustments with DM medications. Encouraged patient's daughter to be sure patient's glucose is checked as MD prescribes and to contact PCP if she is having consistently elevated glucose values or if she has any issues with hypoglycemia.  Discussed treatment of hypoglycemia. Patient's daughter verbalized  understanding of information discussed and would like for discharging nurse to call them to review discharge instructions. Spoke with patient over the phone regarding insulin, A1C, glucose, and DM control. Patient states she has never taken insulin but she has seen her daughter take insulin with an insulin pen. Patient is agreeable to using insulin at home.  Discussed A1C 11% on 05/25/20 indicating an average glucose of 269 mg/dl. Patient reports that her prior A1C was "good" per her PCP.  Discussed A1C and glucose goals. Discussed impact of stress, sickness, and steroids on glucose control and explained that once she is able to come off steroids completely glucose may improve and she may need adjustments with DM medications.  Reviewed hypoglycemia along with treatment. Discussed Lantus insulin and verbally reviewed steps of using an insulin pen. Also attached educational handouts on insulin pen injections to discharge educational material and ordered an insulin starter kit from pharmacy. Encouraged patient to check glucose as directed by MD and to contact PCP for follow up visit. Encouraged patient to contact PCP if she has any issues with hypoglycemia or if glucose continues to consistently run over 200 mg/dl.  Patient verbalized understanding of information discussed and she states she has no questions at this time.  Thanks Barnie Alderman, RN, MSN, CDE Diabetes Coordinator Inpatient Diabetes Program [phone] (Team Pager from 8am to 5pm)

## 2020-06-06 NOTE — Discharge Summary (Signed)
[facility]  Physician Discharge Summary   Patient ID: Gabriella Robinson MRN: [id number] DOB/AGE: 1960-12-13 59 y.o.  Admit date: 05/25/2020 Discharge date: 06/06/2020  PCP: Casilda Carls, MD  DISCHARGE DIAGNOSES:  Pneumonia due to COVID-19 Acute respiratory failure with hypoxia Acute pulmonary embolism Diabetes mellitus type 2, uncontrolled with hyperglycemia Essential hypertension History of multiple sclerosis Hyperlipidemia  RECOMMENDATIONS FOR OUTPATIENT FOLLOW UP: 1. Home health has been ordered 2. Patient to follow-up with PCP in 1 to 2 weeks 3. Patient started on Lantus.    Home Health: Patient declined skilled nursing facility for rehab.  Home health has been ordered Equipment/Devices: Walker.  Home oxygen  CODE STATUS: Full code  DISCHARGE CONDITION: fair  Diet recommendation: Modified carbohydrate  INITIAL HISTORY: Gabriella Robinson Sladeis a 59 y.o.femalewith medical history ofmultiple sclerosis on interferon-beta, hypertension, hyperlipidemia, type 2 diabetes mellitus, migraine headache, opioid dependence, fibromyalgia, neck pain, back pain, who presented to the ED on 05/25/20 with shortness breath, cough, fever and chills,nausea, vomiting, diarrhea x 5 days after close household contact with Covid-19.  Evaluation in the ED - positive Covid-19 PCR. Initial oxygen sat 84% on room air, improved to 90's on 4 L/min.  However, by morning of 10/21, patient requiring heated HFNC at 40 L/min.  Chest xray showed multifocal infiltrates.  Pt had tachycardia, tachypnea as well, meeting criteria for sepsis due to Covid-19 pneumonia.      HOSPITAL COURSE:   Acute Hypoxic Resp. Failure/Pneumonia due to COVID-19 Patient was hospitalized.  She was started on Remdesivir and steroids.  She declined baricitinib.  Inflammatory markers improved.  Her D-dimer was noted to be significantly elevated.  She underwent CT angiogram which showed acute pulmonary embolism as discussed  below.  She was initially requiring 4 L of oxygen by heated high flow.  She was also given furosemide with good response.  She is now down to 2 L of oxygen by nasal cannula.  She will likely need home oxygen.  Will be discharged on steroid taper.  Acute pulmonary embolism CT angiogram did show acute pulmonary embolism without any evidence for right heart strain.  Hemodynamically stable.  Echocardiogram showed normal systolic function the left and right ventricles.    She was initially treated with Lovenox.  Transitioned to rivaroxaban.  Lower extremity Doppler studies were negative for DVT.    Severe Sepsis due to Covid infection POA, criteria for sepsis including tachycardia, tachypnea, and known Covid-19 infection. Normal lactic acid.  Acute kidney injury reflects end organ dysfunction.  Patient treated in the ED with IV fluids and started on above therapies for Covid-19 infection.  Sepsis physiology resolved.  Acute kidney injury Present at admission.  Likely due to hypovolemia along with the use of ARB and HCTZ.  Resolved with IV hydration.   Diabetes mellitus type 2, uncontrolled with hyperglycemia/Hyperosmolar hyperglycemic state (HHS) POA Presented with glucose 753. Due to infection and possibly noncompliance with medication during illness.  Initially required insulin infusion.   She was subsequently transitioned to subcutaneous insulin.  HbA1c is 11.  Patient on Metformin and glipizide at home.   Considering extremely high HbA1c she will benefit from being on subcutaneous insulin at home.  She will be discharged with the Lantus pen.  Further management per PCP in the outpatient setting.  Hypokalemia Repleted  History of multiple sclerosis Patient on beta interferon injections 3 times a week.  Also noted to be on baclofen and tizanidine.  Essential hypertension Continue with losartan.    Hyperlipidemia Continue Lipitor.  Insomnia Continue home  trazodone  Obesity Estimated body mass index is 35.34 kg/m as calculated from the following:   Height as of this encounter: _0  (1.727 m).   Weight as of this encounter: 105.4 kg.  Overall patient is stable.  She declined skilled nursing facility for short-term rehab.  Wants to go home.  Home oxygen assessment to be done.  Otherwise she is medically stable for discharge.    PERTINENT LABS:  The results of significant diagnostics from this hospitalization (including imaging, microbiology, ancillary and laboratory) are listed below for reference.    Labs:  COVID-19 Labs   Lab Results  Component Value Date   SARSCOV2NAA POSITIVE (A) 05/25/2020      Basic Metabolic Panel: Recent Labs  Lab 06/02/20 0451 06/03/20 0615 06/04/20 0815 06/05/20 0508 06/06/20 0440  NA 138 137 136 135 136  K 4.1 4.4 4.9 4.2 4.4  CL 102 101 102 99 100  CO2 _1 GLUCOSE 256* 228* 322* 194* 178*  BUN 21* 22* 23* 28* 29*  CREATININE 0.63 0.57 0.62 0.61 0.74  CALCIUM 9.4 9.4 9.4 9.2 9.2  MG  --   --  2.3  --   --    Liver Function Tests: Recent Labs  Lab 05/31/20 0654 06/01/20 0550 06/02/20 0451  AST _2 ALT 32 33 31  ALKPHOS 69 62 70  BILITOT 0.5 0.6 0.5  PROT 6.5 6.1* 6.3*  ALBUMIN 2.8* 2.7* 2.8*   CBC: Recent Labs  Lab 05/31/20 0654 05/31/20 0654 06/01/20 0550 06/01/20 0550 06/02/20 0451 06/03/20 0615 06/04/20 0815 06/05/20 0508 06/06/20 0440  WBC 9.5   < > 12.0*   < > 12.4* 9.4 10.9* 11.9* 11.9*  NEUTROABS 7.5  --  8.3*  --  8.3*  --   --   --   --   HGB 11.8*   < > 11.3*   < > 11.7* 11.9* 12.2 11.6* 11.0*  HCT 37.0   < > 35.2*   < > 36.5 37.2 38.1 36.2 34.5*  MCV 88.3   < > 88.4   < > 88.8 89.2 88.8 89.8 90.3  PLT 562*   < > 574*   < > 575* 592* 620* 551* 542*   < > = values in this interval not displayed.   BNP: BNP (last 3 results) Recent Labs    05/25/20 1807 05/30/20 0734  BNP 45.8 28.2    CBG: Recent Labs  Lab 06/05/20 1217  06/05/20 1657 06/05/20 2013 06/06/20 0806 06/06/20 1224  GLUCAP 296* 335* 380* 174* 295*     IMAGING STUDIES CT ANGIO CHEST PE W OR WO CONTRAST  Result Date: 06/01/2020 CLINICAL DATA:  COVID-19 positive.  Shortness of breath. EXAM: CT ANGIOGRAPHY CHEST WITH CONTRAST TECHNIQUE: Multidetector CT imaging of the chest was performed using the standard protocol during bolus administration of intravenous contrast. Multiplanar CT image reconstructions and MIPs were obtained to evaluate the vascular anatomy. CONTRAST:  152m OMNIPAQUE IOHEXOL 350 MG/ML SOLN COMPARISON:  January 09, 2017 FINDINGS: Cardiovascular: Contrast injection is sufficient to demonstrate satisfactory opacification of the pulmonary arteries to the segmental level. Findings are suspicious for subsegmental pulmonary emboli involving the left lower lobe (axial series 6, image 155). There are subsegmental pulmonary emboli involving the right upper lobe (axial series 6, image 76). There is no CT evidence for heart strain. The size of the main pulmonary artery is enlarged, measuring 3.5 cm. Cardiomegaly with coronary artery calcification.  There are atherosclerotic changes of the thoracic aorta without evidence for an aneurysm or dissection. Mediastinum/Nodes: -- No mediastinal lymphadenopathy. -- No hilar lymphadenopathy. -- No axillary lymphadenopathy. -- No supraclavicular lymphadenopathy. -- Normal thyroid gland where visualized. -  Unremarkable esophagus. Lungs/Pleura: Diffuse bilateral ground-glass airspace opacities are noted. There is atelectasis at the lung bases. There is no pneumothorax. There is no large pleural effusion. Upper Abdomen: Contrast bolus timing is not optimized for evaluation of the abdominal organs. The visualized portions of the organs of the upper abdomen are normal. Musculoskeletal: No chest wall abnormality. No bony spinal canal stenosis. Review of the MIP images confirms the above findings. IMPRESSION: 1. There are  subsegmental pulmonary emboli involving the right upper lobe and left lower lobe. No CT evidence for heart strain. 2. Diffuse bilateral ground-glass airspace opacities consistent with the patient's history of viral pneumonia. 3. Cardiomegaly with coronary artery disease. 4. Enlarged main pulmonary artery, which can be seen in patients with elevated pulmonary artery pressures. Aortic Atherosclerosis (ICD10-I70.0). These results were called by telephone at the time of interpretation on 06/01/2020 at 4:03 pm to provider [facility] , who verbally acknowledged these results. Electronically Signed   By: Constance Holster M.D.   On: 06/01/2020 16:01   US Venous Img Lower Bilateral (DVT)  Result Date: 06/01/2020 CLINICAL DATA:  PE EXAM: Bilateral LOWER EXTREMITY VENOUS DOPPLER ULTRASOUND TECHNIQUE: Gray-scale sonography with compression, as well as color and duplex ultrasound, were performed to evaluate the deep venous system(s) from the level of the common femoral vein through the popliteal and proximal calf veins. COMPARISON:  None. FINDINGS: VENOUS Normal compressibility of the common femoral, superficial femoral, and popliteal veins, as well as the visualized calf veins. Visualized portions of profunda femoral vein and great saphenous vein unremarkable. No filling defects to suggest DVT on grayscale or color Doppler imaging. Doppler waveforms show normal direction of venous flow, normal respiratory plasticity and response to augmentation. OTHER None. Limitations: none IMPRESSION: Negative. Electronically Signed   By: Constance Holster M.D.   On: 06/01/2020 17:35   DG Chest Portable 1 View  Result Date: 05/25/2020 CLINICAL DATA:  Dyspnea, COVID exposure EXAM: PORTABLE CHEST 1 VIEW COMPARISON:  01/09/2017 FINDINGS: Bibasilar asymmetric airspace infiltrates are present, likely infectious or inflammatory in the acute setting. No pneumothorax or pleural effusion. Cardiac size within normal limits. Pulmonary  vascularity normal. No acute bone abnormality. IMPRESSION: Moderate bilateral pulmonary infiltrates, likely infectious or inflammatory. Electronically Signed   By: Fidela Salisbury MD   On: 05/25/2020 14:15   ECHOCARDIOGRAM COMPLETE  Result Date: 06/03/2020    ECHOCARDIOGRAM REPORT   Patient Name:   TYNISHA OGAN Date of Exam: 06/03/2020 Medical Rec #:  [id number]     Height:       68.0 in Accession #:    [id number]    Weight:       232.4 lb Date of Birth:  [birth year]    BSA:          2.178 m Patient Age:    10 years      BP:           113/63 mmHg Patient Gender: F             HR:           79 bpm. Exam Location:  [facility] Procedure: 2D Echo, Cardiac Doppler and Color Doppler Indications:     Pulmonary Embolus 415.19  History:         Patient  has no prior history of Echocardiogram examinations.                  Risk Factors:Diabetes and Sleep Apnea. HBP.  Sonographer:     Sherrie Sport RDCS (AE) Referring Phys:  [id number] Bonnielee Haff Diagnosing Phys: Yolonda Kida MD  Sonographer Comments: No parasternal window. IMPRESSIONS  1. TDS.  2. Left ventricular ejection fraction, by estimation, is 50 to 55%. The left ventricle has low normal function. The left ventricle has no regional wall motion abnormalities. Left ventricular diastolic parameters are consistent with Grade I diastolic dysfunction (impaired relaxation).  3. Right ventricular systolic function is normal. The right ventricular size is normal.  4. Mild thickening of the both mitral valve leaflet(s).  5. The mitral valve is normal in structure. No evidence of mitral valve regurgitation.  6. The aortic valve is normal in structure. Aortic valve regurgitation is not visualized. FINDINGS  Left Ventricle: Left ventricular ejection fraction, by estimation, is 50 to 55%. The left ventricle has low normal function. The left ventricle has no regional wall motion abnormalities. The left ventricular internal cavity size was normal in size. There is no left ventricular  hypertrophy. Left ventricular diastolic parameters are consistent with Grade I diastolic dysfunction (impaired relaxation). Right Ventricle: The right ventricular size is normal. No increase in right ventricular wall thickness. Right ventricular systolic function is normal. Left Atrium: Left atrial size was normal in size. Right Atrium: Right atrial size was normal in size. Pericardium: The pericardium was not well visualized. Mitral Valve: The mitral valve is normal in structure. There is mild thickening of the both mitral valve leaflet(s). There is mild calcification of the mitral valve leaflet(s). Mild to moderate mitral annular calcification. No evidence of mitral valve regurgitation. Tricuspid Valve: The tricuspid valve is normal in structure. Tricuspid valve regurgitation is trivial. Aortic Valve: The aortic valve is normal in structure. Aortic valve regurgitation is not visualized. Aortic valve mean gradient measures 2.0 mmHg. Aortic valve peak gradient measures 3.8 mmHg. Aortic valve area, by VTI measures 3.02 cm. Pulmonic Valve: The pulmonic valve was normal in structure. Pulmonic valve regurgitation is not visualized. Aorta: The aortic arch was not well visualized. IAS/Shunts: No atrial level shunt detected by color flow Doppler. Additional Comments: TDS.  LEFT VENTRICLE PLAX 2D LVIDd:         2.98 cm  Diastology LVIDs:         2.38 cm  LV e' medial:    4.03 cm/s LV PW:         1.15 cm  LV E/e' medial:  15.8 LV IVS:        0.99 cm  LV e' lateral:   10.10 cm/s LVOT diam:     2.00 cm  LV E/e' lateral: 6.3 LV SV:         52 LV SV Index:   24 LVOT Area:     3.14 cm  LEFT ATRIUM             Index       RIGHT ATRIUM          Index LA diam:        3.50 cm 1.61 cm/m  RA Area:     8.94 cm LA Vol (A2C):   25.2 ml 11.57 ml/m RA Volume:   16.00 ml 7.34 ml/m LA Vol (A4C):   29.4 ml 13.50 ml/m LA Biplane Vol: 28.1 ml 12.90 ml/m  AORTIC VALVE AV Area (Vmax):  2.35 cm AV Area (Vmean):   2.54 cm AV Area (VTI):      3.02 cm AV Vmax:           96.85 cm/s AV Vmean:          68.750 cm/s AV VTI:            0.170 m AV Peak Grad:      3.8 mmHg AV Mean Grad:      2.0 mmHg LVOT Vmax:         72.50 cm/s LVOT Vmean:        55.500 cm/s LVOT VTI:          0.164 m LVOT/AV VTI ratio: 0.96 MITRAL VALVE MV Area (PHT): 2.83 cm    SHUNTS MV Decel Time: 268 msec    Systemic VTI:  0.16 m MV E velocity: 63.80 cm/s  Systemic Diam: 2.00 cm MV A velocity: 74.60 cm/s MV E/A ratio:  0.86 Dwayne D Callwood MD Electronically signed by Yolonda Kida MD Signature Date/Time: 06/03/2020/9:57:43 AM    Final     DISCHARGE EXAMINATION: Vitals:   06/05/20 2016 06/06/20 0315 06/06/20 0809 06/06/20 1226  BP: 104/60 113/73 115/74 109/65  Pulse: 97 100 93 (!) 101  Resp:   17 17  Temp: 98.4 F (36.9 C) 98.5 F (36.9 C) 97.9 F (36.6 C) 98.8 F (37.1 C)  TempSrc: Oral Oral Oral Oral  SpO2: 95% 94% 93% 94%  Weight:      Height:       General appearance: Awake alert.  In no distress Resp: Clear to auscultation bilaterally.  Normal effort Cardio: S1-S2 is normal regular.  No S3-S4.  No rubs murmurs or bruit GI: Abdomen is soft.  Nontender nondistended.  Bowel sounds are present normal.  No masses organomegaly    DISPOSITION: Home with home health  Discharge Instructions    Call MD for:  difficulty breathing, headache or visual disturbances   Complete by: As directed    Call MD for:  extreme fatigue   Complete by: As directed    Call MD for:  persistant dizziness or light-headedness   Complete by: As directed    Call MD for:  persistant nausea and vomiting   Complete by: As directed    Call MD for:  severe uncontrolled pain   Complete by: As directed    Call MD for:  temperature >100.4   Complete by: As directed    Diet Carb Modified   Complete by: As directed    Discharge instructions   Complete by: As directed    Please take your medications as prescribed.  Home health will be ordered.  Please follow-up with your  primary care provider in 1 to 2 weeks.  Use your oxygen on a continuous basis.  Your isolation well end on November 9.  COVID 19 INSTRUCTIONS  - You are felt to be stable enough to no longer require inpatient monitoring, testing, and treatment, though you will need to follow the recommendations below: - Based on the CDC's non-test criteria for ending self-isolation: You may not return to work/leave the home until at least 20 days since symptom onset AND 24 hours without a fever (without taking tylenol, ibuprofen, etc.) AND have improvement in respiratory symptoms. - Do not take NSAID medications (including, but not limited to, ibuprofen, advil, motrin, naproxen, aleve, goody's powder, etc.) - Follow up with your doctor in the next week via telehealth or seek medical attention right away  if your symptoms get WORSE.    Directions for you at home:  Wear a facemask You should wear a facemask that covers your nose and mouth when you are in the same room with other people and when you visit a healthcare provider. People who live with or visit you should also wear a facemask while they are in the same room with you.  Separate yourself from other people in your home As much as possible, you should stay in a different room from other people in your home. Also, you should use a separate bathroom, if available.  Avoid sharing household items You should not share dishes, drinking glasses, cups, eating utensils, towels, bedding, or other items with other people in your home. After using these items, you should wash them thoroughly with soap and water.  Cover your coughs and sneezes Cover your mouth and nose with a tissue when you cough or sneeze, or you can cough or sneeze into your sleeve. Throw used tissues in a lined trash can, and immediately wash your hands with soap and water for at least 20 seconds or use an alcohol-based hand rub.  Wash your [facility] your hands often and thoroughly with  soap and water for at least 20 seconds. You can use an alcohol-based hand sanitizer if soap and water are not available and if your hands are not visibly dirty. Avoid touching your eyes, nose, and mouth with unwashed hands.  Directions for those who live with, or provide care at home for you:  Limit the number of people who have contact with the patient If possible, have only one caregiver for the patient. Other household members should stay in another home or place of residence. If this is not possible, they should stay in another room, or be separated from the patient as much as possible. Use a separate bathroom, if available. Restrict visitors who do not have an essential need to be in the home.  Ensure good ventilation Make sure that shared spaces in the home have good air flow, such as from an air conditioner or an opened window, weather permitting.  Wash your hands often Wash your hands often and thoroughly with soap and water for at least 20 seconds. You can use an alcohol based hand sanitizer if soap and water are not available and if your hands are not visibly dirty. Avoid touching your eyes, nose, and mouth with unwashed hands. Use disposable paper towels to dry your hands. If not available, use dedicated cloth towels and replace them when they become wet.  Wear a facemask and gloves Wear a disposable facemask at all times in the room and gloves when you touch or have contact with the patient's blood, body fluids, and/or secretions or excretions, such as sweat, saliva, sputum, nasal mucus, vomit, urine, or feces.  Ensure the mask fits over your nose and mouth tightly, and do not touch it during use. Throw out disposable facemasks and gloves after using them. Do not reuse. Wash your hands immediately after removing your facemask and gloves. If your personal clothing becomes contaminated, carefully remove clothing and launder. Wash your hands after handling contaminated  clothing. Place all used disposable facemasks, gloves, and other waste in a lined container before disposing them with other household waste. Remove gloves and wash your hands immediately after handling these items.  Do not share dishes, glasses, or other household items with the patient Avoid sharing household items. You should not share dishes, drinking glasses, cups, eating  utensils, towels, bedding, or other items with a patient who is confirmed to have, or being evaluated for, COVID-19 infection. After the person uses these items, you should wash them thoroughly with soap and water.  Wash laundry thoroughly Immediately remove and wash clothes or bedding that have blood, body fluids, and/or secretions or excretions, such as sweat, saliva, sputum, nasal mucus, vomit, urine, or feces, on them. Wear gloves when handling laundry from the patient. Read and follow directions on labels of laundry or clothing items and detergent. In general, wash and dry with the warmest temperatures recommended on the label.  Clean all areas the individual has used often Clean all touchable surfaces, such as counters, tabletops, doorknobs, bathroom fixtures, toilets, phones, keyboards, tablets, and bedside tables, every day. Also, clean any surfaces that may have blood, body fluids, and/or secretions or excretions on them. Wear gloves when cleaning surfaces the patient has come in contact with. Use a diluted bleach solution (e.g., dilute bleach with 1 part bleach and 10 parts water) or a household disinfectant with a label that says EPA-registered for coronaviruses. To make a bleach solution at home, add 1 tablespoon of bleach to 1 quart (4 cups) of water. For a larger supply, add  cup of bleach to 1 gallon (16 cups) of water. Read labels of cleaning products and follow recommendations provided on product labels. Labels contain instructions for safe and effective use of the cleaning product including precautions you  should take when applying the product, such as wearing gloves or eye protection and making sure you have good ventilation during use of the product. Remove gloves and wash hands immediately after cleaning.  Monitor yourself for signs and symptoms of illness Caregivers and household members are considered close contacts, should monitor their health, and will be asked to limit movement outside of the home to the extent possible. Follow the monitoring steps for close contacts listed on the symptom monitoring form.   If you have additional questions, contact your local health department or call the epidemiologist on call at [phone] (available 24/7). This guidance is subject to change. For the most up-to-date guidance from [facility], please refer to their website: YouBlogs.pl     You were cared for by a hospitalist during your hospital stay. If you have any questions about your discharge medications or the care you received while you were in the hospital after you are discharged, you can call the unit and asked to speak with the hospitalist on call if the hospitalist that took care of you is not available. Once you are discharged, your primary care physician will handle any further medical issues. Please note that NO REFILLS for any discharge medications will be authorized once you are discharged, as it is imperative that you return to your primary care physician (or establish a relationship with a primary care physician if you do not have one) for your aftercare needs so that they can reassess your need for medications and monitor your lab values. If you do not have a primary care physician, you can call [phone] for a physician referral.   Increase activity slowly   Complete by: As directed         Allergies as of 06/06/2020   No Known Allergies     Medication List    STOP taking these medications   aspirin 81 MG EC tablet    esomeprazole 40 MG capsule Commonly known as: NexIUM   ibuprofen 600 MG tablet Commonly known as: ADVIL  phenazopyridine 200 MG tablet Commonly known as: PYRIDIUM     TAKE these medications   acetaminophen 500 MG tablet Commonly known as: TYLENOL Take 1,000 mg by mouth every 4 (four) hours as needed for pain.   albuterol 108 (90 Base) MCG/ACT inhaler Commonly known as: VENTOLIN HFA Inhale 2 puffs into the lungs every 4 (four) hours as needed for wheezing or shortness of breath.   atorvastatin 10 MG tablet Commonly known as: LIPITOR Take 10 mg by mouth daily.   Baclofen 5 MG Tabs Take 5 mg by mouth at bedtime.   blood glucose meter kit and supplies Kit Dispense based on patient and insurance preference. Use up to four times daily as directed. (FOR ICD-9 250.00, 250.01).   Emgality 120 MG/ML Sosy Generic drug: Galcanezumab-gnlm Inject 120 mg into the skin every 28 (twenty-eight) days.   famotidine 20 MG tablet Commonly known as: PEPCID Take 1 tablet (20 mg total) by mouth daily. Start taking on: June 07, 2020   gabapentin 800 MG tablet Commonly known as: NEURONTIN Take 800 mg by mouth 2 (two) times daily.   glipiZIDE 5 MG 24 hr tablet Commonly known as: GLUCOTROL XL Take 5 mg by mouth daily with breakfast.   guaiFENesin-dextromethorphan 100-10 MG/5ML syrup Commonly known as: ROBITUSSIN DM Take 5 mLs by mouth every 4 (four) hours as needed for cough.   hydrochlorothiazide 25 MG tablet Commonly known as: HYDRODIURIL Take 25 mg by mouth daily.   insulin glargine 100 UNIT/ML Solostar Pen Commonly known as: LANTUS Inject 10 Units into the skin daily.   Insulin Pen Needle 30G X 8 MM Misc Commonly known as: NOVOFINE Inject 10 each into the skin as needed.   losartan 50 MG tablet Commonly known as: COZAAR Take 50 mg by mouth daily.   meloxicam 7.5 MG tablet Commonly known as: MOBIC Take 7.5 mg by mouth 2 (two) times daily.   metFORMIN 1000 MG  tablet Commonly known as: GLUCOPHAGE Take 1,000 mg by mouth 2 (two) times daily.   mometasone-formoterol 100-5 MCG/ACT Aero Commonly known as: DULERA Inhale 2 puffs into the lungs 2 (two) times daily.   ondansetron 4 MG tablet Commonly known as: ZOFRAN Take 4 mg by mouth every 8 (eight) hours as needed.   polyethylene glycol 17 g packet Commonly known as: MIRALAX / GLYCOLAX Take 17 g by mouth daily. Start taking on: June 07, 2020   predniSONE 20 MG tablet Commonly known as: DELTASONE Take 3 tablets once daily for 3 days followed by 2 tablets once daily for 3 days followed by 1 tablet once daily for 3 days and then stop   Rebif 22 MCG/0.5ML injection Generic drug: interferon beta-1a Inject 22 mcg into the skin 3 (three) times a week. Monday Wednesday and Friday   Rivaroxaban Stater Pack (15 mg and 20 mg) Commonly known as: XARELTO STARTER PACK Follow package directions: Take one $Remove'15mg'JLKovhI$  tablet by mouth twice a day. On day 22, switch to one $Remo'20mg'cVzfU$  tablet once a day. Take with food.   rivaroxaban 20 MG Tabs tablet Commonly known as: XARELTO Take 1 tablet (20 mg total) by mouth daily with supper. PLEASE START ONLY AFTER YOU HAVE COMPLETED THE STARTER PACK Start taking on: July 03, 2020   SUMAtriptan 100 MG tablet Commonly known as: IMITREX Take 100 mg by mouth every 2 (two) hours as needed.   tiZANidine 4 MG tablet Commonly known as: ZANAFLEX Take 4 mg by mouth 3 (three) times daily.   Topamax 100  MG tablet Generic drug: topiramate Take 100 mg by mouth 2 (two) times daily.            Durable Medical Equipment  (From admission, onward)         Start     Ordered   06/06/20 0927  For home use only DME Walker rolling  Once       Question Answer Comment  Walker: With [place] Wheels   Patient needs a walker to treat with the following condition Physical deconditioning      06/06/20 0926   06/06/20 0927  For home use only DME 3 n 1  Once        06/06/20 0926    06/05/20 1325  For home use only DME oxygen  Once       Question Answer Comment  Length of Need 6 Months   Mode or (Route) Nasal cannula   Liters per Minute 2   Frequency Continuous (stationary and portable oxygen unit needed)   Oxygen conserving device Yes   Oxygen delivery system Gas      06/05/20 1324            Follow-up Information    Casilda Carls, MD. Schedule an appointment as soon as possible for a visit on 06/13/2020.   Specialty: Internal Medicine Why: @ 10:30am Contact information: [address] [place] [id number] [phone]               TOTAL DISCHARGE TIME: 69 minutes  [place]  [facility] Pager on [email or web address]  06/06/2020, 12:55 PM

## 2020-08-10 ENCOUNTER — Ambulatory Visit: Payer: Medicare Other | Admitting: Dermatology

## 2020-09-19 ENCOUNTER — Other Ambulatory Visit: Payer: Self-pay

## 2020-09-19 ENCOUNTER — Ambulatory Visit (INDEPENDENT_AMBULATORY_CARE_PROVIDER_SITE_OTHER): Payer: Medicare Other | Admitting: Dermatology

## 2020-09-19 DIAGNOSIS — R61 Generalized hyperhidrosis: Secondary | ICD-10-CM | POA: Diagnosis not present

## 2020-09-19 MED ORDER — GLYCOPYRROLATE 1 MG PO TABS: 1.0000 mg | ORAL_TABLET | Freq: Two times a day (BID) | ORAL | 2 refills | Status: DC

## 2020-09-19 NOTE — Progress Notes (Signed)
   Follow-Up Visit   Subjective  Gabriella Robinson is a 60 y.o. female who presents for the following: Excessive Sweating (Face, hands, feet, 2 yrs, no treatment, gets dripping sweat with and without exertion/).  Very bothersome and embarrassing to patient.   The following portions of the chart were reviewed this encounter and updated as appropriate:       Review of Systems:  No other skin or systemic complaints except as noted in HPI or Assessment and Plan.  Objective  Well appearing patient in no apparent distress; mood and affect are within normal limits.  A focused examination was performed including face, hands. Relevant physical exam findings are noted in the Assessment and Plan.  Objective  face, palms, feet: Moisture palms   Assessment & Plan  Hyperhidrosis face, palms, feet  Chronic condition, disruptive to quality of life, no cure Discussed Botox to forehead (noncovered), discussed PO Robinul to help palms/feet  Start Robinul 1mg  bid prn 60 2 rf Discussed not taking Robinul when exercising or when outdoors in heat   Glycopyrrolate can significantly increase the risk of heat stroke so you should avoid using it in the heat, particularly while active. It can also cause dry mouth, blurred vision, difficulty with urination, headache, constipation, and racing heart. Take it only as directed. Never take more than 8 tablets total per day.    glycopyrrolate (ROBINUL) 1 MG tablet - face, palms, feet  Return in about 3 months (around 12/17/2020) for hyperhidrosis, possible Botox forehead.  I, Othelia Pulling, RMA, am acting as scribe for Brendolyn Patty, MD . Documentation: I have reviewed the above documentation for accuracy and completeness, and I agree with the above.  Brendolyn Patty MD

## 2020-11-07 ENCOUNTER — Other Ambulatory Visit: Payer: Medicare Other | Attending: Gastroenterology

## 2020-11-18 ENCOUNTER — Encounter: Payer: Self-pay | Admitting: *Deleted

## 2020-11-21 ENCOUNTER — Ambulatory Visit: Payer: Medicare Other | Admitting: Anesthesiology

## 2020-11-21 ENCOUNTER — Encounter
Admission: RE | Disposition: A | Discharge status: Home / Self Care | Payer: Self-pay | Source: Home / Self Care | Attending: Gastroenterology

## 2020-11-21 ENCOUNTER — Other Ambulatory Visit: Payer: Self-pay

## 2020-11-21 ENCOUNTER — Ambulatory Visit
Admission: RE | Admit: 2020-11-21 | Discharge: 2020-11-21 | Disposition: A | Discharge status: Home / Self Care | Payer: Medicare Other | Attending: Gastroenterology | Admitting: Gastroenterology

## 2020-11-21 ENCOUNTER — Encounter: Payer: Self-pay | Admitting: *Deleted

## 2020-11-21 DIAGNOSIS — Z7984 Long term (current) use of oral hypoglycemic drugs: Secondary | ICD-10-CM | POA: Diagnosis not present

## 2020-11-21 DIAGNOSIS — D122 Benign neoplasm of ascending colon: Secondary | ICD-10-CM | POA: Diagnosis not present

## 2020-11-21 DIAGNOSIS — Z791 Long term (current) use of non-steroidal anti-inflammatories (NSAID): Secondary | ICD-10-CM | POA: Diagnosis not present

## 2020-11-21 DIAGNOSIS — Z794 Long term (current) use of insulin: Secondary | ICD-10-CM | POA: Insufficient documentation

## 2020-11-21 DIAGNOSIS — Z79899 Other long term (current) drug therapy: Secondary | ICD-10-CM | POA: Diagnosis not present

## 2020-11-21 DIAGNOSIS — Z1211 Encounter for screening for malignant neoplasm of colon: Secondary | ICD-10-CM | POA: Insufficient documentation

## 2020-11-21 DIAGNOSIS — E119 Type 2 diabetes mellitus without complications: Secondary | ICD-10-CM | POA: Insufficient documentation

## 2020-11-21 HISTORY — PX: COLONOSCOPY WITH PROPOFOL: SHX5780

## 2020-11-21 LAB — GLUCOSE, CAPILLARY: Glucose-Capillary: 160 mg/dL — ABNORMAL HIGH (ref 70–99)

## 2020-11-21 SURGERY — COLONOSCOPY WITH PROPOFOL
Anesthesia: General

## 2020-11-21 MED ORDER — PROPOFOL 500 MG/50ML IV EMUL
INTRAVENOUS | Status: DC | PRN
  Administered 2020-11-21: 120 ug/kg/min via INTRAVENOUS

## 2020-11-21 MED ORDER — PROPOFOL 500 MG/50ML IV EMUL
INTRAVENOUS | Status: AC
  Filled 2020-11-21: qty 50

## 2020-11-21 MED ORDER — SODIUM CHLORIDE 0.9 % IV SOLN: INTRAVENOUS | Status: DC

## 2020-11-21 NOTE — Anesthesia Postprocedure Evaluation (Signed)
Anesthesia Post Note  Patient: KATARINA RIEBE  Procedure(s) Performed: COLONOSCOPY WITH PROPOFOL (N/A )  Patient location during evaluation: Endoscopy Anesthesia Type: General Level of consciousness: awake and alert Pain management: pain level controlled Vital Signs Assessment: post-procedure vital signs reviewed and stable Respiratory status: spontaneous breathing, nonlabored ventilation, respiratory function stable and patient connected to nasal cannula oxygen Cardiovascular status: blood pressure returned to baseline and stable Postop Assessment: no apparent nausea or vomiting Anesthetic complications: no   No complications documented.   Last Vitals:  Vitals:   11/21/20 0832 11/21/20 0842  BP: (!) 148/87 (!) 156/91  Pulse:    Resp:    Temp:    SpO2:      Last Pain:  Vitals:   11/21/20 0832  TempSrc:   PainSc: 0-No pain                 Martha Clan

## 2020-11-21 NOTE — H&P (Signed)
Outpatient short stay form Pre-procedure 11/21/2020 7:37 AM Gabriella Miyamoto MD, MPH  Primary Physician: Dr. Rosario Jacks  Reason for visit:  Screening colonoscopy  History of present illness:   60 y/o lady with history of DM II here for screening colonoscopy. No family history of GI malignancies. No blood thinners. History of hysterectomy.    Current Facility-Administered Medications:  .  0.9 %  sodium chloride infusion, , Intravenous, Continuous, Aarav Burgett, Hilton Cork, MD  Medications Prior to Admission  Medication Sig Dispense Refill Last Dose  . acetaminophen (TYLENOL) 500 MG tablet Take 1,000 mg by mouth every 4 (four) hours as needed for pain.   Past Week at Unknown time  . albuterol (VENTOLIN HFA) 108 (90 Base) MCG/ACT inhaler Inhale 2 puffs into the lungs every 4 (four) hours as needed for wheezing or shortness of breath. 1 each 0 Past Week at Unknown time  . atorvastatin (LIPITOR) 10 MG tablet Take 10 mg by mouth daily.   11/20/2020 at 0800  . Baclofen 5 MG TABS Take 5 mg by mouth at bedtime.    11/20/2020 at 0800  . blood glucose meter kit and supplies KIT Dispense based on patient and insurance preference. Use up to four times daily as directed. (FOR ICD-9 250.00, 250.01). 1 each 0 11/20/2020 at Unknown time  . famotidine (PEPCID) 20 MG tablet Take 1 tablet (20 mg total) by mouth daily. 14 tablet 0 Past Week at Unknown time  . gabapentin (NEURONTIN) 800 MG tablet Take 800 mg by mouth 2 (two) times daily.   11/20/2020 at 0800  . glipiZIDE (GLUCOTROL XL) 5 MG 24 hr tablet Take 5 mg by mouth daily with breakfast.   3 11/20/2020 at 0800  . glycopyrrolate (ROBINUL) 1 MG tablet Take 1 tablet (1 mg total) by mouth 2 (two) times daily. 60 tablet 2 Past Week at Unknown time  . guaiFENesin-dextromethorphan (ROBITUSSIN DM) 100-10 MG/5ML syrup Take 5 mLs by mouth every 4 (four) hours as needed for cough. 118 mL 0 Past Week at Unknown time  . hydrochlorothiazide (HYDRODIURIL) 25 MG tablet Take 25 mg by  mouth daily.   11/20/2020 at 0800  . insulin glargine (LANTUS) 100 UNIT/ML Solostar Pen Inject 10 Units into the skin daily. 15 mL 1 Past Week at Unknown time  . Insulin Pen Needle (NOVOFINE) 30G X 8 MM MISC Inject 10 each into the skin as needed. 1 packet 1 Past Week at Unknown time  . interferon beta-1a (REBIF) 22 MCG/0.5ML injection Inject 22 mcg into the skin 3 (three) times a week. Monday Wednesday and Friday   Past Week at Unknown time  . losartan (COZAAR) 50 MG tablet Take 50 mg by mouth daily.    11/20/2020 at 0800  . meloxicam (MOBIC) 7.5 MG tablet Take 7.5 mg by mouth 2 (two) times daily.   Past Week at Unknown time  . metFORMIN (GLUCOPHAGE) 1000 MG tablet Take 1,000 mg by mouth 2 (two) times daily.   11/20/2020 at 0800  . mometasone-formoterol (DULERA) 100-5 MCG/ACT AERO Inhale 2 puffs into the lungs 2 (two) times daily. 1 each 0 Past Week at Unknown time  . ondansetron (ZOFRAN) 4 MG tablet Take 4 mg by mouth every 8 (eight) hours as needed.   Past Week at Unknown time  . SUMAtriptan (IMITREX) 100 MG tablet Take 100 mg by mouth every 2 (two) hours as needed.    Past Week at Unknown time  . tiZANidine (ZANAFLEX) 4 MG tablet Take 4 mg by mouth  3 (three) times daily.   Past Week at Unknown time  . TOPAMAX 100 MG tablet Take 100 mg by mouth 2 (two) times daily.   Past Week at Unknown time  . ciprofloxacin (CIPRO) 500 MG tablet Take 500 mg by mouth 2 (two) times daily. (Patient not taking: Reported on 11/21/2020)   Completed Course at Unknown time  . EMGALITY 120 MG/ML SOSY Inject 120 mg into the skin every 28 (twenty-eight) days.     Marland Kitchen HYDROcodone-acetaminophen (NORCO/VICODIN) 5-325 MG tablet Take 1 tablet by mouth every 6 (six) hours as needed for moderate pain. (Patient not taking: Reported on 11/21/2020)   Completed Course at Unknown time  . polyethylene glycol (MIRALAX / GLYCOLAX) 17 g packet Take 17 g by mouth daily. (Patient not taking: Reported on 11/21/2020) 14 each 0 Completed Course at  Unknown time  . predniSONE (DELTASONE) 20 MG tablet Take 3 tablets once daily for 3 days followed by 2 tablets once daily for 3 days followed by 1 tablet once daily for 3 days and then stop (Patient not taking: Reported on 11/21/2020) 18 tablet 0 Completed Course at Unknown time  . rivaroxaban (XARELTO) 20 MG TABS tablet Take 1 tablet (20 mg total) by mouth daily with supper. PLEASE START ONLY AFTER YOU HAVE COMPLETED THE STARTER PACK (Patient not taking: Reported on 11/21/2020) 30 tablet 2 Completed Course at Unknown time  . RIVAROXABAN (XARELTO) VTE STARTER PACK (15 & 20 MG TABLETS) Follow package directions: Take one 21m tablet by mouth twice a day. On day 22, switch to one 273mtablet once a day. Take with food. (Patient not taking: Reported on 11/21/2020) 51 each 0 Completed Course at Unknown time     No Known Allergies   Past Medical History:  Diagnosis Date  . Chronic back pain   . [place]  . HBP (high blood pressure)   . History of blood clots   . Lumbar canal stenosis 11/12/2013  . Migraines   . MS (multiple sclerosis) [place]  . Sinus complaint   . Sleep apnea     Review of systems:  Otherwise negative.    Physical Exam  Gen: Alert, oriented. Appears stated age.  HEENT: PERRLA. Lungs: No respiratory distress CV: RRR Abd: soft, benign, no masses Ext: No edema    Planned procedures: Proceed with colonoscopy. The patient understands the nature of the planned procedure, indications, risks, alternatives and potential complications including but not limited to bleeding, infection, perforation, damage to internal organs and possible oversedation/side effects from anesthesia. The patient agrees and gives consent to proceed.  Please refer to procedure notes for findings, recommendations and patient disposition/instructions.     TrRaylene MiyamotoD, MPH Gastroenterology 11/21/2020  7:37 AM

## 2020-11-21 NOTE — Anesthesia Procedure Notes (Signed)
Performed by: Vaughan Sine Pre-anesthesia Checklist: Patient identified, Emergency Drugs available, Suction available, Patient being monitored and Timeout performed Patient Re-evaluated:Patient Re-evaluated prior to induction Oxygen Delivery Method: Simple face mask Preoxygenation: Pre-oxygenation with 100% oxygen Induction Type: IV induction Placement Confirmation: CO2 detector and positive ETCO2

## 2020-11-21 NOTE — Interval H&P Note (Signed)
History and Physical Interval Note:  11/21/2020 7:39 AM  Gabriella Robinson  has presented today for surgery, with the diagnosis of CCA Screen.  The various methods of treatment have been discussed with the patient and family. After consideration of risks, benefits and other options for treatment, the patient has consented to  Procedure(s): COLONOSCOPY WITH PROPOFOL (N/A) as a surgical intervention.  The patient's history has been reviewed, patient examined, no change in status, stable for surgery.  I have reviewed the patient's chart and labs.  Questions were answered to the patient's satisfaction.     Lesly Rubenstein  Ok to proceed with colonoscopy

## 2020-11-21 NOTE — Anesthesia Preprocedure Evaluation (Signed)
Anesthesia Evaluation  Patient identified by MRN, date of birth, ID band Patient awake    Reviewed: Allergy & Precautions, H&P , NPO status , Patient's Chart, lab work & pertinent test results, reviewed documented beta blocker date and time   History of Anesthesia Complications Negative for: history of anesthetic complications  Airway Mallampati: II  TM Distance: >3 FB Neck ROM: full    Dental  (+) Dental Advidsory Given, Teeth Intact   Pulmonary neg shortness of breath, sleep apnea and Continuous Positive Airway Pressure Ventilation , neg COPD, neg recent URI,    Pulmonary exam normal breath sounds clear to auscultation       Cardiovascular Exercise Tolerance: Good hypertension, (-) angina(-) Past MI and (-) Cardiac Stents Normal cardiovascular exam(-) dysrhythmias (-) Valvular Problems/Murmurs Rhythm:regular Rate:Normal     Neuro/Psych neg Seizures MS  Neuromuscular disease (fibromyalgia) negative psych ROS   GI/Hepatic Neg liver ROS, GERD  ,  Endo/Other  diabetes  Renal/GU negative Renal ROS  negative genitourinary   Musculoskeletal   Abdominal   Peds  Hematology negative hematology ROS (+)   Anesthesia Other Findings Past Medical History: No date: Chronic back pain No date: Diabetes ([place]) No date: HBP (high blood pressure) No date: History of blood clots 11/12/2013: Lumbar canal stenosis No date: Migraines No date: MS (multiple sclerosis) ([facility]) No date: Sinus complaint No date: Sleep apnea   Reproductive/Obstetrics negative OB ROS                             Anesthesia Physical Anesthesia Plan  ASA: III  Anesthesia Plan: General   Post-op Pain Management:    Induction: Intravenous  PONV Risk Score and Plan: 3 and TIVA and Propofol infusion  Airway Management Planned: Natural Airway and Nasal Cannula  Additional Equipment:   Intra-op Plan:   Post-operative Plan:    Informed Consent: I have reviewed the patients History and Physical, chart, labs and discussed the procedure including the risks, benefits and alternatives for the proposed anesthesia with the patient or authorized representative who has indicated his/her understanding and acceptance.     Dental Advisory Given  Plan Discussed with: Anesthesiologist, CRNA and Surgeon  Anesthesia Plan Comments:         Anesthesia Quick Evaluation

## 2020-11-21 NOTE — Transfer of Care (Signed)
Immediate Anesthesia Transfer of Care Note  Patient: Gabriella Robinson  Procedure(s) Performed: COLONOSCOPY WITH PROPOFOL (N/A )  Patient Location: PACU  Anesthesia Type:General  Level of Consciousness: awake and sedated  Airway & Oxygen Therapy: Patient Spontanous Breathing and Patient connected to face mask oxygen  Post-op Assessment: Report given to RN and Post -op Vital signs reviewed and stable  Post vital signs: Reviewed and stable  Last Vitals:  Vitals Value Taken Time  BP 125/81 11/21/20 0814  Temp 36.4 C 11/21/20 0812  Pulse 87 11/21/20 0815  Resp 9 11/21/20 0815  SpO2 96 % 11/21/20 0815  Vitals shown include unvalidated device data.  Last Pain:  Vitals:   11/21/20 0812  TempSrc: Temporal  PainSc: 0-No pain         Complications: No complications documented.

## 2020-11-21 NOTE — Op Note (Signed)
[facility] Gastroenterology Patient Name: Gabriella Robinson Procedure Date: 11/21/2020 7:37 AM MRN: [id number] Account #: [id number] Date of Birth: 1960-09-09 Admit Type: Outpatient Age: 60 Room: [facility] ENDO ROOM 3 Gender: Female Note Status: Finalized Procedure:             Colonoscopy Indications:           Screening for colorectal malignant neoplasm Providers:             Andrey Farmer MD, MD Referring MD:          Casilda Carls, MD (Referring MD) Medicines:             Monitored Anesthesia Care Complications:         No immediate complications. Estimated blood loss:                         Minimal. Procedure:             Pre-Anesthesia Assessment:                        - Prior to the procedure, a History and Physical was                         performed, and patient medications and allergies were                         reviewed. The patient is competent. The risks and                         benefits of the procedure and the sedation options and                         risks were discussed with the patient. All questions                         were answered and informed consent was obtained.                         Patient identification and proposed procedure were                         verified by the physician, the nurse, the anesthetist                         and the technician in the endoscopy suite. Mental                         Status Examination: alert and oriented. Airway                         Examination: normal oropharyngeal airway and neck                         mobility. Respiratory Examination: clear to                         auscultation. CV Examination: normal. Prophylactic  Antibiotics: The patient does not require prophylactic                         antibiotics. Prior Anticoagulants: The patient has                         taken no previous anticoagulant or antiplatelet                         agents. ASA Grade  Assessment: II - A patient with mild                         systemic disease. After reviewing the risks and                         benefits, the patient was deemed in satisfactory                         condition to undergo the procedure. The anesthesia                         plan was to use monitored anesthesia care (MAC).                         Immediately prior to administration of medications,                         the patient was re-assessed for adequacy to receive                         sedatives. The heart rate, respiratory rate, oxygen                         saturations, blood pressure, adequacy of pulmonary                         ventilation, and response to care were monitored                         throughout the procedure. The physical status of the                         patient was re-assessed after the procedure.                        After obtaining informed consent, the colonoscope was                         passed under direct vision. Throughout the procedure,                         the patient's blood pressure, pulse, and oxygen                         saturations were monitored continuously. The                         Colonoscope was introduced through the anus and  advanced to the the cecum, identified by appendiceal                         orifice and ileocecal valve. The colonoscopy was                         performed without difficulty. The patient tolerated                         the procedure well. The quality of the bowel                         preparation was adequate to identify polyps. Findings:      The perianal and digital rectal examinations were normal.      A less than 1 mm polyp was found in the ascending colon. The polyp was       sessile. The polyp was removed with a jumbo cold forceps. Resection and       retrieval were complete. Estimated blood loss was minimal.      The exam was otherwise without  abnormality on direct and retroflexion       views. Impression:            - One less than 1 mm polyp in the ascending colon,                         removed with a jumbo cold forceps. Resected and                         retrieved.                        - The examination was otherwise normal on direct and                         retroflexion views. Recommendation:        - Discharge patient to home.                        - Resume previous diet.                        - Continue present medications.                        - Await pathology results.                        - Repeat colonoscopy for surveillance based on                         pathology results.                        - Return to referring physician as previously                         scheduled. Procedure Code(s):     --- Professional ---                        [phone], Colonoscopy, flexible; with biopsy,  single or                         multiple Diagnosis Code(s):     --- Professional ---                        Z12.11, Encounter for screening for malignant neoplasm                         of colon                        K63.5, Polyp of colon CPT copyright 2019 [employer]. All rights reserved. The codes documented in this report are preliminary and upon coder review may  be revised to meet current compliance requirements. Andrey Farmer MD, MD 11/21/2020 8:13:26 AM Number of Addenda: 0 Note Initiated On: 11/21/2020 7:37 AM Estimated Blood Loss:  Estimated blood loss was minimal.      [facility]

## 2020-11-22 ENCOUNTER — Encounter: Payer: Self-pay | Admitting: Gastroenterology

## 2020-11-22 LAB — SURGICAL PATHOLOGY

## 2020-12-19 ENCOUNTER — Ambulatory Visit: Payer: Medicare Other | Admitting: Dermatology

## 2021-01-23 ENCOUNTER — Ambulatory Visit (INDEPENDENT_AMBULATORY_CARE_PROVIDER_SITE_OTHER): Payer: Medicare Other | Admitting: Dermatology

## 2021-01-23 ENCOUNTER — Other Ambulatory Visit: Payer: Self-pay

## 2021-01-23 DIAGNOSIS — L219 Seborrheic dermatitis, unspecified: Secondary | ICD-10-CM

## 2021-01-23 DIAGNOSIS — L74519 Primary focal hyperhidrosis, unspecified: Secondary | ICD-10-CM | POA: Diagnosis not present

## 2021-01-23 DIAGNOSIS — L988 Other specified disorders of the skin and subcutaneous tissue: Secondary | ICD-10-CM | POA: Diagnosis not present

## 2021-01-23 MED ORDER — KETOCONAZOLE 2 % EX CREA: TOPICAL_CREAM | CUTANEOUS | 2 refills | Status: DC

## 2021-01-23 NOTE — Progress Notes (Signed)
Follow-Up Visit   Subjective  Gabriella Robinson is a 60 y.o. female who presents for the following: Excessive Sweating (Patient here today for hyperhidrosis follow up. She has taken Robinul 1mg  bid in the past but has not needed to take any in the past month. The only side effect she has on Robinul is dry mouth. ) and Facial Elastosis (Patient here today for Botox. She has not had botox injections before. ).    The following portions of the chart were reviewed this encounter and updated as appropriate:        Review of Systems:  No other skin or systemic complaints except as noted in HPI or Assessment and Plan.  Objective  Well appearing patient in no apparent distress; mood and affect are within normal limits.  A focused examination was performed including face. Relevant physical exam findings are noted in the Assessment and Plan.  Head - Anterior (Face) Excessive moisture forehead  Head - Anterior (Face) Rhytides and volume loss.            nasolabial fold Mild erythema and scale   Assessment & Plan  Primary focal hyperhidrosis Head - Anterior (Face)  Some improvement with oral treatment  Continue Robinul 1mg  twice daily as needed.  Will also try cosmetic Botox for forehead to see if improves sweating  Glycopyrrolate can significantly increase the risk of heat stroke so you should avoid using it in the heat, particularly while active. It can also cause dry mouth, blurred vision, difficulty with urination, headache, constipation, and racing heart. Take it only as directed. Never take more than 8 tablets total per day.   Elastosis of skin Head - Anterior (Face)  Filling material injection - Head - Anterior (Face) Location: See attached image  Informed consent: Discussed risks (infection, pain, bleeding, bruising, swelling, allergic reaction, paralysis of nearby muscles, eyelid droop, double vision, neck weakness, difficulty breathing, headache, undesirable  cosmetic result, and need for additional treatment) and benefits of the procedure, as well as the alternatives.  Informed consent was obtained.  Preparation: The area was cleansed with alcohol.  Procedure Details:  Botox was injected into the dermis with a 30-gauge needle. Pressure applied to any bleeding. Ice packs offered for swelling.  Lot Number:  [id number] Expiration:  05/2022  Total Units Injected:  25  Plan: Patient was instructed to remain upright for 4 hours. Patient was instructed to avoid massaging the face and avoid vigorous exercise for the rest of the day. Tylenol may be used for headache.  Allow 2 weeks before returning to clinic for additional dosing as needed. Patient will call for any problems.   Seborrheic dermatitis nasolabial fold  Mild  Start 2% ketoconazole cream qd/bid aas face  Seborrheic Dermatitis  -  is a chronic persistent rash characterized by pinkness and scaling most commonly of the mid face but also can occur on the scalp (dandruff), ears; mid chest and mid back. It tends to be exacerbated by stress and cooler weather.  People who have neurologic disease may experience new onset or exacerbation of existing seborrheic dermatitis.  The condition is not curable but treatable and can be controlled.   ketoconazole (NIZORAL) 2 % cream - nasolabial fold Apply to affected areas at face 1-2 times daily as needed  Return if symptoms worsen or fail to improve.  Graciella Belton, RMA, am acting as scribe for Brendolyn Patty, MD .  Documentation: I have reviewed the above documentation for accuracy and completeness, and  I agree with the above.  Brendolyn Patty MD

## 2021-01-23 NOTE — Patient Instructions (Addendum)
Glycopyrrolate can significantly increase the risk of heat stroke so you should avoid using it in the heat, particularly while active. It can also cause dry mouth, blurred vision, difficulty with urination, headache, constipation, and racing heart. Take it only as directed. Never take more than 8 tablets total per day.  If you have any questions or concerns for your doctor, please call our main line at [phone] and press option 4 to reach your doctor's medical assistant. If no one answers, please leave a voicemail as directed and we will return your call as soon as possible. Messages left after 4 pm will be answered the following business day.   You may also send [place] a message via Bud. We typically respond to MyChart messages within 1-2 business days.  For prescription refills, please ask your pharmacy to contact our office. Our fax number is [phone].  If you have an urgent issue when the clinic is closed that cannot wait until the next business day, you can page your doctor at the number below.    Please note that while we do our best to be available for urgent issues outside of office hours, we are not available 24/7.   If you have an urgent issue and are unable to reach [place], you may choose to seek medical care at your doctor's office, retail clinic, urgent care center, or emergency room.  If you have a medical emergency, please immediately call 911 or go to the emergency department.  Pager Numbers  - Dr. Nehemiah Massed: [phone]  - Dr. Laurence Ferrari: [phone]  - Dr. Nicole Kindred: [phone]  In the event of inclement weather, please call our main line at [phone] for an update on the status of any delays or closures.  Dermatology Medication Tips: Please keep the boxes that topical medications come in in order to help keep track of the instructions about where and how to use these. Pharmacies typically print the medication instructions only on the boxes and not directly on the  medication tubes.   If your medication is too expensive, please contact our office at [phone] option 4 or send [place] a message through [place].   We are unable to tell what your co-pay for medications will be in advance as this is different depending on your insurance coverage. However, we may be able to find a substitute medication at lower cost or fill out paperwork to get insurance to cover a needed medication.   If a prior authorization is required to get your medication covered by your insurance company, please allow [place] 1-2 business days to complete this process.  Drug prices often vary depending on where the prescription is filled and some pharmacies may offer cheaper prices.  The website www.goodrx.com contains coupons for medications through different pharmacies. The prices here do not account for what the cost may be with help from insurance (it may be cheaper with your insurance), but the website can give you the price if you did not use any insurance.  - You can print the associated coupon and take it with your prescription to the pharmacy.  - You may also stop by our office during regular business hours and pick up a GoodRx coupon card.  - If you need your prescription sent electronically to a different pharmacy, notify our office through [facility] or by phone at [phone] option 4.

## 2021-06-14 ENCOUNTER — Encounter: Payer: Self-pay | Admitting: Emergency Medicine

## 2021-06-14 ENCOUNTER — Ambulatory Visit
Admission: EM | Admit: 2021-06-14 | Discharge: 2021-06-14 | Disposition: A | Discharge status: Home / Self Care | Payer: Medicare Other | Attending: Emergency Medicine | Admitting: Emergency Medicine

## 2021-06-14 ENCOUNTER — Other Ambulatory Visit: Payer: Self-pay

## 2021-06-14 DIAGNOSIS — N3000 Acute cystitis without hematuria: Secondary | ICD-10-CM | POA: Insufficient documentation

## 2021-06-14 DIAGNOSIS — B9689 Other specified bacterial agents as the cause of diseases classified elsewhere: Secondary | ICD-10-CM | POA: Insufficient documentation

## 2021-06-14 DIAGNOSIS — N760 Acute vaginitis: Secondary | ICD-10-CM | POA: Diagnosis present

## 2021-06-14 LAB — URINALYSIS, COMPLETE (UACMP) WITH MICROSCOPIC
Bilirubin Urine: NEGATIVE
Glucose, UA: NEGATIVE mg/dL
Hgb urine dipstick: NEGATIVE
Ketones, ur: NEGATIVE mg/dL
Nitrite: POSITIVE — AB
Protein, ur: NEGATIVE mg/dL
Specific Gravity, Urine: 1.025 (ref 1.005–1.030)
WBC, UA: >50 WBC/hpf (ref 0–5)
pH: 6.5 (ref 5.0–8.0)

## 2021-06-14 LAB — WET PREP, GENITAL
Sperm: NONE SEEN
Trich, Wet Prep: NONE SEEN
Yeast Wet Prep HPF POC: NONE SEEN

## 2021-06-14 MED ORDER — PHENAZOPYRIDINE HCL 200 MG PO TABS: 200.0000 mg | ORAL_TABLET | Freq: Three times a day (TID) | ORAL | 0 refills | Status: DC | PRN

## 2021-06-14 MED ORDER — CEPHALEXIN 500 MG PO CAPS: 500.0000 mg | ORAL_CAPSULE | Freq: Four times a day (QID) | ORAL | 0 refills | Status: DC

## 2021-06-14 MED ORDER — METRONIDAZOLE 500 MG PO TABS: 500.0000 mg | ORAL_TABLET | Freq: Two times a day (BID) | ORAL | 0 refills | Status: AC

## 2021-06-14 NOTE — ED Triage Notes (Signed)
Pt presents today with c/o of dysuria with urine frequency/hesitancy for one week. Denies fever. +left flank pain.

## 2021-06-14 NOTE — ED Provider Notes (Addendum)
HPI  SUBJECTIVE:  Gabriella Robinson is a 60 y.o. female who presents with dysuria, urgency, frequency, intermittent nausea present before urinating, low abdominal pressure before urinating, cloudy urine.for the past 2 and half weeks that is getting worse. States that she is urinating small amounts at a time, but is able to fully empty her bladder.  She reports an odor but is not sure if it is a vaginal odor or coming from the urine.  No hematuria.  No vaginal bleeding, discharge, rash, itching.  She reports left-sided back pain last night.  It has resolved.  No flank pain, fevers, body aches.  She has not been sexually active in over a year.  No recent antibiotics.  No antipyretic in the past 6 hours.  She tried increasing fluids, cranberry pills, Tylenol and ibuprofen.  Tylenol and ibuprofen help.  No aggravating factors.  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  No history of pyelonephritis, nephrolithiasis, BV, yeast.  Last UTI was in October which was treated successfully with Bactrim.  [id number], Clayborne Artist, MD   Past Medical History:  Diagnosis Date   Chronic back pain    Diabetes ([place])    HBP (high blood pressure)    History of blood clots    Lumbar canal stenosis 11/12/2013   Migraines    MS (multiple sclerosis) ([place])    Sinus complaint    Sleep apnea     Past Surgical History:  Procedure Laterality Date   ABDOMINAL HYSTERECTOMY     BACK SURGERY  12/06/2009   left L4-L5 micro decompression, partial facetectomy and foraminotomy   COLONOSCOPY WITH PROPOFOL N/A 11/21/2020   Procedure: COLONOSCOPY WITH PROPOFOL;  Surgeon: Lesly Rubenstein, MD;  Location: [facility] ENDOSCOPY;  Service: Endoscopy;  Laterality: N/A;   ECTOPIC PREGNANCY SURGERY     EYE SURGERY Bilateral    FOOT SURGERY Bilateral    OOPHORECTOMY Left    pt has right ovary still    Family History  Problem Relation Age of Onset   Diabetes Mother     Breast cancer Neg Hx     Social History   Tobacco Use   Smoking status: Never   Smokeless tobacco: Never  Vaping Use   Vaping Use: Never used  Substance Use Topics   Alcohol use: No   Drug use: Not Currently    No current facility-administered medications for this encounter.  Current Outpatient Medications:    cephALEXin (KEFLEX) 500 MG capsule, Take 1 capsule (500 mg total) by mouth 4 (four) times daily for 10 days., Disp: 40 capsule, Rfl: 0   metroNIDAZOLE (FLAGYL) 500 MG tablet, Take 1 tablet (500 mg total) by mouth 2 (two) times daily for 7 days., Disp: 14 tablet, Rfl: 0   phenazopyridine (PYRIDIUM) 200 MG tablet, Take 1 tablet (200 mg total) by mouth 3 (three) times daily as needed for pain., Disp: 6 tablet, Rfl: 0   acetaminophen (TYLENOL) 500 MG tablet, Take 1,000 mg by mouth every 4 (four) hours as needed for pain., Disp: , Rfl:    albuterol (VENTOLIN HFA) 108 (90 Base) MCG/ACT inhaler, Inhale 2 puffs into the lungs every 4 (four) hours as needed for wheezing or shortness of breath., Disp: 1 each, Rfl: 0   atorvastatin (LIPITOR) 10 MG tablet, Take 10 mg by mouth daily., Disp: , Rfl:    Baclofen 5 MG TABS, Take 5 mg by mouth at bedtime. ,  Disp: , Rfl:    blood glucose meter kit and supplies KIT, Dispense based on patient and insurance preference. Use up to four times daily as directed. (FOR ICD-9 250.00, 250.01)., Disp: 1 each, Rfl: 0   EMGALITY 120 MG/ML SOSY, Inject 120 mg into the skin every 28 (twenty-eight) days., Disp: , Rfl:    famotidine (PEPCID) 20 MG tablet, Take 1 tablet (20 mg total) by mouth daily., Disp: 14 tablet, Rfl: 0   gabapentin (NEURONTIN) 800 MG tablet, Take 800 mg by mouth 2 (two) times daily., Disp: , Rfl:    glipiZIDE (GLUCOTROL XL) 5 MG 24 hr tablet, Take 5 mg by mouth daily with breakfast. , Disp: , Rfl: 3   glycopyrrolate (ROBINUL) 1 MG tablet, Take 1 tablet (1 mg total) by mouth 2 (two) times daily., Disp: 60 tablet, Rfl: 2    guaiFENesin-dextromethorphan (ROBITUSSIN DM) 100-10 MG/5ML syrup, Take 5 mLs by mouth every 4 (four) hours as needed for cough., Disp: 118 mL, Rfl: 0   hydrochlorothiazide (HYDRODIURIL) 25 MG tablet, Take 25 mg by mouth daily., Disp: , Rfl:    insulin glargine (LANTUS) 100 UNIT/ML Solostar Pen, Inject 10 Units into the skin daily., Disp: 15 mL, Rfl: 1   Insulin Pen Needle (NOVOFINE) 30G X 8 MM MISC, Inject 10 each into the skin as needed., Disp: 1 packet, Rfl: 1   interferon beta-1a (REBIF) 22 MCG/0.5ML injection, Inject 22 mcg into the skin 3 (three) times a week. Monday Wednesday and Friday, Disp: , Rfl:    ketoconazole (NIZORAL) 2 % cream, Apply to affected areas at face 1-2 times daily as needed, Disp: 30 g, Rfl: 2   losartan (COZAAR) 50 MG tablet, Take 50 mg by mouth daily. , Disp: , Rfl:    meloxicam (MOBIC) 7.5 MG tablet, Take 7.5 mg by mouth 2 (two) times daily., Disp: , Rfl:    metFORMIN (GLUCOPHAGE) 1000 MG tablet, Take 1,000 mg by mouth 2 (two) times daily., Disp: , Rfl:    mometasone-formoterol (DULERA) 100-5 MCG/ACT AERO, Inhale 2 puffs into the lungs 2 (two) times daily., Disp: 1 each, Rfl: 0   ondansetron (ZOFRAN) 4 MG tablet, Take 4 mg by mouth every 8 (eight) hours as needed., Disp: , Rfl:    SUMAtriptan (IMITREX) 100 MG tablet, Take 100 mg by mouth every 2 (two) hours as needed. , Disp: , Rfl:    tiZANidine (ZANAFLEX) 4 MG tablet, Take 4 mg by mouth 3 (three) times daily., Disp: , Rfl:    TOPAMAX 100 MG tablet, Take 100 mg by mouth 2 (two) times daily., Disp: , Rfl:   No Known Allergies   ROS  As noted in HPI.   Physical Exam  BP (!) 141/86 (BP Location: Right Arm)   Pulse 88   Temp 98.2 F (36.8 C) (Oral)   Resp 18   SpO2 98%   Constitutional: Well developed, well nourished, no acute distress Eyes:  EOMI, conjunctiva normal bilaterally HENT: Normocephalic, atraumatic,mucus membranes moist Respiratory: Normal inspiratory effort Cardiovascular: Normal rate GI:  nondistended soft.  No suprapubic, flank tenderness Back: No CVAT skin: No rash, skin intact Musculoskeletal: no deformities Neurologic: Alert & oriented x 3, no focal neuro deficits Psychiatric: Speech and behavior appropriate   ED Course   Medications - No data to display  Orders Placed This Encounter  Procedures   Urine Culture    Standing Status:   Standing    Number of Occurrences:   1    Order Specific  Question:   Indication    Answer:   Dysuria   Wet prep, genital    Standing Status:   Standing    Number of Occurrences:   1   Urinalysis, Complete w Microscopic Urine, Clean Catch    Standing Status:   Standing    Number of Occurrences:   1    Results for orders placed or performed during the hospital encounter of 06/14/21 (from the past 24 hour(s))  Urinalysis, Complete w Microscopic Urine, Clean Catch     Status: Abnormal   Collection Time: 06/14/21  1:41 PM  Result Value Ref Range   Color, Urine YELLOW YELLOW   APPearance CLOUDY (A) CLEAR   Specific Gravity, Urine 1.025 1.005 - 1.030   pH 6.5 5.0 - 8.0   Glucose, UA NEGATIVE NEGATIVE mg/dL   Hgb urine dipstick NEGATIVE NEGATIVE   Bilirubin Urine NEGATIVE NEGATIVE   Ketones, ur NEGATIVE NEGATIVE mg/dL   Protein, ur NEGATIVE NEGATIVE mg/dL   Nitrite POSITIVE (A) NEGATIVE   Leukocytes,Ua TRACE (A) NEGATIVE   Squamous Epithelial / LPF 0-5 0 - 5   WBC, UA >50 0 - 5 WBC/hpf   RBC / HPF 6-10 0 - 5 RBC/hpf   Bacteria, UA MANY (A) NONE SEEN  Wet prep, genital     Status: Abnormal   Collection Time: 06/14/21  2:46 PM   Specimen: Urine, Clean Catch  Result Value Ref Range   Yeast Wet Prep HPF POC NONE SEEN NONE SEEN   Trich, Wet Prep NONE SEEN NONE SEEN   Clue Cells Wet Prep HPF POC PRESENT (A) NONE SEEN   WBC, Wet Prep HPF POC FEW (A) NONE SEEN   Sperm NONE SEEN    No results found.  ED Clinical Impression  1. Acute cystitis without hematuria   2. BV (bacterial vaginosis)      ED Assessment/Plan   UA  positive for pyuria, positive nitrite and esterase, no hematuria.  This is a clean specimen.  Presentation most consistent with a UTI.  Will send urine off for culture to confirm antibiotic choice.  Last urine culture February 2019, E. coli UTI that was pansensitive.  Will treat as a complicated urinary tract infection because she is reporting back pain.  However, do not think that is yet progressed to pyelonephritis as she has no fevers, flank or CVA tenderness.  We will also check a wet prep because she is not sure if the odor is coming from the vagina or from the urine  Wet prep positive for BV.  Prescribing Flagyl.  Attempted call patient, left message for her to call the UC back.  will also have staff attempt to reach patient and notify her of positive BV results/prescription waiting for her.  Discussed labs,MDM, treatment plan, and plan for follow-up with patient. . patient agrees with plan.   Meds ordered this encounter  Medications   cephALEXin (KEFLEX) 500 MG capsule    Sig: Take 1 capsule (500 mg total) by mouth 4 (four) times daily for 10 days.    Dispense:  40 capsule    Refill:  0   phenazopyridine (PYRIDIUM) 200 MG tablet    Sig: Take 1 tablet (200 mg total) by mouth 3 (three) times daily as needed for pain.    Dispense:  6 tablet    Refill:  0   metroNIDAZOLE (FLAGYL) 500 MG tablet    Sig: Take 1 tablet (500 mg total) by mouth 2 (two) times daily for  7 days.    Dispense:  14 tablet    Refill:  0       *This clinic note was created using [employer]. Therefore, there may be occasional mistakes despite careful proofreading.  ?    Melynda Ripple, MD 06/15/21 [id number]    Melynda Ripple, MD 06/15/21 [phone]

## 2021-06-14 NOTE — Discharge Instructions (Addendum)
Your wet prep is still pending.  I am sorry it is taking so long.  I will contact you if it is positive for yeast or bacterial vaginosis.  If you have a yeast infection I will prescribe Diflucan.  If positive for BV, I will prescribe Flagyl.  I have sent your urine off for culture to make sure that we have you on the right antibiotic.  Please increase your fluid intake until your urine is clear.  Pyridium will turn your urine orange, but will help with your urinary symptoms.  Finish the Keflex, even if you feel better.  This should take care of the urinary tract infection.  We will contact you if we need to change your antibiotics.

## 2021-06-15 ENCOUNTER — Telehealth (HOSPITAL_COMMUNITY): Payer: Self-pay | Admitting: Emergency Medicine

## 2021-06-15 NOTE — Telephone Encounter (Signed)
Patient left voicemail for return call to provider.  Reviewed provider note and patient tested positive for BV and metronidazole sent to pharmacy.  Attempted to return call x 1, LVM

## 2021-06-16 LAB — URINE CULTURE: Culture: >=100000 — AB

## 2021-06-16 MED ORDER — NITROFURANTOIN MONOHYD MACRO 100 MG PO CAPS: 100.0000 mg | ORAL_CAPSULE | Freq: Two times a day (BID) | ORAL | 0 refills | Status: DC

## 2021-06-16 NOTE — Telephone Encounter (Signed)
Patient returned call and everything reviewed

## 2021-06-16 NOTE — Telephone Encounter (Signed)
Attempted to reach patient x 2, LVM Will need to swith abx for UTI as well based on culture

## 2021-10-25 ENCOUNTER — Other Ambulatory Visit: Payer: Self-pay | Admitting: Internal Medicine

## 2021-10-25 DIAGNOSIS — Z1231 Encounter for screening mammogram for malignant neoplasm of breast: Secondary | ICD-10-CM

## 2021-11-08 ENCOUNTER — Ambulatory Visit (INDEPENDENT_AMBULATORY_CARE_PROVIDER_SITE_OTHER): Payer: Medicare Other | Admitting: Dermatology

## 2021-11-08 ENCOUNTER — Encounter: Payer: Self-pay | Admitting: Dermatology

## 2021-11-08 DIAGNOSIS — R61 Generalized hyperhidrosis: Secondary | ICD-10-CM | POA: Diagnosis not present

## 2021-11-08 DIAGNOSIS — L74519 Primary focal hyperhidrosis, unspecified: Secondary | ICD-10-CM | POA: Diagnosis not present

## 2021-11-08 DIAGNOSIS — L820 Inflamed seborrheic keratosis: Secondary | ICD-10-CM | POA: Diagnosis not present

## 2021-11-08 DIAGNOSIS — L74511 Primary focal hyperhidrosis, face: Secondary | ICD-10-CM

## 2021-11-08 DIAGNOSIS — L219 Seborrheic dermatitis, unspecified: Secondary | ICD-10-CM

## 2021-11-08 MED ORDER — KETOCONAZOLE 2 % EX CREA: TOPICAL_CREAM | CUTANEOUS | 2 refills | Status: DC

## 2021-11-08 MED ORDER — GLYCOPYRROLATE 1 MG PO TABS: 1.0000 mg | ORAL_TABLET | Freq: Two times a day (BID) | ORAL | 3 refills | Status: DC

## 2021-11-08 NOTE — Patient Instructions (Addendum)
Snip Removal Aftercare ? ?Wash gently with soap and water everyday.   ?Apply Vaseline (and Band-Aid if desired) daily until healed.  ? ? ? ?Glycopyrrolate can significantly increase the risk of heat stroke so you should avoid using it in the heat, particularly while active. It can also cause dry mouth, blurred vision, difficulty with urination, headache, constipation, and racing heart. Take it only as directed. Never take more than 8 tablets total per day.  ? ?If You Need Anything After Your Visit ? ?If you have any questions or concerns for your doctor, please call our main line at [phone] and press option 4 to reach your doctor's medical assistant. If no one answers, please leave a voicemail as directed and we will return your call as soon as possible. Messages left after 4 pm will be answered the following business day.  ? ?You may also send [place] a message via MyChart. We typically respond to MyChart messages within 1-2 business days. ? ?For prescription refills, please ask your pharmacy to contact our office. Our fax number is [phone]. ? ?If you have an urgent issue when the clinic is closed that cannot wait until the next business day, you can page your doctor at the number below.   ? ?Please note that while we do our best to be available for urgent issues outside of office hours, we are not available 24/7.  ? ?If you have an urgent issue and are unable to reach [place], you may choose to seek medical care at your doctor's office, retail clinic, urgent care center, or emergency room. ? ?If you have a medical emergency, please immediately call 911 or go to the emergency department. ? ?Pager Numbers ? ?- Dr. Nehemiah Massed: [phone] ? ?- Dr. Laurence Ferrari: [phone] ? ?- Dr. Nicole Kindred: [phone] ? ?In the event of inclement weather, please call our main line at [phone] for an update on the status of any delays or closures. ? ?Dermatology Medication Tips: ?Please keep the boxes that topical medications come in in  order to help keep track of the instructions about where and how to use these. Pharmacies typically print the medication instructions only on the boxes and not directly on the medication tubes.  ? ?If your medication is too expensive, please contact our office at [phone] option 4 or send [place] a message through [place].  ? ?We are unable to tell what your co-pay for medications will be in advance as this is different depending on your insurance coverage. However, we may be able to find a substitute medication at lower cost or fill out paperwork to get insurance to cover a needed medication.  ? ?If a prior authorization is required to get your medication covered by your insurance company, please allow [place] 1-2 business days to complete this process. ? ?Drug prices often vary depending on where the prescription is filled and some pharmacies may offer cheaper prices. ? ?The website www.goodrx.com contains coupons for medications through different pharmacies. The prices here do not account for what the cost may be with help from insurance (it may be cheaper with your insurance), but the website can give you the price if you did not use any insurance.  ?- You can print the associated coupon and take it with your prescription to the pharmacy.  ?- You may also stop by our office during regular business hours and pick up a GoodRx coupon card.  ?- If you need your prescription sent electronically to a different pharmacy, notify our office  through [facility] or by phone at [phone] option 4. ? ? ? ? ?Si Usted Necesita Algo Despu?s de Su Visita ? ?Tambi?n puede enviarnos un mensaje a trav?s de MyChart. Por lo general respondemos a los mensajes de MyChart en el transcurso de 1 a 2 d?as h?biles. ? ?Para renovar recetas, por favor pida a su farmacia que se ponga en contacto con nuestra oficina. Nuestro n?mero de fax es el [phone]. ? ?Si tiene un asunto urgente cuando la cl?nica est? cerrada y que no puede  esperar hasta el siguiente d?a h?bil, puede llamar/localizar a su doctor(a) al n?mero que aparece a continuaci?n.  ? ?Por favor, tenga en cuenta que aunque hacemos todo lo posible para estar disponibles para asuntos urgentes fuera del horario de oficina, no estamos disponibles las 24 horas del d?a, los 7 d?as de la semana.  ? ?Si tiene un problema urgente y no puede comunicarse con nosotros, puede optar por buscar atenci?n m?dica  en el consultorio de su doctor(a), en una cl?nica privada, en un centro de atenci?n urgente o en una sala de emergencias. ? ?Si tiene Engineer, maintenance (IT) m?dica, por favor llame inmediatamente al 911 o vaya a la sala de emergencias. ? ?N?meros de b?per ? ?- Dr. Nehemiah Massed: [phone] ? ?- Dra. Moye: [phone] ? ?- Dra. Nicole Kindred: [phone] ? ?En caso de inclemencias del tiempo, por favor llame a nuestra l?nea principal al [phone] para una actualizaci?n sobre el estado de cualquier retraso o cierre. ? ?Consejos para la medicaci?n en dermatolog?a: ?Por favor, guarde las cajas en las que vienen los medicamentos de uso t?pico para ayudarle a seguir las instrucciones sobre d?nde y c?mo usarlos. Las farmacias generalmente imprimen las instrucciones del medicamento s?lo en las cajas y no directamente en los tubos del [place].  ? ?Si su medicamento es muy caro, por favor, p?ngase en contacto con Zigmund Daniel llamando al [phone] y presione la opci?n 4 o env?enos un mensaje a trav?s de MyChart.  ? ?No podemos decirle cu?l ser? su copago por los medicamentos por adelantado ya que esto es diferente dependiendo de la cobertura de su seguro. Sin embargo, es posible que podamos encontrar un medicamento sustituto a Electrical engineer un formulario para que el seguro cubra el medicamento que se considera necesario.  ? ?Si se requiere Ardelia Mems autorizaci?n previa para que su compa??a de seguros [place] su medicamento, por favor perm?tanos de 1 a 2 d?as h?biles para completar este proceso. ? ?Los  precios de los medicamentos var?an con frecuencia dependiendo del Environmental consultant de d?nde se surte la receta y alguna farmacias pueden ofrecer precios m?s baratos. ? ?El sitio web www.goodrx.com tiene cupones para medicamentos de Airline pilot. Los precios aqu? no tienen en cuenta lo que podr?a costar con la ayuda del seguro (puede ser m?s barato con su seguro), pero el sitio web puede darle el precio si no utiliz? ning?n seguro.  ?- Puede imprimir el cup?n correspondiente y llevarlo con su receta a la farmacia.  ?- Tambi?n puede pasar por nuestra oficina durante el horario de atenci?n regular y recoger una tarjeta de cupones de GoodRx.  ?- Si necesita que su receta se env?e electr?nicamente a Chiropodist, informe a nuestra oficina a trav?s de MyChart de [place] o por tel?fono llamando al [phone] y presione la opci?n 4.  ?

## 2021-11-08 NOTE — Progress Notes (Addendum)
? ?Follow-Up Visit ?  ?Subjective  ?Gabriella Robinson is a 61 y.o. female who presents for the following: Nevus (Face, neck, chest. Raised, irritated. Itching. Rubbed by clothing. Would like removed) and Excessive Sweating (Refill Robinul '1mg'$ . Takes when needed. Has improved since Botox injections). Refill Ketoconazole cream for seb derm.  ? ?The patient has spots, moles and lesions to be evaluated, some may be new or changing and the patient has concerns that these could be cancer. ? ? ?The following portions of the chart were reviewed this encounter and updated as appropriate:   ?  ? ?Review of Systems: No other skin or systemic complaints except as noted in HPI or Assessment and Plan. ? ? ?Objective  ?Well appearing patient in no apparent distress; mood and affect are within normal limits. ? ?A focused examination was performed including face, neck, chest. Relevant physical exam findings are noted in the Assessment and Plan. ? ?posterior and B/L neck, chest x35 (35) ?Tiny waxy brown papules, some stuck on, some pedunculated ? ?forehead, temples ?Normal appearing skin today. ? ?face ?Clear today ? ? ?Assessment & Plan  ?Inflamed seborrheic keratosis (35) ?posterior and B/L neck, chest x35 ? ?ISK/DPN ? ? ? ?Destruction of lesion - posterior and B/L neck, chest x35 ? ?Destruction method: electrodesiccation and curettage   ?Destruction method comment:  Snip removal x15, ED x 20, no curretage ?Informed consent: discussed and consent obtained   ?Anesthesia: the lesion was anesthetized in a standard fashion   ?Anesthetic:  1% lidocaine w/ epinephrine 1-100,000 local infiltration ?Hemostasis achieved with:  pressure, aluminum chloride and electrodesiccation ?Outcome: patient tolerated procedure well with no complications   ?Post-procedure details: wound care instructions given   ?Additional details:  Prior to procedure, discussed risks of small wound, skin dyspigmentation, or rare scar following snip removal. Recommend  Vaseline ointment to treated areas while healing.  ? ?Primary focal hyperhidrosis ?forehead, temples ? ?Chronic condition with duration or expected duration over one year. Currently well-controlled.  ? ?Continue Glycopyrrolate '1mg'$  qd/bid prn ?Pt may repeat botox injection to forehead which really helped ? ?Glycopyrrolate can significantly increase the risk of heat stroke so you should avoid using it in the heat, particularly while active. It can also cause dry mouth, blurred vision, difficulty with urination, headache, constipation, and racing heart. Take it only as directed. Never take more than 8 tablets total per day.  ? ?Seborrheic dermatitis ?face ? ?Chronic condition with duration or expected duration over one year. Currently well-controlled.  ? ?Continue Ketoconazole 2% cream qd/bid to aas face prn flares ? ?Seborrheic Dermatitis  ?-  is a chronic persistent rash characterized by pinkness and scaling most commonly of the mid face but also can occur on the scalp (dandruff), ears; mid chest, mid back and groin.  It tends to be exacerbated by stress and cooler weather.  People who have neurologic disease may experience new onset or exacerbation of existing seborrheic dermatitis.  The condition is not curable but treatable and can be controlled.  ? ?ketoconazole (NIZORAL) 2 % cream - face ?Apply to affected areas at face 1-2 times daily as needed ? ?Hyperhidrosis ? ?Related Medications ?glycopyrrolate (ROBINUL) 1 MG tablet ?Take 1 tablet (1 mg total) by mouth 2 (two) times daily. ? ? ?Return in about 6 weeks (around 12/20/2021) for ISK Follow Up, treat face on f/up. ? ?I, Emelia Salisbury, CMA, am acting as scribe for Brendolyn Patty, MD. ? ?Documentation: I have reviewed the above documentation for accuracy and completeness,  and I agree with the above. ? ?Brendolyn Patty MD  ?

## 2021-12-06 ENCOUNTER — Ambulatory Visit
Admission: RE | Admit: 2021-12-06 | Discharge: 2021-12-06 | Disposition: A | Discharge status: Home / Self Care | Payer: Medicare Other | Source: Ambulatory Visit | Attending: Internal Medicine | Admitting: Internal Medicine

## 2021-12-06 DIAGNOSIS — Z1231 Encounter for screening mammogram for malignant neoplasm of breast: Secondary | ICD-10-CM | POA: Diagnosis not present

## 2022-01-02 ENCOUNTER — Ambulatory Visit (INDEPENDENT_AMBULATORY_CARE_PROVIDER_SITE_OTHER): Payer: Medicare Other | Admitting: Dermatology

## 2022-01-02 DIAGNOSIS — L821 Other seborrheic keratosis: Secondary | ICD-10-CM

## 2022-01-02 DIAGNOSIS — L820 Inflamed seborrheic keratosis: Secondary | ICD-10-CM | POA: Diagnosis not present

## 2022-01-02 NOTE — Progress Notes (Signed)
   Follow-Up Visit   Subjective  Gabriella Robinson is a 61 y.o. female who presents for the following: ISK (Face, pt presents for treatment). Symptomatic, irritating, patient would like treated.    The following portions of the chart were reviewed this encounter and updated as appropriate:       Review of Systems:  No other skin or systemic complaints except as noted in HPI or Assessment and Plan.  Objective  Well appearing patient in no apparent distress; mood and affect are within normal limits.  A focused examination was performed including face. Relevant physical exam findings are noted in the Assessment and Plan.  Lateral L cheek/L temple x 19, R lat cheek/temple x 14 (33) Stuck on waxy paps with erythema    Assessment & Plan  Inflamed seborrheic keratosis (33) Lateral L cheek/L temple x 19, R lat cheek/temple x 14  /DPN Symptomatic, irritating, patient would like treated.   ED to Lateral L cheek/L temple x 19, R lat cheek/temple x 14  Destruction of lesion - Lateral L cheek/L temple x 19, R lat cheek/temple x 14  Destruction method: electrodesiccation and curettage   Destruction method comment:  Electrodesiccation only Informed consent: discussed and consent obtained   Timeout:  patient name, date of birth, surgical site, and procedure verified Patient was prepped and draped in usual sterile fashion: patient was prepped with isopropyl alcohol. Outcome: patient tolerated procedure well with no complications   Post-procedure details: wound care instructions given   Additional details:  Prior to procedure, discussed risks of blister formation, small wound, skin dyspigmentation, or rare scar following cryotherapy. Recommend Vaseline ointment to treated areas while healing.    Seborrheic Keratoses - Stuck-on, waxy, tan-brown papules face - Benign-appearing - Discussed benign etiology and prognosis. - Observe - Call for any changes  Return if symptoms worsen or fail to  improve.  I, Othelia Pulling, RMA, am acting as scribe for Brendolyn Patty, MD .  Documentation: I have reviewed the above documentation for accuracy and completeness, and I agree with the above.  Brendolyn Patty MD

## 2022-01-02 NOTE — Patient Instructions (Addendum)

## 2022-07-25 ENCOUNTER — Ambulatory Visit
Admission: EM | Admit: 2022-07-25 | Discharge: 2022-07-25 | Disposition: A | Discharge status: Home / Self Care | Payer: Medicare Other | Attending: Emergency Medicine | Admitting: Emergency Medicine

## 2022-07-25 DIAGNOSIS — N390 Urinary tract infection, site not specified: Secondary | ICD-10-CM | POA: Diagnosis present

## 2022-07-25 DIAGNOSIS — B3731 Acute candidiasis of vulva and vagina: Secondary | ICD-10-CM

## 2022-07-25 LAB — URINALYSIS, ROUTINE W REFLEX MICROSCOPIC
Bilirubin Urine: NEGATIVE
Glucose, UA: 250 mg/dL — AB
Hgb urine dipstick: NEGATIVE
Ketones, ur: NEGATIVE mg/dL
Nitrite: POSITIVE — AB
Protein, ur: NEGATIVE mg/dL
Specific Gravity, Urine: 1.02 (ref 1.005–1.030)
pH: 7 (ref 5.0–8.0)

## 2022-07-25 LAB — URINALYSIS, MICROSCOPIC (REFLEX)

## 2022-07-25 MED ORDER — SULFAMETHOXAZOLE-TRIMETHOPRIM 800-160 MG PO TABS: 1.0000 | ORAL_TABLET | Freq: Two times a day (BID) | ORAL | 0 refills | Status: AC

## 2022-07-25 MED ORDER — PHENAZOPYRIDINE HCL 200 MG PO TABS: 200.0000 mg | ORAL_TABLET | Freq: Three times a day (TID) | ORAL | 0 refills | Status: DC | PRN

## 2022-07-25 MED ORDER — FLUCONAZOLE 150 MG PO TABS: 150.0000 mg | ORAL_TABLET | Freq: Once | ORAL | 1 refills | Status: DC

## 2022-07-25 MED ORDER — FLUCONAZOLE 150 MG PO TABS: 150.0000 mg | ORAL_TABLET | Freq: Once | ORAL | 1 refills | Status: AC

## 2022-07-25 MED ORDER — SULFAMETHOXAZOLE-TRIMETHOPRIM 800-160 MG PO TABS: 1.0000 | ORAL_TABLET | Freq: Two times a day (BID) | ORAL | 0 refills | Status: DC

## 2022-07-25 NOTE — Discharge Instructions (Signed)
Drink extra fluids.  Finish the Bactrim, even if you feel better.  Pyridium will help with your symptoms and the Diflucan will help with the yeast infection.

## 2022-07-25 NOTE — ED Provider Notes (Signed)
HPI  SUBJECTIVE:  Gabriella Robinson is a 61 y.o. female who presents with 4 days of low midline pelvic pressure with urination, dysuria, urinary urgency, frequency, cloudy, odorous urine and left low back pain starting yesterday.  She reports headache this morning.  No fevers, vaginal odor, discharge, rash, itching, other abdominal pain.  She states that she has not been sexually active in years.  She took Tylenol within 6 hours of evaluation with improvement in her symptoms.  No aggravating factors.  She has a past medical history of frequent UTIs, vaginal yeast infections, diabetes, hypertension, fibromyalgia, MS, status post hysterectomy.  No history of BV, pyelonephritis, nephrolithiasis.  [facility]   Past Medical History:  Diagnosis Date   Chronic back pain    Diabetes (HCC)    HBP (high blood pressure)    History of blood clots    Lumbar canal stenosis 11/12/2013   Migraines    MS (multiple sclerosis) ([place])    Sinus complaint    Sleep apnea     Past Surgical History:  Procedure Laterality Date   ABDOMINAL HYSTERECTOMY     BACK SURGERY  12/06/2009   left L4-L5 micro decompression, partial facetectomy and foraminotomy   COLONOSCOPY WITH PROPOFOL N/A 11/21/2020   Procedure: COLONOSCOPY WITH PROPOFOL;  Surgeon: Lesly Rubenstein, MD;  Location: [facility] ENDOSCOPY;  Service: Endoscopy;  Laterality: N/A;   ECTOPIC PREGNANCY SURGERY     EYE SURGERY Bilateral    FOOT SURGERY Bilateral    OOPHORECTOMY Left    pt has right ovary still    Family History  Problem Relation Age of Onset   Diabetes Mother    Breast cancer Neg Hx     Social History   Tobacco Use   Smoking status: Never   Smokeless tobacco: Never  Vaping Use   Vaping Use: Never used  Substance Use Topics   Alcohol use: No   Drug use: Not Currently    No current facility-administered medications for this encounter.  Current Outpatient Medications:    acetaminophen (TYLENOL) 500 MG tablet, Take 1,000 mg  by mouth every 4 (four) hours as needed for pain., Disp: , Rfl:    albuterol (VENTOLIN HFA) 108 (90 Base) MCG/ACT inhaler, Inhale 2 puffs into the lungs every 4 (four) hours as needed for wheezing or shortness of breath., Disp: 1 each, Rfl: 0   atorvastatin (LIPITOR) 10 MG tablet, Take 10 mg by mouth daily., Disp: , Rfl:    Baclofen 5 MG TABS, Take 5 mg by mouth at bedtime. , Disp: , Rfl:    blood glucose meter kit and supplies KIT, Dispense based on patient and insurance preference. Use up to four times daily as directed. (FOR ICD-9 250.00, 250.01)., Disp: 1 each, Rfl: 0   EMGALITY 120 MG/ML SOSY, Inject 120 mg into the skin every 28 (twenty-eight) days., Disp: , Rfl:    famotidine (PEPCID) 20 MG tablet, Take 1 tablet (20 mg total) by mouth daily., Disp: 14 tablet, Rfl: 0   fluconazole (DIFLUCAN) 150 MG tablet, Take 1 tablet (150 mg total) by mouth once for 1 dose. 1 tab po x 1. May repeat in 72 hours if no improvement, Disp: 2 tablet, Rfl: 1   gabapentin (NEURONTIN) 800 MG tablet, Take 800 mg by mouth 2 (two) times daily., Disp: , Rfl:    glipiZIDE (GLUCOTROL XL) 5 MG 24 hr tablet, Take 5 mg by mouth daily with breakfast. , Disp: , Rfl: 3   glycopyrrolate (ROBINUL)  1 MG tablet, Take 1 tablet (1 mg total) by mouth 2 (two) times daily., Disp: 60 tablet, Rfl: 3   hydrochlorothiazide (HYDRODIURIL) 25 MG tablet, Take 25 mg by mouth daily., Disp: , Rfl:    Insulin Pen Needle (NOVOFINE) 30G X 8 MM MISC, Inject 10 each into the skin as needed., Disp: 1 packet, Rfl: 1   interferon beta-1a (REBIF) 22 MCG/0.5ML injection, Inject 22 mcg into the skin 3 (three) times a week. Monday Wednesday and Friday, Disp: , Rfl:    ketoconazole (NIZORAL) 2 % cream, Apply to affected areas at face 1-2 times daily as needed, Disp: 30 g, Rfl: 2   losartan (COZAAR) 50 MG tablet, Take 50 mg by mouth daily. , Disp: , Rfl:    meloxicam (MOBIC) 7.5 MG tablet, Take 7.5 mg by mouth 2 (two) times daily., Disp: , Rfl:    metFORMIN  (GLUCOPHAGE) 1000 MG tablet, Take 1,000 mg by mouth 2 (two) times daily., Disp: , Rfl:    mometasone-formoterol (DULERA) 100-5 MCG/ACT AERO, Inhale 2 puffs into the lungs 2 (two) times daily., Disp: 1 each, Rfl: 0   ondansetron (ZOFRAN) 4 MG tablet, Take 4 mg by mouth every 8 (eight) hours as needed., Disp: , Rfl:    phenazopyridine (PYRIDIUM) 200 MG tablet, Take 1 tablet (200 mg total) by mouth 3 (three) times daily as needed for pain., Disp: 6 tablet, Rfl: 0   sulfamethoxazole-trimethoprim (BACTRIM DS) 800-160 MG tablet, Take 1 tablet by mouth 2 (two) times daily for 7 days., Disp: 14 tablet, Rfl: 0   SUMAtriptan (IMITREX) 100 MG tablet, Take 100 mg by mouth every 2 (two) hours as needed. , Disp: , Rfl:    TOPAMAX 100 MG tablet, Take 100 mg by mouth 2 (two) times daily., Disp: , Rfl:    insulin glargine (LANTUS) 100 UNIT/ML Solostar Pen, Inject 10 Units into the skin daily., Disp: 15 mL, Rfl: 1  No Known Allergies   ROS  As noted in HPI.   Physical Exam  BP 133/85 (BP Location: Left Arm)   Pulse 90   Temp 99.2 F (37.3 C) (Oral)   Resp 16   Ht 5' 8.5" (1.74 m)   Wt 99.8 kg   SpO2 93%   BMI 32.96 kg/m   Constitutional: Well developed, well nourished, no acute distress Eyes:  EOMI, conjunctiva normal bilaterally HENT: Normocephalic, atraumatic,mucus membranes moist Respiratory: Normal inspiratory effort Cardiovascular: Normal rate GI: nondistended.  Soft.  No suprapubic, flank tenderness. Left, very mild left CVAT skin: No rash, skin intact Musculoskeletal: no deformities Neurologic: Alert & oriented x 3, no focal neuro deficits Psychiatric: Speech and behavior appropriate   ED Course   Medications - No data to display  Orders Placed This Encounter  Procedures   Urine Culture    Standing Status:   Standing    Number of Occurrences:   1    Order Specific Question:   Indication    Answer:   Dysuria   Urinalysis, Routine w reflex microscopic    Standing Status:    Standing    Number of Occurrences:   1   Urinalysis, Microscopic (reflex)    Standing Status:   Standing    Number of Occurrences:   1    Results for orders placed or performed during the hospital encounter of 07/25/22 (from the past 24 hour(s))  Urinalysis, Routine w reflex microscopic Urine, Clean Catch     Status: Abnormal   Collection Time: 07/25/22  8:35  AM  Result Value Ref Range   Color, Urine YELLOW YELLOW   APPearance CLOUDY (A) CLEAR   Specific Gravity, Urine 1.020 1.005 - 1.030   pH 7.0 5.0 - 8.0   Glucose, UA 250 (A) NEGATIVE mg/dL   Hgb urine dipstick NEGATIVE NEGATIVE   Bilirubin Urine NEGATIVE NEGATIVE   Ketones, ur NEGATIVE NEGATIVE mg/dL   Protein, ur NEGATIVE NEGATIVE mg/dL   Nitrite POSITIVE (A) NEGATIVE   Leukocytes,Ua MODERATE (A) NEGATIVE  Urinalysis, Microscopic (reflex)     Status: Abnormal   Collection Time: 07/25/22  8:35 AM  Result Value Ref Range   RBC / HPF 0-5 0 - 5 RBC/hpf   WBC, UA 21-50 0 - 5 WBC/hpf   Bacteria, UA MANY (A) NONE SEEN   Squamous Epithelial / LPF 0-5 0 - 5   WBC Clumps PRESENT    Budding Yeast PRESENT    No results found.  ED Clinical Impression  1. Urinary tract infection without hematuria, site unspecified   2. Vaginal yeast infection      ED Assessment/Plan      Patient with a UTI with many bacteria, positive nitrite, moderate leukocytes, many bacteria.  Sending urine off for culture to confirm antibiotic choice.  She also has a yeast infection.  Outside labs reviewed.  She had a E. coli UTI that was resistant to cephalosporins, fluoroquinolones, intermediate to Augmentin, but sensitive to Macrobid, Bactrim on 04/25/2022.  Calculated creatinine clearance based on labs from July 2023 116 mL/min.  Concern for ascending UTI.  Doubt pyelonephritis at this time, vitals are normal, no history of fevers.  Will send home with Bactrim DS 1 tab for 7 days as that is our only option for complicated UTI.  Pyridium for symptom  relief.  Diflucan for vaginal yeast infection.  Follow-up with PCP as needed, she may need to be evaluated by urology since this is the fifth UTI that she has had this year.  ER return precautions given.  Discussed labs, MDM, treatment plan, and plan for follow-up with patient. Discussed sn/sx that should prompt return to the ED. patient agrees with plan.   Meds ordered this encounter  Medications   phenazopyridine (PYRIDIUM) 200 MG tablet    Sig: Take 1 tablet (200 mg total) by mouth 3 (three) times daily as needed for pain.    Dispense:  6 tablet    Refill:  0   sulfamethoxazole-trimethoprim (BACTRIM DS) 800-160 MG tablet    Sig: Take 1 tablet by mouth 2 (two) times daily for 7 days.    Dispense:  14 tablet    Refill:  0   fluconazole (DIFLUCAN) 150 MG tablet    Sig: Take 1 tablet (150 mg total) by mouth once for 1 dose. 1 tab po x 1. May repeat in 72 hours if no improvement    Dispense:  2 tablet    Refill:  1      *This clinic note was created using [employer]. Therefore, there may be occasional mistakes despite careful proofreading.  ?    Melynda Ripple, MD 07/26/22 [phone]

## 2022-07-25 NOTE — ED Triage Notes (Signed)
Pt c/o uti sx's ongoing x4 days, c/o frequency,pressure,lower back pain,& slight burning. Denies any hematuria or fevers at this time. Otc tylenol last dose last night around 11pm.

## 2022-07-28 LAB — URINE CULTURE: Culture: >=100000 — AB

## 2022-08-21 ENCOUNTER — Ambulatory Visit
Admission: EM | Admit: 2022-08-21 | Discharge: 2022-08-21 | Disposition: A | Discharge status: Home / Self Care | Payer: Medicare Other | Attending: Physician Assistant | Admitting: Physician Assistant

## 2022-08-21 ENCOUNTER — Encounter: Payer: Self-pay | Admitting: Emergency Medicine

## 2022-08-21 DIAGNOSIS — Z1152 Encounter for screening for COVID-19: Secondary | ICD-10-CM | POA: Diagnosis not present

## 2022-08-21 DIAGNOSIS — G35 Multiple sclerosis: Secondary | ICD-10-CM | POA: Diagnosis not present

## 2022-08-21 DIAGNOSIS — J029 Acute pharyngitis, unspecified: Secondary | ICD-10-CM | POA: Diagnosis present

## 2022-08-21 DIAGNOSIS — E119 Type 2 diabetes mellitus without complications: Secondary | ICD-10-CM | POA: Insufficient documentation

## 2022-08-21 DIAGNOSIS — R067 Sneezing: Secondary | ICD-10-CM | POA: Diagnosis not present

## 2022-08-21 DIAGNOSIS — R300 Dysuria: Secondary | ICD-10-CM | POA: Diagnosis present

## 2022-08-21 DIAGNOSIS — N898 Other specified noninflammatory disorders of vagina: Secondary | ICD-10-CM

## 2022-08-21 DIAGNOSIS — Z87440 Personal history of urinary (tract) infections: Secondary | ICD-10-CM | POA: Diagnosis not present

## 2022-08-21 DIAGNOSIS — G8929 Other chronic pain: Secondary | ICD-10-CM | POA: Diagnosis not present

## 2022-08-21 DIAGNOSIS — J069 Acute upper respiratory infection, unspecified: Secondary | ICD-10-CM

## 2022-08-21 DIAGNOSIS — Z7984 Long term (current) use of oral hypoglycemic drugs: Secondary | ICD-10-CM | POA: Diagnosis not present

## 2022-08-21 DIAGNOSIS — Z7951 Long term (current) use of inhaled steroids: Secondary | ICD-10-CM | POA: Insufficient documentation

## 2022-08-21 LAB — URINALYSIS, MICROSCOPIC (REFLEX)

## 2022-08-21 LAB — URINALYSIS, ROUTINE W REFLEX MICROSCOPIC
Bilirubin Urine: NEGATIVE
Glucose, UA: 500 mg/dL — AB
Ketones, ur: NEGATIVE mg/dL
Leukocytes,Ua: NEGATIVE
Nitrite: NEGATIVE
Protein, ur: NEGATIVE mg/dL
Specific Gravity, Urine: 1.02 (ref 1.005–1.030)
pH: 6 (ref 5.0–8.0)

## 2022-08-21 LAB — WET PREP, GENITAL
Clue Cells Wet Prep HPF POC: NONE SEEN
Sperm: NONE SEEN
Trich, Wet Prep: NONE SEEN
WBC, Wet Prep HPF POC: >10 — AB (ref <10)
Yeast Wet Prep HPF POC: NONE SEEN

## 2022-08-21 LAB — GROUP A STREP BY PCR: Group A Strep by PCR: NOT DETECTED

## 2022-08-21 LAB — SARS CORONAVIRUS 2 BY RT PCR: SARS Coronavirus 2 by RT PCR: NEGATIVE

## 2022-08-21 NOTE — Discharge Instructions (Addendum)
-  Your strep test was negative and your COVID test was also negative. - The urine is not consistent with a UTI but I am going to send it for culture and if bacteria grows someone will contact you and send antibiotic. - You are negative for yeast and BV. - At this time you may take OTC decongestants and use Chloraseptic spray, Tylenol, plenty rest and fluids.  Most colds get better in 1 to 2 weeks. - Increase your fluid intake and rest.  You may try AZO over-the-counter for burning sensation.  URI/COLD SYMPTOMS: Your exam today is consistent with a viral illness. Antibiotics are not indicated at this time. Use medications as directed, including cough syrup, nasal saline, and decongestants. Your symptoms should improve over the next few days and resolve within 7-10 days. Increase rest and fluids. F/u if symptoms worsen or predominate such as sore throat, ear pain, productive cough, shortness of breath, or if you develop high fevers or worsening fatigue over the next several days.

## 2022-08-21 NOTE — ED Triage Notes (Signed)
Pt c/o sore throat, sneezing, runny. Started about 2 days ago. Denies fever.   Pt also c/o dysuria, lower back . Started about 4 days ago.

## 2022-08-21 NOTE — ED Provider Notes (Signed)
[facility]    CSN: [id number] Arrival date & time: 08/21/22  0102      History   Chief Complaint Chief Complaint  Patient presents with   Sore Throat   Dysuria    HPI Gabriella Robinson is a 62 y.o. female presenting for multiple complaints.  First complaint is sore throat, sneezing and runny nose x 2 days.  No fever, fatigue, body aches or cough.  Denies any sick contacts or known exposure to COVID or flu.  Taking over-the-counter cold medication and states it has helped.  Additionally she is reporting dysuria and lower back pain on the left side for the past 4 days.  Also reports some vaginal itching a couple of days ago.  States she has burning with urination.  No vaginal discharge.  History of UTIs and yeast infections.  Patient does have a history of diabetes, chronic back pain, MS.  HPI  Past Medical History:  Diagnosis Date   Chronic back pain    Diabetes ([place])    HBP (high blood pressure)    History of blood clots    Lumbar canal stenosis 11/12/2013   Migraines    MS (multiple sclerosis) ([place])    Sinus complaint    Sleep apnea     Patient Active Problem List   Diagnosis Date Noted   Pneumonia due to COVID-19 virus 05/25/2020   HTN (hypertension) 05/25/2020   HLD (hyperlipidemia) 05/25/2020   Diabetes mellitus without complication ([place]) 72/53/6644   Hyperosmolar hyperglycemic state (HHS) ([place]) 05/25/2020   Hypokalemia 05/25/2020   AKI (acute kidney injury) ([place]) 05/25/2020   Acute respiratory failure with hypoxia ([place]) 05/25/2020   Severe sepsis ([place]) 05/25/2020   Chronic sacroiliac joint pain (Left) 09/20/2015   Lumbar spondylosis 09/20/2015   Encounter for therapeutic drug level monitoring 09/19/2015   Avitaminosis D 09/19/2015   Lumbar facet syndrome (Location of Secondary source of pain) (Bilateral) (L>R) 09/19/2015   Neurogenic pain 08/17/2015   Fibromyalgia 08/17/2015   Nocturnal muscle cramps (Bilateral lower extremities) 08/17/2015   Chronic  pain 07/21/2015   Long term current use of opiate analgesic 07/21/2015   Long term prescription opiate use 07/21/2015   Opiate use 07/21/2015   Opioid dependence, daily use ([place]) 03/47/4259   Uncomplicated opioid dependence ([place]) 07/21/2015   Adiposity 07/21/2015   Chronic neck pain (Location of Primary Source of Pain) (Right) 07/21/2015   Chronic cervical radicular pain (Right) (C7/C8 Dermatome) 07/21/2015   Chronic low back pain (Location of Secondary source of pain) (Left) 07/21/2015   Neuropathic pain 07/21/2015   Musculoskeletal pain 07/21/2015   Myofascial pain 07/21/2015   Diffuse myofascial pain syndrome 07/21/2015   Restless leg syndrome 07/21/2015   Carpal tunnel syndrome 06/09/2015   Migraine without aura and responsive to treatment 04/21/2015   Multiple sclerosis ([place]) 10/04/2014   Hand paresthesia 10/04/2014   Disordered sleep 10/04/2014   Enthesopathy of hip (Left) 11/12/2013   Neuropathy 11/12/2013    Past Surgical History:  Procedure Laterality Date   ABDOMINAL HYSTERECTOMY     BACK SURGERY  12/06/2009   left L4-L5 micro decompression, partial facetectomy and foraminotomy   COLONOSCOPY WITH PROPOFOL N/A 11/21/2020   Procedure: COLONOSCOPY WITH PROPOFOL;  Surgeon: Lesly Rubenstein, MD;  Location: [facility] END[facility]COPY;  Service: Endoscopy;  Laterality: N/A;   ECTOPIC PREGNANCY SURGERY     EYE SURGERY Bilateral    FOOT SURGERY Bilateral    OOPHORECTOMY Left    pt has right ovary still  OB History     Gravida  4   Para      Term      Preterm      AB  2   Living  2      SAB  1   IAB      Ectopic  1   Multiple      Live Births               Home Medications    Prior to Admission medications   Medication Sig Start Date End Date Taking? Authorizing Provider  atorvastatin (LIPITOR) 10 MG tablet Take 10 mg by mouth daily. 03/01/20  Yes [provider]  famotidine (PEPCID) 20 MG tablet Take 1 tablet (20 mg total) by mouth daily.  06/07/20  Yes Bonnielee Haff, MD  gabapentin (NEURONTIN) 800 MG tablet Take 800 mg by mouth 2 (two) times daily. 04/30/20  Yes [provider]  glipiZIDE (GLUCOTROL XL) 5 MG 24 hr tablet Take 5 mg by mouth daily with breakfast.  07/16/15  Yes [provider]  glycopyrrolate (ROBINUL) 1 MG tablet Take 1 tablet (1 mg total) by mouth 2 (two) times daily. 11/08/21  Yes Brendolyn Patty, MD  hydrochlorothiazide (HYDRODIURIL) 25 MG tablet Take 25 mg by mouth daily. 03/01/20  Yes [provider]  meloxicam (MOBIC) 7.5 MG tablet Take 7.5 mg by mouth 2 (two) times daily. 04/25/20  Yes [provider]  metFORMIN (GLUCOPHAGE) 1000 MG tablet Take 1,000 mg by mouth 2 (two) times daily. 03/04/20  Yes [provider]  acetaminophen (TYLENOL) 500 MG tablet Take 1,000 mg by mouth every 4 (four) hours as needed for pain.    [provider]  albuterol (VENTOLIN HFA) 108 (90 Base) MCG/ACT inhaler Inhale 2 puffs into the lungs every 4 (four) hours as needed for wheezing or shortness of breath. 06/06/20   Bonnielee Haff, MD  Baclofen 5 MG TABS Take 5 mg by mouth at bedtime.  04/27/20   [provider]  blood glucose meter kit and supplies KIT Dispense based on patient and insurance preference. Use up to four times daily as directed. (FOR ICD-9 250.00, 250.01). 06/06/20   Bonnielee Haff, MD  EMGALITY 120 MG/ML SOSY Inject 120 mg into the skin every 28 (twenty-eight) days. 04/12/20   [provider]  insulin glargine (LANTUS) 100 UNIT/ML Solostar Pen Inject 10 Units into the skin daily. 06/06/20   Bonnielee Haff, MD  Insulin Pen Needle (NOVOFINE) 30G X 8 MM MISC Inject 10 each into the skin as needed. 06/06/20   Bonnielee Haff, MD  interferon beta-1a (REBIF) 22 MCG/0.5ML injection Inject 22 mcg into the skin 3 (three) times a week. Monday Wednesday and Friday    [provider]  ketoconazole (NIZORAL) 2 % cream Apply to affected areas at face 1-2 times  daily as needed 11/08/21   Brendolyn Patty, MD  losartan (COZAAR) 50 MG tablet Take 50 mg by mouth daily.  03/01/20   [provider]  mometasone-formoterol (DULERA) 100-5 MCG/ACT AERO Inhale 2 puffs into the lungs 2 (two) times daily. 06/06/20   Bonnielee Haff, MD  ondansetron (ZOFRAN) 4 MG tablet Take 4 mg by mouth every 8 (eight) hours as needed. 05/23/20   [provider]  phenazopyridine (PYRIDIUM) 200 MG tablet Take 1 tablet (200 mg total) by mouth 3 (three) times daily as needed for pain. 07/25/22   Melynda Ripple, MD  SUMAtriptan (IMITREX) 100 MG tablet Take 100 mg by mouth  every 2 (two) hours as needed.  05/20/20   [provider]  TOPAMAX 100 MG tablet Take 100 mg by mouth 2 (two) times daily. 05/23/20   [provider]  sitaGLIPtin-metformin (JANUMET) 50-500 MG tablet Take 1 tablet by mouth 2 (two) times daily with a meal.  08/10/14 10/01/19  [provider]  traZODone (DESYREL) 50 MG tablet TAKE 1 TO 2 TABLETS BY MOUTH EVERY NIGHT FOR SLEEP 07/28/15 10/01/19  [provider]    Family History Family History  Problem Relation Age of Onset   Diabetes Mother    Breast cancer Neg Hx     Social History Social History   Tobacco Use   Smoking status: Never   Smokeless tobacco: Never  Vaping Use   Vaping Use: Never used  Substance Use Topics   Alcohol use: No   Drug use: Not Currently     Allergies   Patient has no known allergies.   Review of Systems Review of Systems  Constitutional:  Negative for chills, diaphoresis, fatigue and fever.  HENT:  Positive for congestion, rhinorrhea and sore throat. Negative for ear pain, sinus pressure and sinus pain.   Respiratory:  Negative for cough and shortness of breath.   Cardiovascular:  Negative for chest pain.  Gastrointestinal:  Negative for abdominal pain, diarrhea, nausea and vomiting.  Genitourinary:  Positive for dysuria. Negative for decreased urine volume, flank pain,  frequency, hematuria, pelvic pain, urgency, vaginal bleeding, vaginal discharge and vaginal pain.  Musculoskeletal:  Positive for back pain (chronic). Negative for arthralgias and myalgias.  Skin:  Negative for rash.  Neurological:  Negative for weakness and headaches.  Hematological:  Negative for adenopathy.     Physical Exam Triage Vital Signs ED Triage Vitals [08/21/22 0848]  Enc Vitals Group     BP      Pulse      Resp      Temp      Temp src      SpO2      Weight 220 lb 0.3 oz (99.8 kg)     Height 5' 8.5" (1.74 m)     Head Circumference      Peak Flow      Pain Score 9     Pain Loc      Pain Edu?      Excl. in [place]?    No data found.  Updated Vital Signs BP (!) 167/90 (BP Location: Left Arm)   Pulse 89   Temp 98.7 F (37.1 C) (Oral)   Resp 16   Ht 5' 8.5" (1.74 m)   Wt 220 lb 0.3 oz (99.8 kg)   SpO2 96%   BMI 32.97 kg/m    Physical Exam Vitals and nursing note reviewed.  Constitutional:      General: She is not in acute distress.    Appearance: Normal appearance. She is not ill-appearing or toxic-appearing.  HENT:     Head: Normocephalic and atraumatic.     Nose: Congestion present.     Mouth/Throat:     Mouth: Mucous membranes are moist.     Pharynx: Oropharynx is clear. Posterior oropharyngeal erythema present.  Eyes:     General: No scleral icterus.       Right eye: No discharge.        Left eye: No discharge.     Conjunctiva/sclera: Conjunctivae normal.  Cardiovascular:     Rate and Rhythm: Normal rate and regular rhythm.     Heart  sounds: Normal heart sounds.  Pulmonary:     Effort: Pulmonary effort is normal. No respiratory distress.     Breath sounds: Normal breath sounds.  Abdominal:     Palpations: Abdomen is soft.     Tenderness: There is no abdominal tenderness. There is no right CVA tenderness or left CVA tenderness.  Musculoskeletal:     Cervical back: Neck supple.  Skin:    General: Skin is dry.  Neurological:     General: No  focal deficit present.     Mental Status: She is alert. Mental status is at baseline.     Motor: No weakness.     Gait: Gait normal.  Psychiatric:        Mood and Affect: Mood normal.        Behavior: Behavior normal.        Thought Content: Thought content normal.      UC Treatments / Results  Labs (all labs ordered are listed, but only abnormal results are displayed) Labs Reviewed  WET PREP, GENITAL - Abnormal; Notable for the following components:      Result Value   WBC, Wet Prep HPF POC >10 (*)    All other components within normal limits  URINALYSIS, ROUTINE W REFLEX MICROSCOPIC - Abnormal; Notable for the following components:   Glucose, UA 500 (*)    Hgb urine dipstick TRACE (*)    All other components within normal limits  URINALYSIS, MICROSCOPIC (REFLEX) - Abnormal; Notable for the following components:   Bacteria, UA RARE (*)    All other components within normal limits  GROUP A STREP BY PCR  SARS CORONAVIRUS 2 BY RT PCR  URINE CULTURE    EKG   Radiology No results found.  Procedures Procedures (including critical care time)  Medications Ordered in UC Medications - No data to display  Initial Impression / Assessment and Plan / UC Course  I have reviewed the triage vital signs and the nursing notes.  Pertinent labs & imaging results that were available during my care of the patient were reviewed by me and considered in my medical decision making (see chart for details).   62 year old female presents for sore throat, nasal congestion and sneezing x 2 days.  No fever or cough.  Additionally reporting dysuria, vaginal itching x 4 days.  PCR strep negative.  Will obtain PCR COVID test. Negative COVID test.  Urinalysis without evidence of UTI.  Will obtain wet prep to assess for possible yeast infection as she has a history of this and is diabetic.  Patient elects to forego the pelvic exam for vaginal self swab.  Wet prep normal.  Discussed all results  with patient.  Suspect viral URI.  Supportive care encouraged with over-the-counter decongestants, rest, fluids, Chloraseptic spray, throat drops.  Reviewed return precautions.  Will await results of urine culture before treating for possible UTI.  Encouraged her to try AZO and increase her fluids at this time.  If symptoms worsen or are not improving, return.   Final Clinical Impressions(s) / UC Diagnoses   Final diagnoses:  Viral upper respiratory tract infection  Sore throat  Dysuria  Vaginal itching     Discharge Instructions      -Your strep test was negative and your COVID test was also negative. - The urine is not consistent with a UTI but I am going to send it for culture and if bacteria grows someone will contact you and send antibiotic. - You are negative for  yeast and BV. - At this time you may take OTC decongestants and use Chloraseptic spray, Tylenol, plenty rest and fluids.  Most colds get better in 1 to 2 weeks. - Increase your fluid intake and rest.  You may try AZO over-the-counter for burning sensation.  URI/COLD SYMPTOMS: Your exam today is consistent with a viral illness. Antibiotics are not indicated at this time. Use medications as directed, including cough syrup, nasal saline, and decongestants. Your symptoms should improve over the next few days and resolve within 7-10 days. Increase rest and fluids. F/u if symptoms worsen or predominate such as sore throat, ear pain, productive cough, shortness of breath, or if you develop high fevers or worsening fatigue over the next several days.       ED Prescriptions   None    PDMP not reviewed this encounter.   Danton Clap, PA-C 08/21/22 1026

## 2022-08-24 ENCOUNTER — Encounter (HOSPITAL_COMMUNITY): Payer: Self-pay | Admitting: Emergency Medicine

## 2022-08-24 ENCOUNTER — Telehealth (HOSPITAL_COMMUNITY): Payer: Self-pay | Admitting: Emergency Medicine

## 2022-08-24 LAB — URINE CULTURE: Culture: 20000 — AB

## 2022-08-24 MED ORDER — NITROFURANTOIN MONOHYD MACRO 100 MG PO CAPS: 100.0000 mg | ORAL_CAPSULE | Freq: Two times a day (BID) | ORAL | 0 refills | Status: DC

## 2023-03-07 ENCOUNTER — Other Ambulatory Visit: Payer: Self-pay | Admitting: Internal Medicine

## 2023-03-07 DIAGNOSIS — Z1231 Encounter for screening mammogram for malignant neoplasm of breast: Secondary | ICD-10-CM

## 2023-03-28 ENCOUNTER — Other Ambulatory Visit: Payer: Self-pay

## 2023-03-28 DIAGNOSIS — N390 Urinary tract infection, site not specified: Secondary | ICD-10-CM

## 2023-03-29 ENCOUNTER — Other Ambulatory Visit: Admission: RE | Admit: 2023-03-29 | Payer: Medicare Other | Source: Home / Self Care

## 2023-03-29 ENCOUNTER — Ambulatory Visit (INDEPENDENT_AMBULATORY_CARE_PROVIDER_SITE_OTHER): Payer: Medicare Other | Admitting: Urology

## 2023-03-29 VITALS — BP 127/77 | HR 93 | Ht 68.5 in | Wt 219.5 lb

## 2023-03-29 DIAGNOSIS — N390 Urinary tract infection, site not specified: Secondary | ICD-10-CM | POA: Insufficient documentation

## 2023-03-29 DIAGNOSIS — R339 Retention of urine, unspecified: Secondary | ICD-10-CM | POA: Diagnosis not present

## 2023-03-29 DIAGNOSIS — Z87440 Personal history of urinary (tract) infections: Secondary | ICD-10-CM | POA: Diagnosis not present

## 2023-03-29 LAB — URINALYSIS, COMPLETE (UACMP) WITH MICROSCOPIC
Bilirubin Urine: NEGATIVE
Glucose, UA: NEGATIVE mg/dL
Hgb urine dipstick: NEGATIVE
Ketones, ur: NEGATIVE mg/dL
Leukocytes,Ua: NEGATIVE
Nitrite: NEGATIVE
Protein, ur: NEGATIVE mg/dL
Specific Gravity, Urine: 1.025 (ref 1.005–1.030)
pH: 5.5 (ref 5.0–8.0)

## 2023-03-29 LAB — BLADDER SCAN AMB NON-IMAGING: Scan Result: 164

## 2023-03-29 MED ORDER — ESTRADIOL 0.1 MG/GM VA CREA: TOPICAL_CREAM | VAGINAL | 12 refills | Status: AC

## 2023-03-29 NOTE — Progress Notes (Signed)
Marcelle Overlie Plume,acting as a scribe for Vanna Scotland, MD.,have documented all relevant documentation on the behalf of Vanna Scotland, MD,as directed by  Vanna Scotland, MD while in the presence of Vanna Scotland, MD.  03/29/2023 11:41 AM   Gabriella Robinson 05-01-1961 [id number]  Referring provider: Enid Baas, MD [address] [place],  [place] [address]  Chief Complaint  Patient presents with   Establish Care   Recurrent UTI    HPI: 62 year-old female who is referred for further evaluation of recurrent urinary tract infections.   She appears to have been seen and evaluated multiple times for concerns for urinary tract infections. She has a positive urine culture from 07/2022, growing E. Coli. She grew enterococcus in 08/2022 and E. Coli in 09/2022, 12/2022, and 02/2023. She does not appear to have any recent upper tract imaging.   She is a diabetic and her most recent hemoglobin A1C is 5.8.   Today, she denies any vaginal bulging or pressure. She notes having diarrhea last week. She has a personal history of irritable bowel syndrome which is well controlled at this time.  She reports symptoms of bad odor during urination, back pain, and burning sensation. She also experiences urgency and frequency but denies seeing red urine. She is currently on Bactrim, which seems to be helping, as her urine today shows only a few white blood cells.  Status post hysterectomy for benign reasons.  No issues with vaginal bulging.  Occasional vaginal dryness.  Results for orders placed or performed in visit on 03/29/23  Bladder Scan (Post Void Residual) in office  Result Value Ref Range   Scan Result 164 ml   Results for orders placed or performed during the hospital encounter of 03/29/23  Urinalysis, Complete w Microscopic -  Result Value Ref Range   Color, Urine YELLOW YELLOW   APPearance CLEAR CLEAR   Specific Gravity, Urine 1.025 1.005 - 1.030   pH 5.5 5.0 - 8.0   Glucose, UA  NEGATIVE NEGATIVE mg/dL   Hgb urine dipstick NEGATIVE NEGATIVE   Bilirubin Urine NEGATIVE NEGATIVE   Ketones, ur NEGATIVE NEGATIVE mg/dL   Protein, ur NEGATIVE NEGATIVE mg/dL   Nitrite NEGATIVE NEGATIVE   Leukocytes,Ua NEGATIVE NEGATIVE   Squamous Epithelial / HPF 0-5 0 - 5 /HPF   WBC, UA 0-5 0 - 5 WBC/hpf   RBC / HPF 6-10 0 - 5 RBC/hpf   Bacteria, UA FEW (A) NONE SEEN   Budding Yeast PRESENT    Hyaline Casts, UA PRESENT    Ca Oxalate Crys, UA PRESENT      PMH: Past Medical History:  Diagnosis Date   Chronic back pain    Diabetes ([facility])    HBP (high blood pressure)    History of blood clots    Lumbar canal stenosis 11/12/2013   Migraines    MS (multiple sclerosis) ([facility])    Sinus complaint    Sleep apnea     Surgical History: Past Surgical History:  Procedure Laterality Date   ABDOMINAL HYSTERECTOMY     BACK SURGERY  12/06/2009   left L4-L5 micro decompression, partial facetectomy and foraminotomy   COLONOSCOPY WITH PROPOFOL N/A 11/21/2020   Procedure: COLONOSCOPY WITH PROPOFOL;  Surgeon: Regis Bill, MD;  Location: [facility] END[facility]COPY;  Service: Endoscopy;  Laterality: N/A;   ECTOPIC PREGNANCY SURGERY     EYE SURGERY Bilateral    FOOT SURGERY Bilateral    OOPHORECTOMY Left    pt has right ovary still  Home Medications:  Allergies as of 03/29/2023   No Known Allergies      Medication List        Accurate as of March 29, 2023 11:41 AM. If you have any questions, ask your nurse or doctor.          STOP taking these medications    albuterol 108 (90 Base) MCG/ACT inhaler Commonly known as: VENTOLIN HFA   atorvastatin 10 MG tablet Commonly known as: LIPITOR   blood glucose meter kit and supplies Kit   hydrochlorothiazide 25 MG tablet Commonly known as: HYDRODIURIL   Janumet 50-500 MG tablet Generic drug: sitaGLIPtin-metformin   mometasone-formoterol 100-5 MCG/ACT Aero Commonly known as: DULERA   nitrofurantoin (macrocrystal-monohydrate) 100  MG capsule Commonly known as: MACROBID   ondansetron 4 MG tablet Commonly known as: ZOFRAN   phenazopyridine 200 MG tablet Commonly known as: PYRIDIUM       TAKE these medications    acetaminophen 500 MG tablet Commonly known as: TYLENOL Take 1,000 mg by mouth every 4 (four) hours as needed for pain.   Baclofen 5 MG Tabs Take 5 mg by mouth at bedtime.   Emgality 120 MG/ML Sosy Generic drug: Galcanezumab-gnlm Inject 120 mg into the skin every 28 (twenty-eight) days.   estradiol 0.1 MG/GM vaginal cream Commonly known as: ESTRACE Estrogen Cream Instruction Discard applicator Apply pea sized amount to tip of finger to urethra before bed. Wash hands well after application. Use Monday, Wednesday and Friday   famotidine 20 MG tablet Commonly known as: PEPCID Take 1 tablet (20 mg total) by mouth daily.   gabapentin 800 MG tablet Commonly known as: NEURONTIN Take 800 mg by mouth 2 (two) times daily.   glipiZIDE 5 MG 24 hr tablet Commonly known as: GLUCOTROL XL Take 5 mg by mouth daily with breakfast.   glycopyrrolate 1 MG tablet Commonly known as: Robinul Take 1 tablet (1 mg total) by mouth 2 (two) times daily.   insulin glargine 100 UNIT/ML Solostar Pen Commonly known as: LANTUS Inject 10 Units into the skin daily.   Insulin Pen Needle 30G X 8 MM Misc Commonly known as: NOVOFINE Inject 10 each into the skin as needed.   interferon beta-1a 22 MCG/0.5ML injection Commonly known as: REBIF Inject 22 mcg into the skin 3 (three) times a week. Monday Wednesday and Friday   ketoconazole 2 % cream Commonly known as: NIZORAL Apply to affected areas at face 1-2 times daily as needed   losartan 50 MG tablet Commonly known as: COZAAR Take 50 mg by mouth daily.   meloxicam 7.5 MG tablet Commonly known as: MOBIC Take 7.5 mg by mouth 2 (two) times daily.   metFORMIN 1000 MG tablet Commonly known as: GLUCOPHAGE Take 1,000 mg by mouth 2 (two) times daily.   SUMAtriptan  100 MG tablet Commonly known as: IMITREX Take 100 mg by mouth every 2 (two) hours as needed.   Topamax 100 MG tablet Generic drug: topiramate Take 100 mg by mouth 2 (two) times daily.        Family History: Family History  Problem Relation Age of Onset   Diabetes Mother    Breast cancer Neg Hx     Social History:  reports that she has never smoked. She has never used smokeless tobacco. She reports that she does not currently use drugs. She reports that she does not drink alcohol.   Physical Exam: BP 127/77   Pulse 93   Ht 5' 8.5" (1.74 m)  Wt 219 lb 8 oz (99.6 kg)   BMI 32.89 kg/m   Constitutional:  Alert and oriented, No acute distress. HEENT: [place] AT, moist mucus membranes.  Trachea midline, no masses. Neurologic: Grossly intact, no focal deficits, moving all 4 extremities. Psychiatric: Normal mood and affect.   Assessment & Plan:    1. Recurrent UTI -Recurrent UTI Prevention Strategies  Stay well hydrated. Get a moderate amount of exercise. Eat a diet rich in fruit and vegetables. Start a bowel regimen to manage your constipation. Your goal is to have consistent, formed bowel movements that are easy for you to pass. You may use either of the over-the-counter supplements Benefiber or Miralax to help with this. I recommend that you try Benefiber first and move on to Miralax if this is not helping you enough. You may adjust the recommended dose of Miralax (one capful daily) to achieve this goal. Start taking an over-the-counter cranberry supplement for urinary tract health. Take this once or twice daily on an empty stomach, e.g. right before bed. Start taking an over-the-counter d-mannose supplement. Take this daily per packaging instructions. Start taking an over-the-counter probiotic containing the bacterial species called Lactobacillus. Take this daily. Start vaginal estrogen cream. Apply a pea-sized amount around the opening of the urethra every day for 2 weeks, then  three times weekly forever.  - Plan for cystoscopy and renal ultrasound  2. Incomplete bladder emptying - PVR today is 164 mL - Encourage leaning forward and Crede maneuver to assist with complete bladder emptying - Pelvic exam at time of cystoscopy  Return in about 4 weeks (around 04/26/2023) for cysto/pelvic .  I have reviewed the above documentation for accuracy and completeness, and I agree with the above.   Vanna Scotland, MD   [facility] [address], [address] [place], [place] [address] [phone]

## 2023-03-29 NOTE — Patient Instructions (Addendum)
For UTI prevention take cranberry tablets, probiotic, D-Mannose daily.  

## 2023-04-15 ENCOUNTER — Ambulatory Visit
Admission: EM | Admit: 2023-04-15 | Discharge: 2023-04-15 | Disposition: A | Discharge status: Home / Self Care | Payer: Medicare Other | Attending: Physician Assistant | Admitting: Physician Assistant

## 2023-04-15 ENCOUNTER — Encounter: Payer: Self-pay | Admitting: Emergency Medicine

## 2023-04-15 DIAGNOSIS — R350 Frequency of micturition: Secondary | ICD-10-CM | POA: Diagnosis present

## 2023-04-15 DIAGNOSIS — R21 Rash and other nonspecific skin eruption: Secondary | ICD-10-CM | POA: Insufficient documentation

## 2023-04-15 LAB — URINALYSIS, W/ REFLEX TO CULTURE (INFECTION SUSPECTED)
Bilirubin Urine: NEGATIVE
Glucose, UA: NEGATIVE mg/dL
Hgb urine dipstick: NEGATIVE
Ketones, ur: NEGATIVE mg/dL
Leukocytes,Ua: NEGATIVE
Nitrite: NEGATIVE
Protein, ur: NEGATIVE mg/dL
Specific Gravity, Urine: 1.02 (ref 1.005–1.030)
pH: 6 (ref 5.0–8.0)

## 2023-04-15 LAB — WET PREP, GENITAL
Clue Cells Wet Prep HPF POC: NONE SEEN
Sperm: NONE SEEN
Trich, Wet Prep: NONE SEEN
WBC, Wet Prep HPF POC: <10 — AB (ref <10)
Yeast Wet Prep HPF POC: NONE SEEN

## 2023-04-15 MED ORDER — MUPIROCIN 2 % EX OINT: 1.0000 | TOPICAL_OINTMENT | Freq: Two times a day (BID) | CUTANEOUS | 0 refills | Status: AC

## 2023-04-15 NOTE — ED Triage Notes (Signed)
Pt presents with lower back pain, urinary frequency and urinary pressure x 2 weeks. Pt also states she has a rash on her bottom.

## 2023-04-15 NOTE — ED Provider Notes (Signed)
[facility]    CSN: [id number] Arrival date & time: 04/15/23  0815      History   Chief Complaint Chief Complaint  Patient presents with   Urinary Frequency   Back Pain    HPI Gabriella Robinson is a 62 y.o. female with a history of recurrent UTIs.  Patient presenting today for 2-week history of urinary frequency and bladder pressure along with lower back pain.  Also reporting rash on buttocks that is itchy, not painful. No history of herpes. Has been on Macrobid x 3-4 days without relief.  Seen by urology on 03/29/2023 for recurrent UTIs.  Taken from urologist HPI:   "She appears to have been seen and evaluated multiple times for concerns for urinary tract infections. She has a positive urine culture from 07/2022, growing E. Coli. She grew enterococcus in 08/2022 and E. Coli in 09/2022, 12/2022, and 02/2023. She does not appear to have any recent upper tract imaging. She is a diabetic and her most recent hemoglobin A1C is 5.8. She reports symptoms of bad odor during urination, back pain, and burning sensation. She also experiences urgency and frequency but denies seeing red urine. Status post hysterectomy for benign reasons.  No issues with vaginal bulging.  Occasional vaginal dryness."  Next follow-up with urology is on 04/26/2023.  HPI  Past Medical History:  Diagnosis Date   Chronic back pain    Diabetes (HCC)    HBP (high blood pressure)    History of blood clots    Lumbar canal stenosis 11/12/2013   Migraines    MS (multiple sclerosis) (HCC)    Sinus complaint    Sleep apnea     Patient Active Problem List   Diagnosis Date Noted   Pneumonia due to COVID-19 virus 05/25/2020   HTN (hypertension) 05/25/2020   HLD (hyperlipidemia) 05/25/2020   Diabetes mellitus without complication (HCC) 05/25/2020   Hyperosmolar hyperglycemic state (HHS) (HCC) 05/25/2020   Hypokalemia 05/25/2020   AKI (acute kidney injury) (HCC) 05/25/2020   Acute respiratory failure with  hypoxia (HCC) 05/25/2020   Severe sepsis (HCC) 05/25/2020   Chronic sacroiliac joint pain (Left) 09/20/2015   Lumbar spondylosis 09/20/2015   Encounter for therapeutic drug level monitoring 09/19/2015   Avitaminosis D 09/19/2015   Lumbar facet syndrome (Location of Secondary source of pain) (Bilateral) (L>R) 09/19/2015   Neurogenic pain 08/17/2015   Fibromyalgia 08/17/2015   Nocturnal muscle cramps (Bilateral lower extremities) 08/17/2015   Chronic pain 07/21/2015   Long term current use of opiate analgesic 07/21/2015   Long term prescription opiate use 07/21/2015   Opiate use 07/21/2015   Opioid dependence, daily use (HCC) 07/21/2015   Uncomplicated opioid dependence (HCC) 07/21/2015   Adiposity 07/21/2015   Chronic neck pain (Location of Primary Source of Pain) (Right) 07/21/2015   Chronic cervical radicular pain (Right) (C7/C8 Dermatome) 07/21/2015   Chronic low back pain (Location of Secondary source of pain) (Left) 07/21/2015   Neuropathic pain 07/21/2015   Musculoskeletal pain 07/21/2015   Myofascial pain 07/21/2015   Diffuse myofascial pain syndrome 07/21/2015   Restless leg syndrome 07/21/2015   Carpal tunnel syndrome 06/09/2015   Migraine without aura and responsive to treatment 04/21/2015   Multiple sclerosis (HCC) 10/04/2014   Hand paresthesia 10/04/2014   Disordered sleep 10/04/2014   Enthesopathy of hip (Left) 11/12/2013   Neuropathy 11/12/2013    Past Surgical History:  Procedure Laterality Date   ABDOMINAL HYSTERECTOMY     BACK SURGERY  12/06/2009   left  L4-L5 micro decompression, partial facetectomy and foraminotomy   COLONOSCOPY WITH PROPOFOL N/A 11/21/2020   Procedure: COLONOSCOPY WITH PROPOFOL;  Surgeon: Regis Bill, MD;  Location: [facility];  Service: Endoscopy;  Laterality: N/A;   ECTOPIC PREGNANCY SURGERY     EYE SURGERY Bilateral    FOOT SURGERY Bilateral    OOPHORECTOMY Left    pt has right ovary still    OB History     Gravida  4    Para      Term      Preterm      AB  2   Living  2      SAB  1   IAB      Ectopic  1   Multiple      Live Births               Home Medications    Prior to Admission medications   Medication Sig Start Date End Date Taking? Authorizing Provider  mupirocin ointment (BACTROBAN) 2 % Apply 1 Application topically 2 (two) times daily. 04/15/23  Yes Shirlee Latch, PA-C  acetaminophen (TYLENOL) 500 MG tablet Take 1,000 mg by mouth every 4 (four) hours as needed for pain.    [provider]  Baclofen 5 MG TABS Take 5 mg by mouth at bedtime.  04/27/20   [provider]  EMGALITY 120 MG/ML SOSY Inject 120 mg into the skin every 28 (twenty-eight) days. 04/12/20   [provider]  estradiol (ESTRACE) 0.1 MG/GM vaginal cream Estrogen Cream Instruction Discard applicator Apply pea sized amount to tip of finger to urethra before bed. Wash hands well after application. Use Monday, Wednesday and Friday 03/29/23   Vanna Scotland, MD  famotidine (PEPCID) 20 MG tablet Take 1 tablet (20 mg total) by mouth daily. 06/07/20   Osvaldo Shipper, MD  gabapentin (NEURONTIN) 800 MG tablet Take 800 mg by mouth 2 (two) times daily. 04/30/20   [provider]  glipiZIDE (GLUCOTROL XL) 5 MG 24 hr tablet Take 5 mg by mouth daily with breakfast.  07/16/15   [provider]  glycopyrrolate (ROBINUL) 1 MG tablet Take 1 tablet (1 mg total) by mouth 2 (two) times daily. 11/08/21   Willeen Niece, MD  insulin glargine (LANTUS) 100 UNIT/ML Solostar Pen Inject 10 Units into the skin daily. 06/06/20   Osvaldo Shipper, MD  Insulin Pen Needle (NOVOFINE) 30G X 8 MM MISC Inject 10 each into the skin as needed. 06/06/20   Osvaldo Shipper, MD  interferon beta-1a (REBIF) 22 MCG/0.5ML injection Inject 22 mcg into the skin 3 (three) times a week. Monday Wednesday and Friday    [provider]  ketoconazole (NIZORAL) 2 % cream Apply to affected areas at face 1-2 times daily as  needed 11/08/21   Willeen Niece, MD  losartan (COZAAR) 50 MG tablet Take 50 mg by mouth daily.  03/01/20   [provider]  meloxicam (MOBIC) 7.5 MG tablet Take 7.5 mg by mouth 2 (two) times daily. 04/25/20   [provider]  metFORMIN (GLUCOPHAGE) 1000 MG tablet Take 1,000 mg by mouth 2 (two) times daily. 03/04/20   [provider]  SUMAtriptan (IMITREX) 100 MG tablet Take 100 mg by mouth every 2 (two) hours as needed.  05/20/20   [provider]  TOPAMAX 100 MG tablet Take 100 mg by mouth 2 (two) times daily. 05/23/20   [provider]  traZODone (DESYREL) 50 MG tablet TAKE 1 TO 2 TABLETS  BY MOUTH EVERY NIGHT FOR SLEEP 07/28/15 10/01/19  [provider]    Family History Family History  Problem Relation Age of Onset   Diabetes Mother    Breast cancer Neg Hx     Social History Social History   Tobacco Use   Smoking status: Never   Smokeless tobacco: Never  Vaping Use   Vaping status: Never Used  Substance Use Topics   Alcohol use: No   Drug use: Not Currently     Allergies   Patient has no known allergies.   Review of Systems Review of Systems  Constitutional:  Negative for chills, fatigue and fever.  Gastrointestinal:  Negative for abdominal pain, diarrhea, nausea and vomiting.  Genitourinary:  Positive for frequency and urgency. Negative for decreased urine volume, dysuria, flank pain, hematuria, pelvic pain, vaginal bleeding, vaginal discharge and vaginal pain.  Musculoskeletal:  Negative for back pain.  Skin:  Positive for rash.     Physical Exam Triage Vital Signs ED Triage Vitals  Encounter Vitals Group     BP      Systolic BP Percentile      Diastolic BP Percentile      Pulse      Resp      Temp      Temp src      SpO2      Weight      Height      Head Circumference      Peak Flow      Pain Score      Pain Loc      Pain Education      Exclude from Growth Chart    No data found.  Updated Vital  Signs BP 133/86 (BP Location: Left Arm)   Pulse 90   Temp 99.1 F (37.3 C) (Oral)   Resp 16   SpO2 97%   Physical Exam Vitals and nursing note reviewed.  Constitutional:      General: She is not in acute distress.    Appearance: Normal appearance. She is not ill-appearing or toxic-appearing.  HENT:     Head: Normocephalic and atraumatic.  Eyes:     General: No scleral icterus.       Right eye: No discharge.        Left eye: No discharge.     Conjunctiva/sclera: Conjunctivae normal.  Cardiovascular:     Rate and Rhythm: Normal rate and regular rhythm.     Heart sounds: Normal heart sounds.  Pulmonary:     Effort: Pulmonary effort is normal. No respiratory distress.     Breath sounds: Normal breath sounds.  Abdominal:     Palpations: Abdomen is soft.     Tenderness: There is no abdominal tenderness. There is no right CVA tenderness or left CVA tenderness.  Musculoskeletal:     Cervical back: Neck supple.  Skin:    General: Skin is dry.     Findings: Rash (Cluster of multiple tiny pustules central superior gluteal cleft. No erythema or fluctuance. Mildly TTP) present.  Neurological:     General: No focal deficit present.     Mental Status: She is alert. Mental status is at baseline.     Motor: No weakness.     Gait: Gait normal.  Psychiatric:        Mood and Affect: Mood normal.        Behavior: Behavior normal.        Thought Content: Thought content normal.  UC Treatments / Results  Labs (all labs ordered are listed, but only abnormal results are displayed) Labs Reviewed  WET PREP, GENITAL - Abnormal; Notable for the following components:      Result Value   WBC, Wet Prep HPF POC <10 (*)    All other components within normal limits  URINALYSIS, W/ REFLEX TO CULTURE (INFECTION SUSPECTED) - Abnormal; Notable for the following components:   Bacteria, UA RARE (*)    All other components within normal limits  URINE CULTURE    EKG   Radiology No results  found.  Procedures Procedures (including critical care time)  Medications Ordered in UC Medications - No data to display  Initial Impression / Assessment and Plan / UC Course  I have reviewed the triage vital signs and the nursing notes.  Pertinent labs & imaging results that were available during my care of the patient were reviewed by me and considered in my medical decision making (see chart for details).   62 year old female with history of recurrent UTIs and vaginal dryness presents for 2-week history of urinary frequency, bladder pressure.  Also reports rash on buttocks.  Seen by urology on 03/29/2023 and has follow-up on 04/26/2023.  Has been on Macrobid for the past 3 to 4 days without relief.  Vitals are currently normal and stable.  There is a small cluster of multiple tiny pustules of the upper gluteal cleft which is a little tender to palpation.  Suspicious for bacterial skin infection versus herpes.  She declines herpes testing.  Urinalysis shows only rare bacteria.  Will obtain a wet prep.  Wet prep.  Will send urine for culture and treat for UTI if the culture is positive only.  At this time she is to continue the Macrobid.  Treating potential skin infection at this time with mupirocin ointment.  Advised to keep follow-up appointment with urology.  Encouraged increasing rest and fluids.  Advised to contact our office if any symptoms acutely worsen.   Final Clinical Impressions(s) / UC Diagnoses   Final diagnoses:  Urinary frequency  Rash and nonspecific skin eruption     Discharge Instructions      -Urine test today is normal.  I am going to send for culture and we will contact you and start you on antibiotics based on the culture result. -Negative vaginal swab.  No vaginal infection. - Increase your rest and fluids. - I sent a ointment for the rash on your upper buttocks.  If you develop other lesions in your genitourinary area, on buttocks, etc. please return for  reevaluation and workup. - Keep your follow-up appointment with your urologist.     ED Prescriptions     Medication Sig Dispense Auth. Provider   mupirocin ointment (BACTROBAN) 2 % Apply 1 Application topically 2 (two) times daily. 22 g Shirlee Latch, PA-C      PDMP not reviewed this encounter.   Eusebio Friendly B, PA-C 04/15/23 [phone]

## 2023-04-15 NOTE — Discharge Instructions (Addendum)
-  Urine test today is normal.  I am going to send for culture and we will contact you and start you on antibiotics based on the culture result. -Negative vaginal swab.  No vaginal infection. - Increase your rest and fluids. - I sent a ointment for the rash on your upper buttocks.  If you develop other lesions in your genitourinary area, on buttocks, etc. please return for reevaluation and workup. - Keep your follow-up appointment with your urologist.

## 2023-04-16 LAB — URINE CULTURE: Culture: NO GROWTH

## 2023-04-25 ENCOUNTER — Ambulatory Visit
Admission: RE | Admit: 2023-04-25 | Discharge: 2023-04-25 | Disposition: A | Discharge status: Home / Self Care | Payer: Medicare Other | Source: Ambulatory Visit | Attending: Urology | Admitting: Urology

## 2023-04-25 DIAGNOSIS — N390 Urinary tract infection, site not specified: Secondary | ICD-10-CM | POA: Diagnosis present

## 2023-04-26 ENCOUNTER — Other Ambulatory Visit
Admission: RE | Admit: 2023-04-26 | Discharge: 2023-04-26 | Disposition: A | Discharge status: Home / Self Care | Payer: Medicare Other | Attending: Urology | Admitting: Urology

## 2023-04-26 ENCOUNTER — Ambulatory Visit (INDEPENDENT_AMBULATORY_CARE_PROVIDER_SITE_OTHER): Payer: Medicare Other | Admitting: Urology

## 2023-04-26 ENCOUNTER — Other Ambulatory Visit: Payer: Self-pay

## 2023-04-26 VITALS — BP 135/84 | HR 74 | Ht 68.5 in | Wt 221.1 lb

## 2023-04-26 DIAGNOSIS — N390 Urinary tract infection, site not specified: Secondary | ICD-10-CM

## 2023-04-26 DIAGNOSIS — Z87440 Personal history of urinary (tract) infections: Secondary | ICD-10-CM

## 2023-04-26 DIAGNOSIS — R339 Retention of urine, unspecified: Secondary | ICD-10-CM | POA: Diagnosis present

## 2023-04-26 DIAGNOSIS — N1330 Unspecified hydronephrosis: Secondary | ICD-10-CM

## 2023-04-26 LAB — URINALYSIS, COMPLETE (UACMP) WITH MICROSCOPIC
Bilirubin Urine: NEGATIVE
Glucose, UA: NEGATIVE mg/dL
Hgb urine dipstick: NEGATIVE
Ketones, ur: NEGATIVE mg/dL
Leukocytes,Ua: NEGATIVE
Nitrite: NEGATIVE
Protein, ur: NEGATIVE mg/dL
Specific Gravity, Urine: 1.025 (ref 1.005–1.030)
Squamous Epithelial / HPF: NONE SEEN /HPF (ref 0–5)
pH: 6 (ref 5.0–8.0)

## 2023-04-26 NOTE — Progress Notes (Signed)
   04/26/23  CC:  Chief Complaint  Patient presents with   Cysto    HPI: 62 year old female with a personal history of recurrent UTIs and incomplete bladder emptying who presents today for cystoscopy.  In the interim, she did have a renal ultrasound that indicated bilateral hydronephrosis, mild along with PVR of 235.  She reports that she has been using her estrogen cream daily.  She is asymptomatic today.  Her urinalysis is negative.  Blood pressure 135/84, pulse 74, height 5' 8.5" (1.74 m), weight 221 lb 2 oz (100.3 kg). NED. A&Ox3.   No respiratory distress   Abd soft, NT, ND Normal external genitalia with patent urethral meatus  Pelvic exam chaperoned by CMA, [place].  She does have fairly good vaginal vault support, mild stage I cystocele, minimal apical descent and good posterior support.  Cystoscopy Procedure Note  Patient identification was confirmed, informed consent was obtained, and patient was prepped using Betadine solution.  Lidocaine jelly was administered per urethral meatus.    Procedure: - Flexible cystoscope introduced, without any difficulty.   - Thorough search of the bladder revealed:    normal urethral meatus    normal urothelium    no stones    no ulcers     no tumors    no urethral polyps    no trabeculation  - Ureteral orifices were normal in position and appearance.  Post-Procedure: - Patient tolerated the procedure well  Assessment/ Plan:  1. Recurrent UTI Likely secondary to incomplete bladder emptying  We again discussed today strategies of complete bladder emptying which could include coud, double voiding technique or introduction of self cath daily.  She is adamantly against this at least for the time being.  Continue topical estrogen cream and monitor symptoms.  2. Incomplete bladder emptying As above, if she continues to have recurrent symptoms or develops renal dysfunction/recurrent UTIs, would strongly recommend self  cath otherwise as above  3. Bilateral hydronephrosis Likely secondary to incomplete emptying.  Her renal function is overall preserved, creatinine 0.6 as of 02/2023  See above discussion    Return in about 3 months (around 07/26/2023) for follow up w/ PVR .  Vanna Scotland, MD

## 2023-05-13 ENCOUNTER — Other Ambulatory Visit: Payer: Self-pay | Admitting: Urology

## 2023-05-13 ENCOUNTER — Encounter: Payer: Self-pay | Admitting: Urology

## 2023-05-13 ENCOUNTER — Other Ambulatory Visit: Payer: Self-pay

## 2023-05-13 ENCOUNTER — Other Ambulatory Visit
Admission: RE | Admit: 2023-05-13 | Discharge: 2023-05-13 | Disposition: A | Discharge status: Home / Self Care | Payer: Medicare Other | Attending: Advanced Practice Midwife | Admitting: Advanced Practice Midwife

## 2023-05-13 ENCOUNTER — Ambulatory Visit (INDEPENDENT_AMBULATORY_CARE_PROVIDER_SITE_OTHER): Payer: Medicare Other | Admitting: Urology

## 2023-05-13 VITALS — BP 145/80 | HR 92 | Temp 98.2°F | Ht 68.5 in | Wt 219.0 lb

## 2023-05-13 DIAGNOSIS — R82998 Other abnormal findings in urine: Secondary | ICD-10-CM

## 2023-05-13 DIAGNOSIS — R339 Retention of urine, unspecified: Secondary | ICD-10-CM | POA: Diagnosis not present

## 2023-05-13 DIAGNOSIS — Z87440 Personal history of urinary (tract) infections: Secondary | ICD-10-CM

## 2023-05-13 DIAGNOSIS — R300 Dysuria: Secondary | ICD-10-CM | POA: Diagnosis not present

## 2023-05-13 DIAGNOSIS — N390 Urinary tract infection, site not specified: Secondary | ICD-10-CM

## 2023-05-13 LAB — URINALYSIS, COMPLETE (UACMP) WITH MICROSCOPIC
Bilirubin Urine: NEGATIVE
Glucose, UA: NEGATIVE mg/dL
Hgb urine dipstick: NEGATIVE
Ketones, ur: NEGATIVE mg/dL
Nitrite: NEGATIVE
Protein, ur: NEGATIVE mg/dL
Specific Gravity, Urine: 1.015 (ref 1.005–1.030)
pH: 6 (ref 5.0–8.0)

## 2023-05-13 LAB — BLADDER SCAN AMB NON-IMAGING

## 2023-05-13 NOTE — Progress Notes (Signed)
05/13/2023 9:31 AM   [name] 1960-08-19 [id number]  Referring provider: Enid Baas, MD [address] [place],  [place] [address]  Chief Complaint  Patient presents with   Recurrent UTI   HPI: Gabriella Robinson is a 62 y.o. female who presents today for an acute visit.  Previous records reviewed.   She is initially seen by Dr. Apolinar Junes in August 2024 for further evaluation of recurrent urinary tract infections.  For her workup, she had a renal ultrasound which indicated bilateral hydronephrosis which was mild along with a PVR of 235 cc.  She was started on estrogen cream daily.  She is found to have a grade 1 cystocele.  They did discuss that her incomplete bladder emptying may be contributing to her recurrent UTIs.  They discussed CIC, but she adamantly deferred.   Her serum creatinine was 0.6 in July 2024.   Since Thursday, she has been hurting when she urinates.  She has a foul odor to her urine.  She has lower back pain.  She states she has been taking cranberry pills and applying the vaginal estrogen cream since she saw Dr. Apolinar Junes in August 2024.   Patient denies any modifying or aggravating factors.  Patient denies any recent UTI's, gross hematuria, dysuria or suprapubic/flank pain.  Patient denies any fevers, chills, nausea or vomiting.  PVR demonstrates adequate bladder emptying.   Urinalysis is underwhelming for UTI.    PMH: Past Medical History:  Diagnosis Date   Chronic back pain    Diabetes (HCC)    HBP (high blood pressure)    History of blood clots    Lumbar canal stenosis 11/12/2013   Migraines    MS (multiple sclerosis) (HCC)    Sinus complaint    Sleep apnea     Surgical History: Past Surgical History:  Procedure Laterality Date   ABDOMINAL HYSTERECTOMY     BACK SURGERY  12/06/2009   left L4-L5 micro decompression, partial facetectomy and foraminotomy   COLONOSCOPY WITH PROPOFOL N/A 11/21/2020   Procedure: COLONOSCOPY WITH PROPOFOL;   Surgeon: Regis Bill, MD;  Location: [facility];  Service: Endoscopy;  Laterality: N/A;   ECTOPIC PREGNANCY SURGERY     EYE SURGERY Bilateral    FOOT SURGERY Bilateral    OOPHORECTOMY Left    pt has right ovary still    Home Medications:  Allergies as of 05/13/2023   No Known Allergies      Medication List        Accurate as of May 13, 2023  9:31 AM. If you have any questions, ask your nurse or doctor.          acetaminophen 500 MG tablet Commonly known as: TYLENOL Take 1,000 mg by mouth every 4 (four) hours as needed for pain.   baclofen 10 MG tablet Commonly known as: LIORESAL Take 10 mg by mouth 3 (three) times daily. What changed: Another medication with the same name was removed. Continue taking this medication, and follow the directions you see here. Changed by: Michiel Cowboy   estradiol 0.1 MG/GM vaginal cream Commonly known as: ESTRACE Estrogen Cream Instruction Discard applicator Apply pea sized amount to tip of finger to urethra before bed. Wash hands well after application. Use Monday, Wednesday and Friday   famotidine 20 MG tablet Commonly known as: PEPCID Take 1 tablet (20 mg total) by mouth daily.   gabapentin 800 MG tablet Commonly known as: NEURONTIN Take 800 mg by mouth 2 (two) times  daily.   glipiZIDE 10 MG 24 hr tablet Commonly known as: GLUCOTROL XL Take 10 mg by mouth daily. What changed: Another medication with the same name was removed. Continue taking this medication, and follow the directions you see here. Changed by: Carollee Herter Harriett Azar   insulin glargine 100 UNIT/ML Solostar Pen Commonly known as: LANTUS Inject 10 Units into the skin daily.   interferon beta-1a 22 MCG/0.5ML injection Commonly known as: REBIF Inject 22 mcg into the skin 3 (three) times a week. Monday Wednesday and Friday   Linzess 145 MCG Caps capsule Generic drug: linaclotide Take 145 mcg by mouth daily.   losartan 50 MG tablet Commonly known as:  COZAAR Take 50 mg by mouth daily.   metFORMIN 1000 MG tablet Commonly known as: GLUCOPHAGE Take 1,000 mg by mouth 2 (two) times daily.   mupirocin ointment 2 % Commonly known as: BACTROBAN Apply 1 Application topically 2 (two) times daily.   Topamax 100 MG tablet Generic drug: topiramate Take 100 mg by mouth 2 (two) times daily.   traZODone 100 MG tablet Commonly known as: DESYREL Take 1 tablet by mouth at bedtime.        Allergies: No Known Allergies  Family History: Family History  Problem Relation Age of Onset   Diabetes Mother    Breast cancer Neg Hx     Social History:  reports that she has never smoked. She has never used smokeless tobacco. She reports that she does not currently use drugs. She reports that she does not drink alcohol.  ROS: Pertinent ROS in HPI  Physical Exam: BP (!) 145/80 (BP Location: Left Arm, Patient Position: Sitting, Cuff Size: Large)   Pulse 92   Temp 98.2 F (36.8 C) (Oral)   Ht 5' 8.5" (1.74 m)   Wt 219 lb (99.3 kg)   BMI 32.81 kg/m   Constitutional:  Well nourished. Alert and oriented, No acute distress. HEENT:  AT, moist mucus membranes.  Trachea midline Cardiovascular: No clubbing, cyanosis, or edema. Respiratory: Normal respiratory effort, no increased work of breathing. Neurologic: Grossly intact, no focal deficits, moving all 4 extremities. Psychiatric: Normal mood and affect.  Laboratory Data: Urinalysis: Component     Latest Ref Rng 05/13/2023  Color, Urine     YELLOW  YELLOW   Appearance     CLEAR  HAZY !   Specific Gravity, Urine     1.005 - 1.030  1.015   pH     5.0 - 8.0  6.0   Glucose, UA     NEGATIVE mg/dL NEGATIVE   Hgb urine dipstick     NEGATIVE  NEGATIVE   Bilirubin Urine     NEGATIVE  NEGATIVE   Ketones, ur     NEGATIVE mg/dL NEGATIVE   Protein     NEGATIVE mg/dL NEGATIVE   Nitrite     NEGATIVE  NEGATIVE   Squamous Epithelial / HPF     0 - 5 /HPF 0-5   WBC, UA     0 - 5 WBC/hpf 6-10    RBC / HPF     0 - 5 RBC/hpf 0-5   Bacteria, UA     NONE SEEN  FEW !   Leukocytes,Ua     NEGATIVE  TRACE !   WBC Clumps PRESENT     Legend: ! Abnormal I have reviewed the labs.   Pertinent Imaging:  05/13/23 09:09  Scan Result   Assessment & Plan:    1. Incomplete bladder emptying -  Bladder Scan (Post Void Residual) in office - PVR demonstrates adequate bladder emptying - She will continue double voiding technique   2. Recurrent UTI - UA nonspecific - Urine culture pending - Since her symptoms are mild at this time, I will not prescribe an antibiotic, but I will send the urine for culture -I explained that with her history of recurrent UTIs this would be the best plan as we do not want to expose her to antibiotics unnecessarily to put her at risk for forming antibiotic resistant organisms, she is agreeable -If her urine culture is positive she would like her antibiotics sent to [facility] on [address] [place]. and [place]   Return for keep scheduled follow up in [place] .  These notes generated with voice recognition software. I apologize for typographical errors.  Cloretta Ned  [facility] [address]  [address] [place], [place] [address] [phone]

## 2023-05-14 LAB — URINE CULTURE: Culture: <10000 — AB

## 2023-06-05 ENCOUNTER — Ambulatory Visit
Admission: RE | Admit: 2023-06-05 | Discharge: 2023-06-05 | Disposition: A | Discharge status: Home / Self Care | Payer: Medicare Other | Source: Ambulatory Visit | Attending: Internal Medicine | Admitting: Internal Medicine

## 2023-06-05 DIAGNOSIS — Z1231 Encounter for screening mammogram for malignant neoplasm of breast: Secondary | ICD-10-CM | POA: Diagnosis present

## 2023-08-04 NOTE — Progress Notes (Incomplete)
08/12/2023 11:33 PM   Gabriella Robinson 1961-07-23 [id number]  Referring provider: Enid Baas, MD [address] [place],  [place] [address]  Urological history: 1. rUTI's -contributing factors of age, GSM, DM, pelvic prolapse and incomplete bladder emptying -RUS (04/2023) mild bilaterl hydronephrosisi -cysto (04/2023) - NED  -documented positive urine cultures over the last year  January 16th, 2024 enterococcus faecalis  February 6th, 2024 E.coli   March 16th, 2024 E.coli  March 25th, 2024 MUF  May 13th, 2024 E.coli  June 18th, 2024 MUF  March 05, 2023 E.coli  September 9th, 2024 No growth  October 7th, 2024 <10,000 colonies  October 31st, 2024 No growth -vaginal estrogen cream three nights weekly  2. Incomplete bladder emptying -contributing factors of MS, DM and pelvic prolapse -Crede maneuver  3. Bilateral hydronephrosis -RUS (04/2023) - mild bilateral hydronephrosis -serum creatinine (06/2023) 0.57  4. Nephrolithiasis -contrast CT (12/2022) -punctate non obstructing renal stone in left lower pole  -RUS (04/2023) -bilateral nephrolithiasis  No chief complaint on file.  HPI: Gabriella Robinson is a 62 y.o. female who presents today for follow up.     Previous records reviewed.   At her visit on 05/13/2023, she is initially seen by Dr. Apolinar Junes in August 2024 for further evaluation of recurrent urinary tract infections.  For her workup, she had a renal ultrasound which indicated bilateral hydronephrosis which was mild along with a PVR of 235 cc.  She was started on estrogen cream daily.  She is found to have a grade 1 cystocele.  They did discuss that her incomplete bladder emptying may be contributing to her recurrent UTIs.  They discussed CIC, but she adamantly deferred.   Her serum creatinine was 0.6 in July 2024.  Since Thursday, she has been hurting when she urinates.  She has a foul odor to her urine.  She has lower back pain.  She states she has been  taking cranberry pills and applying the vaginal estrogen cream since she saw Dr. Apolinar Junes in August 2024.   Patient denies any modifying or aggravating factors.  Patient denies any recent UTI's, gross hematuria, dysuria or suprapubic/flank pain.  Patient denies any fevers, chills, nausea or vomiting.  PVR demonstrates adequate bladder emptying.   Urinalysis is underwhelming for UTI.   It was explained that since her urinalysis was not suspicious for an UTI, we would wait on culture results before prescribing antibiotics.   Urine culture <10,000 colonies.   She was not prescribed an antibiotic and instructed to keep follow up in January.  She then contacted her PCP on 06/06/2023 for dysuria.   UA was positive for 7 RBC's.  Urine culture was negative.    UA ***  PVR ***    PMH: Past Medical History:  Diagnosis Date   Chronic back pain    Diabetes (HCC)    HBP (high blood pressure)    History of blood clots    Lumbar canal stenosis 11/12/2013   Migraines    MS (multiple sclerosis) (HCC)    Sinus complaint    Sleep apnea     Surgical History: Past Surgical History:  Procedure Laterality Date   ABDOMINAL HYSTERECTOMY     BACK SURGERY  12/06/2009   left L4-L5 micro decompression, partial facetectomy and foraminotomy   COLONOSCOPY WITH PROPOFOL N/A 11/21/2020   Procedure: COLONOSCOPY WITH PROPOFOL;  Surgeon: Regis Bill, MD;  Location: [facility] ENDOSCOPY;  Service: Endoscopy;  Laterality: N/A;   ECTOPIC PREGNANCY SURGERY  EYE SURGERY Bilateral    FOOT SURGERY Bilateral    OOPHORECTOMY Left    pt has right ovary still    Home Medications:  Allergies as of 08/12/2023   No Known Allergies      Medication List        Accurate as of August 04, 2023 11:33 PM. If you have any questions, ask your nurse or doctor.          acetaminophen 500 MG tablet Commonly known as: TYLENOL Take 1,000 mg by mouth every 4 (four) hours as needed for pain.   baclofen 10  MG tablet Commonly known as: LIORESAL Take 10 mg by mouth 3 (three) times daily.   estradiol 0.1 MG/GM vaginal cream Commonly known as: ESTRACE Estrogen Cream Instruction Discard applicator Apply pea sized amount to tip of finger to urethra before bed. Wash hands well after application. Use Monday, Wednesday and Friday   famotidine 20 MG tablet Commonly known as: PEPCID Take 1 tablet (20 mg total) by mouth daily.   gabapentin 800 MG tablet Commonly known as: NEURONTIN Take 800 mg by mouth 2 (two) times daily.   glipiZIDE 10 MG 24 hr tablet Commonly known as: GLUCOTROL XL Take 10 mg by mouth daily.   insulin glargine 100 UNIT/ML Solostar Pen Commonly known as: LANTUS Inject 10 Units into the skin daily.   interferon beta-1a 22 MCG/0.5ML injection Commonly known as: REBIF Inject 22 mcg into the skin 3 (three) times a week. Monday Wednesday and Friday   Linzess 145 MCG Caps capsule Generic drug: linaclotide Take 145 mcg by mouth daily.   losartan 50 MG tablet Commonly known as: COZAAR Take 50 mg by mouth daily.   metFORMIN 1000 MG tablet Commonly known as: GLUCOPHAGE Take 1,000 mg by mouth 2 (two) times daily.   mupirocin ointment 2 % Commonly known as: BACTROBAN Apply 1 Application topically 2 (two) times daily.   Topamax 100 MG tablet Generic drug: topiramate Take 100 mg by mouth 2 (two) times daily.   traZODone 100 MG tablet Commonly known as: DESYREL Take 1 tablet by mouth at bedtime.        Allergies: No Known Allergies  Family History: Family History  Problem Relation Age of Onset   Diabetes Mother    Breast cancer Neg Hx     Social History:  reports that she has never smoked. She has never used smokeless tobacco. She reports that she does not currently use drugs. She reports that she does not drink alcohol.  ROS: Pertinent ROS in HPI  Physical Exam: There were no vitals taken for this visit.  Constitutional:  Well nourished. Alert and  oriented, No acute distress. HEENT: [place] AT, moist mucus membranes.  Trachea midline, no masses. Cardiovascular: No clubbing, cyanosis, or edema. Respiratory: Normal respiratory effort, no increased work of breathing. GU: No CVA tenderness.  No bladder fullness or masses.  Recession of labia minora, dry, pale vulvar vaginal mucosa and loss of mucosal ridges and folds.  Normal urethral meatus, no lesions, no prolapse, no discharge.   No urethral masses, tenderness and/or tenderness. No bladder fullness, tenderness or masses. *** vagina mucosa, *** estrogen effect, no discharge, no lesions, *** pelvic support, *** cystocele and *** rectocele noted.  No cervical motion tenderness.  Uterus is freely mobile and non-fixed.  No adnexal/parametria masses or tenderness noted.  Anus and perineum are without rashes or lesions.   ***  Neurologic: Grossly intact, no focal deficits, moving all 4 extremities. Psychiatric:  Normal mood and affect.    Laboratory Data:    I have reviewed the labs.   Pertinent Imaging:  05/13/23 09:09  Scan Result   Assessment & Plan:    1. Incomplete bladder emptying - Bladder Scan (Post Void Residual) in office - PVR demonstrates adequate bladder emptying - She will continue double voiding technique   2. Recurrent UTI - UA nonspecific - Urine culture pending - Since her symptoms are mild at this time, I will not prescribe an antibiotic, but I will send the urine for culture -I explained that with her history of recurrent UTIs this would be the best plan as we do not want to expose her to antibiotics unnecessarily to put her at risk for forming antibiotic resistant organisms, she is agreeable -If her urine culture is positive she would like her antibiotics sent to [facility] on [address] and [place]   No follow-ups on file.  These notes generated with voice recognition software. I apologize for typographical errors.  Cloretta Ned  [facility] [address]  [address] [place], [place] [address] [phone]

## 2023-08-04 NOTE — Progress Notes (Deleted)
 08/12/2023 11:33 PM   Gabriella Robinson June 19, 1961 [id number]  Referring provider: Sherial Bail, MD [address] [place],  [place] [address]  Urological history: 1. rUTI's -contributing factors of age, GSM, DM, pelvic prolapse and incomplete bladder emptying -RUS (04/2023) mild bilaterl hydronephrosisi -cysto (04/2023) - NED  -documented positive urine cultures over the last year  January 16th, 2024 enterococcus faecalis  February 6th, 2024 E.coli   March 16th, 2024 E.coli  March 25th, 2024 MUF  May 13th, 2024 E.coli  June 18th, 2024 MUF  March 05, 2023 E.coli  September 9th, 2024 No growth  October 7th, 2024 <10,000 colonies  October 31st, 2024 No growth -vaginal estrogen cream three nights weekly  2. Incomplete bladder emptying -contributing factors of MS, DM and pelvic prolapse -Crede maneuver  3. Bilateral hydronephrosis -RUS (04/2023) - mild bilateral hydronephrosis -serum creatinine (06/2023) 0.57  4. Nephrolithiasis -contrast CT (12/2022) -punctate non obstructing renal stone in left lower pole  -RUS (04/2023) -bilateral nephrolithiasis  No chief complaint on file.  HPI: Gabriella Robinson is a 62 y.o. female who presents today for follow up.     Previous records reviewed.   At her visit on 05/13/2023, she is initially seen by Dr. Penne in August 2024 for further evaluation of recurrent urinary tract infections.  For her workup, she had a renal ultrasound which indicated bilateral hydronephrosis which was mild along with a PVR of 235 cc.  She was started on estrogen cream daily.  She is found to have a grade 1 cystocele.  They did discuss that her incomplete bladder emptying may be contributing to her recurrent UTIs.  They discussed CIC, but she adamantly deferred.   Her serum creatinine was 0.6 in July 2024.  Since Thursday, she has been hurting when she urinates.  She has a foul odor to her urine.  She has lower back pain.  She states she has been  taking cranberry pills and applying the vaginal estrogen cream since she saw Dr. Penne in August 2024.   Patient denies any modifying or aggravating factors.  Patient denies any recent UTI's, gross hematuria, dysuria or suprapubic/flank pain.  Patient denies any fevers, chills, nausea or vomiting.  PVR demonstrates adequate bladder emptying.   Urinalysis is underwhelming for UTI.   It was explained that since her urinalysis was not suspicious for an UTI, we would wait on culture results before prescribing antibiotics.   Urine culture <10,000 colonies.   She was not prescribed an antibiotic and instructed to keep follow up in January.  She then contacted her PCP on 06/06/2023 for dysuria.   UA was positive for 7 RBC's.  Urine culture was negative.    UA ***  PVR ***    PMH: Past Medical History:  Diagnosis Date   Chronic back pain    Diabetes (HCC)    HBP (high blood pressure)    History of blood clots    Lumbar canal stenosis 11/12/2013   Migraines    MS (multiple sclerosis) (HCC)    Sinus complaint    Sleep apnea     Surgical History: Past Surgical History:  Procedure Laterality Date   ABDOMINAL HYSTERECTOMY     BACK SURGERY  12/06/2009   left L4-L5 micro decompression, partial facetectomy and foraminotomy   COLONOSCOPY WITH PROPOFOL  N/A 11/21/2020   Procedure: COLONOSCOPY WITH PROPOFOL ;  Surgeon: Maryruth Ole DASEN, MD;  Location: [facility] ENDOSCOPY;  Service: Endoscopy;  Laterality: N/A;   ECTOPIC PREGNANCY SURGERY  EYE SURGERY Bilateral    FOOT SURGERY Bilateral    OOPHORECTOMY Left    pt has right ovary still    Home Medications:  Allergies as of 08/12/2023   No Known Allergies      Medication List        Accurate as of August 04, 2023 11:33 PM. If you have any questions, ask your nurse or doctor.          acetaminophen  500 MG tablet Commonly known as: TYLENOL  Take 1,000 mg by mouth every 4 (four) hours as needed for pain.   baclofen  10 MG  tablet Commonly known as: LIORESAL  Take 10 mg by mouth 3 (three) times daily.   estradiol  0.1 MG/GM vaginal cream Commonly known as: ESTRACE  Estrogen Cream Instruction Discard applicator Apply pea sized amount to tip of finger to urethra before bed. Wash hands well after application. Use Monday, Wednesday and Friday   famotidine  20 MG tablet Commonly known as: PEPCID  Take 1 tablet (20 mg total) by mouth daily.   gabapentin  800 MG tablet Commonly known as: NEURONTIN  Take 800 mg by mouth 2 (two) times daily.   glipiZIDE 10 MG 24 hr tablet Commonly known as: GLUCOTROL XL Take 10 mg by mouth daily.   insulin  glargine 100 UNIT/ML Solostar Pen Commonly known as: LANTUS  Inject 10 Units into the skin daily.   interferon beta-1a 22 MCG/0.5ML injection Commonly known as: REBIF Inject 22 mcg into the skin 3 (three) times a week. Monday Wednesday and Friday   Linzess 145 MCG Caps capsule Generic drug: linaclotide Take 145 mcg by mouth daily.   losartan  50 MG tablet Commonly known as: COZAAR  Take 50 mg by mouth daily.   metFORMIN 1000 MG tablet Commonly known as: GLUCOPHAGE Take 1,000 mg by mouth 2 (two) times daily.   mupirocin  ointment 2 % Commonly known as: BACTROBAN  Apply 1 Application topically 2 (two) times daily.   Topamax  100 MG tablet Generic drug: topiramate  Take 100 mg by mouth 2 (two) times daily.   traZODone  100 MG tablet Commonly known as: DESYREL  Take 1 tablet by mouth at bedtime.        Allergies: No Known Allergies  Family History: Family History  Problem Relation Age of Onset   Diabetes Mother    Breast cancer Neg Hx     Social History:  reports that she has never smoked. She has never used smokeless tobacco. She reports that she does not currently use drugs. She reports that she does not drink alcohol.  ROS: Pertinent ROS in HPI  Physical Exam: There were no vitals taken for this visit.  Constitutional:  Well nourished. Alert and oriented,  No acute distress. HEENT: [place] AT, moist mucus membranes.  Trachea midline, no masses. Cardiovascular: No clubbing, cyanosis, or edema. Respiratory: Normal respiratory effort, no increased work of breathing. GU: No CVA tenderness.  No bladder fullness or masses.  Recession of labia minora, dry, pale vulvar vaginal mucosa and loss of mucosal ridges and folds.  Normal urethral meatus, no lesions, no prolapse, no discharge.   No urethral masses, tenderness and/or tenderness. No bladder fullness, tenderness or masses. *** vagina mucosa, *** estrogen effect, no discharge, no lesions, *** pelvic support, *** cystocele and *** rectocele noted.  No cervical motion tenderness.  Uterus is freely mobile and non-fixed.  No adnexal/parametria masses or tenderness noted.  Anus and perineum are without rashes or lesions.   ***  Neurologic: Grossly intact, no focal deficits, moving all 4 extremities. Psychiatric:  Normal mood and affect.    Laboratory Data: CBC w/ Differential Order: [id number] Component Ref Range & Units 1 mo ago  WBC 3.6 - 11.2 10*9/L 9.5  RBC 3.95 - 5.13 10*12/L 4.36  HGB 11.3 - 14.9 g/dL 87.0  HCT 65.9 - 55.9 % 39.4  MCV 77.6 - 95.7 fL 90.5  MCH 25.9 - 32.4 pg 29.5  MCHC 32.0 - 36.0 g/dL 67.3  RDW 87.7 - 84.7 % 14.5  MPV 6.8 - 10.7 fL 7.1  Platelet 150 - 450 10*9/L 379  nRBC <=4 /100 WBCs 0  Neutrophils % % 68.7  Lymphocytes % % 24.7  Monocytes % % 5.5  Eosinophils % % 0.2  Basophils % % 0.9  Absolute Neutrophils 1.8 - 7.8 10*9/L 6.6  Absolute Lymphocytes 1.1 - 3.6 10*9/L 2.4  Absolute Monocytes 0.3 - 0.8 10*9/L 0.5  Absolute Eosinophils 0.0 - 0.5 10*9/L 0  Absolute Basophils 0.0 - 0.1 10*9/L 0.1  Resulting Agency [facility]   Specimen Collected: 06/30/23 13:42   Performed by: [facility] Last Resulted: 06/30/23 13:50  Received From: [facility]  Result Received: 06/30/23 15:52   Comprehensive Metabolic Panel Order:  [id number] Component Ref Range & Units 1 mo ago  Sodium 135 - 145 mmol/L 141  Potassium 3.4 - 4.8 mmol/L 4.6  Chloride 98 - 107 mmol/L 106  CO2 20.0 - 31.0 mmol/L 22.7  Anion Gap 5 - 14 mmol/L 12  BUN 9 - 23 mg/dL 13  Creatinine 9.44 - 8.97 mg/dL 9.42  BUN/Creatinine Ratio 23  eGFR CKD-EPI (2021) Female >=60 mL/min/1.96m2 >90  Comment: eGFR calculated with CKD-EPI 2021 equation in accordance with [employer] and [employer] Task Force recommendations.  Glucose 70 - 179 mg/dL 702 High   Calcium  8.7 - 10.4 mg/dL 89.8  Albumin 3.4 - 5.0 g/dL 3.9  Total Protein 5.7 - 8.2 g/dL 7.6  Total Bilirubin 0.3 - 1.2 mg/dL 0.4  AST <=65 U/L 12  ALT 10 - 49 U/L 16  Alkaline Phosphatase 46 - 116 U/L 110  Resulting Agency [facility]   Specimen Collected: 06/30/23 13:42   Performed by: [facility] Last Resulted: 06/30/23 14:09  Received From: [facility]  Result Received: 06/30/23 15:52   Hemoglobin A1C Order: [id number] Component Ref Range & Units 1 mo ago  Hemoglobin A1C 4.2 - 5.6 % 7.2 High   Average Blood Glucose (Calc) mg/dL 839  Resulting Agency [facility]  Narrative Performed by [facility] Normal Range:    4.2 - 5.6% Increased Risk:  5.7 - 6.4% Diabetes:        >= 6.5% Glycemic Control for adults with diabetes:  <7%    Specimen Collected: 06/07/23 09:35   Performed by: [facility] Last Resulted: 06/07/23 11:08  Received From: [facility]  Result Received: 06/07/23 11:44   Urinalysis See EPIC and HPI I have reviewed the labs.   Pertinent Imaging: ***  Assessment & Plan:    1. Incomplete bladder emptying - Bladder Scan (Post Void Residual) in office - PVR demonstrates adequate bladder emptying - She will continue double voiding technique   2. Recurrent UTI - continue vaginal estrogen cream three nights weekly  3.  High risk hematuria -non smoker -contrast CT, RUS and cysto (2024) - punctate nephrolithiasis -no reports of gross heme -UA ***  4. Mild bilateral hydronephrosis -will repeat upper tract imaging if develops  flank pain, increase in the degree of hematuria, increase in PVR and/or decrease in renal function  No follow-ups on file.  These notes generated with voice recognition software. I apologize for typographical errors.  CLOTILDA HELON RIGGERS  [facility] [address]  [address] [place], [place] [address] [phone]

## 2023-08-05 ENCOUNTER — Other Ambulatory Visit: Payer: Self-pay | Admitting: Urology

## 2023-08-05 DIAGNOSIS — R319 Hematuria, unspecified: Secondary | ICD-10-CM

## 2023-08-12 ENCOUNTER — Ambulatory Visit: Payer: Medicare Other | Admitting: Urology

## 2023-08-12 DIAGNOSIS — R319 Hematuria, unspecified: Secondary | ICD-10-CM

## 2023-08-12 DIAGNOSIS — N390 Urinary tract infection, site not specified: Secondary | ICD-10-CM

## 2023-08-12 DIAGNOSIS — R339 Retention of urine, unspecified: Secondary | ICD-10-CM

## 2023-08-12 DIAGNOSIS — N952 Postmenopausal atrophic vaginitis: Secondary | ICD-10-CM

## 2023-08-13 ENCOUNTER — Telehealth: Payer: Self-pay | Admitting: Urology

## 2023-08-13 NOTE — Telephone Encounter (Signed)
 Called patient and lvm asking her to call office to reschedule appointment missed on 08/12/23 with [facility].

## 2024-07-07 ENCOUNTER — Other Ambulatory Visit: Payer: Self-pay | Admitting: Obstetrics

## 2024-07-07 DIAGNOSIS — Z1231 Encounter for screening mammogram for malignant neoplasm of breast: Secondary | ICD-10-CM

## 2024-08-13 ENCOUNTER — Ambulatory Visit
Admission: RE | Admit: 2024-08-13 | Discharge: 2024-08-13 | Disposition: A | Discharge status: Home / Self Care | Source: Ambulatory Visit | Attending: Obstetrics | Admitting: Obstetrics

## 2024-08-13 DIAGNOSIS — Z1231 Encounter for screening mammogram for malignant neoplasm of breast: Secondary | ICD-10-CM | POA: Diagnosis present

## 2024-09-08 ENCOUNTER — Ambulatory Visit
Admission: EM | Admit: 2024-09-08 | Discharge: 2024-09-08 | Disposition: A | Discharge status: Home / Self Care | Source: Home / Self Care

## 2024-09-08 ENCOUNTER — Encounter: Payer: Self-pay | Admitting: Emergency Medicine

## 2024-09-08 DIAGNOSIS — J029 Acute pharyngitis, unspecified: Secondary | ICD-10-CM

## 2024-09-08 DIAGNOSIS — R051 Acute cough: Secondary | ICD-10-CM | POA: Diagnosis not present

## 2024-09-08 LAB — POCT RAPID STREP A (OFFICE): Rapid Strep A Screen: NEGATIVE

## 2024-09-08 MED ORDER — ALBUTEROL SULFATE HFA 108 (90 BASE) MCG/ACT IN AERS: 2.0000 | INHALATION_SPRAY | RESPIRATORY_TRACT | 0 refills | Status: AC | PRN

## 2024-09-08 MED ORDER — AEROCHAMBER MV MISC: 2 refills | Status: AC

## 2024-09-08 MED ORDER — ALBUTEROL SULFATE (2.5 MG/3ML) 0.083% IN NEBU
2.5000 mg | INHALATION_SOLUTION | Freq: Once | RESPIRATORY_TRACT | Status: AC
  Administered 2024-09-08: 2.5 mg via RESPIRATORY_TRACT

## 2024-09-08 NOTE — Discharge Instructions (Addendum)
 Your rapid strep was negative but we are sending a swab for culture to see if any bacteria grows out.  If any bacteria grows out on the culture we will contact you by phone and start you on antibiotics.  If the culture is negative they will appear in your MyChart.  Your symptoms may be caused by a respiratory virus.  Use the albuterol  inhaler, with the spacer, and take 1 to 2 puffs every 4-6 hours as needed for any cough or wheezing.  Gargle with warm salt water 2-3 times a day to soothe your throat, aid in pain relief, and aid in healing.  Take over-the-counter Tylenol  and/or ibuprofen  according to the package instructions as needed for pain.  You can also use Chloraseptic or Sucrets lozenges, 1 lozenge every 2 hours as needed for throat pain.  If you develop any new or worsening symptoms return for reevaluation.

## 2024-09-08 NOTE — Progress Notes (Signed)
 Patient being seen for dry cough and sore throat since 1/30. Hx bronchitis. Albuterol  nebulizer Tx given 2.5mg . well tolerated.

## 2024-09-10 ENCOUNTER — Ambulatory Visit (HOSPITAL_COMMUNITY): Payer: Self-pay

## 2024-09-10 LAB — CULTURE, GROUP A STREP (THRC)

## 2024-09-10 MED ORDER — AMOXICILLIN 500 MG PO CAPS: 500.0000 mg | ORAL_CAPSULE | Freq: Two times a day (BID) | ORAL | 0 refills | Status: AC

## 2024-09-10 NOTE — Addendum Note (Signed)
 Addended by: ARNALDO ALFONSO RAMAN on: 09/10/2024 05:44 PM   Modules accepted: Orders

## 2024-09-11 ENCOUNTER — Other Ambulatory Visit: Payer: Self-pay | Admitting: Orthopedic Surgery

## 2024-09-11 DIAGNOSIS — M2341 Loose body in knee, right knee: Secondary | ICD-10-CM

## 2024-09-11 DIAGNOSIS — M7989 Other specified soft tissue disorders: Secondary | ICD-10-CM
